# Patient Record
Sex: Male | Born: 1980 | Race: White | Hispanic: No | Marital: Single | Smoking: Former smoker
Health system: Southern US, Community
[De-identification: ages and names within clinical notes are randomized; demographics above are authoritative.]

## PROBLEM LIST (undated history)

## (undated) DIAGNOSIS — F209 Schizophrenia, unspecified: Secondary | ICD-10-CM

## (undated) DIAGNOSIS — F061 Catatonic disorder due to known physiological condition: Secondary | ICD-10-CM

---

## 2010-06-15 ENCOUNTER — Inpatient Hospital Stay (HOSPITAL_COMMUNITY)
Admission: RE | Admit: 2010-06-15 | Discharge: 2010-06-22 | DRG: 885 | Disposition: A | Discharge status: Home / Self Care | Payer: PRIVATE HEALTH INSURANCE | Source: Ambulatory Visit | Attending: Psychiatry | Admitting: Psychiatry

## 2010-06-15 ENCOUNTER — Emergency Department (HOSPITAL_BASED_OUTPATIENT_CLINIC_OR_DEPARTMENT_OTHER)
Admission: EM | Admit: 2010-06-15 | Discharge: 2010-06-15 | Disposition: A | Discharge status: Other Acute Inpatient Hospital | Payer: Self-pay | Attending: Emergency Medicine | Admitting: Emergency Medicine

## 2010-06-15 ENCOUNTER — Emergency Department (INDEPENDENT_AMBULATORY_CARE_PROVIDER_SITE_OTHER): Payer: PRIVATE HEALTH INSURANCE

## 2010-06-15 DIAGNOSIS — R45851 Suicidal ideations: Secondary | ICD-10-CM

## 2010-06-15 DIAGNOSIS — R443 Hallucinations, unspecified: Secondary | ICD-10-CM

## 2010-06-15 DIAGNOSIS — F29 Unspecified psychosis not due to a substance or known physiological condition: Secondary | ICD-10-CM | POA: Insufficient documentation

## 2010-06-15 DIAGNOSIS — H5316 Psychophysical visual disturbances: Secondary | ICD-10-CM | POA: Insufficient documentation

## 2010-06-15 DIAGNOSIS — Z0289 Encounter for other administrative examinations: Secondary | ICD-10-CM

## 2010-06-15 DIAGNOSIS — R51 Headache: Secondary | ICD-10-CM

## 2010-06-15 LAB — DIFFERENTIAL
Lymphs Abs: 1.7 10*3/uL (ref 0.7–4.0)
Monocytes Absolute: 0.4 10*3/uL (ref 0.1–1.0)
Monocytes Relative: 6 % (ref 3–12)
Neutro Abs: 4.6 10*3/uL (ref 1.7–7.7)
Neutrophils Relative %: 67 % (ref 43–77)

## 2010-06-15 LAB — ETHANOL: Alcohol, Ethyl (B): <10 mg/dL (ref 0–10)

## 2010-06-15 LAB — COMPREHENSIVE METABOLIC PANEL
AST: 18 U/L (ref 0–37)
BUN: 16 mg/dL (ref 6–23)
CO2: 27 mEq/L (ref 19–32)
Calcium: 9.5 mg/dL (ref 8.4–10.5)
Chloride: 104 mEq/L (ref 96–112)
Creatinine, Ser: 1.1 mg/dL (ref 0.4–1.5)
GFR calc non Af Amer: >60 mL/min (ref >60)
Glucose, Bld: 90 mg/dL (ref 70–99)
Total Bilirubin: 1.4 mg/dL — ABNORMAL HIGH (ref 0.3–1.2)

## 2010-06-15 LAB — URINALYSIS, ROUTINE W REFLEX MICROSCOPIC
Glucose, UA: NEGATIVE mg/dL
Protein, ur: NEGATIVE mg/dL
Specific Gravity, Urine: 1.009 (ref 1.005–1.030)

## 2010-06-15 LAB — POCT TOXICOLOGY PANEL

## 2010-06-15 LAB — CBC
HCT: 44.9 % (ref 39.0–52.0)
Hemoglobin: 15.5 g/dL (ref 13.0–17.0)
MCH: 29.5 pg (ref 26.0–34.0)
MCHC: 34.5 g/dL (ref 30.0–36.0)
MCV: 85.4 fL (ref 78.0–100.0)
RBC: 5.26 MIL/uL (ref 4.22–5.81)

## 2010-06-16 DIAGNOSIS — F29 Unspecified psychosis not due to a substance or known physiological condition: Secondary | ICD-10-CM

## 2010-06-16 NOTE — H&P (Signed)
NAME:  Tyrone Jenkins, Tyrone Jenkins             ACCOUNT NO.:  [id number]  MEDICAL RECORD NO.:  [id number]           PATIENT TYPE:  I  LOCATION:  0404                          FACILITY:  [facility]  PHYSICIAN:  Eulogio Ditch, MD DATE OF BIRTH:  1981-02-03  DATE OF ADMISSION:  06/15/2010 DATE OF DISCHARGE:                      PSYCHIATRIC ADMISSION ASSESSMENT   HISTORY OF PRESENT ILLNESS:  A 30 year old white male with no past psych hospitalization or history of suicide attempt, not on any psych medications before admission, was admitted because of disorganized behavior for the last 2 months.  The patient was also hearing voices telling him to kill self, telling him that there is no sense of living now.  The patient also reported having racing thoughts and was unable to sleep because of the racing thoughts and the voices.  The patient also reports anxiety and panic attacks sometimes.  The patient was also paranoid and fearful of tow trucks, police, and fire trucks.  The patient also reported visual hallucinations.  On asking about suicidal ideations, he told me that since 2 weeks the voices are telling him to kill self, so he is very fearful about that.  The patient told me that it was very stressful to move from [place] to [place].  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  The patient has also ongoing conflict with the wife.  They have 2 kids, 77 and 31 years old. 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  The patient denies abuse of any drugs.  A UDS was negative at the time of admission.  Denied any history of trauma to the head.  SUBSTANCE ABUSE HISTORY:  None reported.  PAST PSYCH HOSPITALIZATIONS:  None.  CURRENT MEDICAL PROBLEMS:  None.  ALLERGIES:  No known drug allergies.  PHYSICAL EXAMINATION:  Done in Emergency at [facility], within normal limits.  DIAGNOSTIC DATA:  Labs within normal limits.  CT scan was negative.  MENTAL STATUS  EXAM:  The patient is calm, cooperative during the interview.  Fair eye contact.  Was able to make logical conversation. Affect constricted.  Mood depressed.  Thought process:  Logical and goal- directed.  Thought content:  Paranoid delusions positive, on hallucinations telling him to kill self years, fearful of hurting himself.  Cognition:  Alert, awake, oriented x3.  Memory:  Immediate and recent remote fair.  Attention and concentration:  Fair.  Abstract ability:  Fair to poor.  Insight and judgment:  Fair to poor.  DIAGNOSES:  Axis I:  Psychosis not otherwise specified.  Rule out schizophreniform disorder.  Rule out bipolar disorder mixed type with psychotic symptoms. Axis II:  Deferred. Axis III:  No medical issue. Axis IV:  Conflict with the wife.  Stress at the job. Axis V:  GAF 30.  TREATMENT PLAN: 1. The patient was started on Risperdal 1 mg twice a day. 2. I will also start the patient on Klonopin 1 mg twice a day. 3. Estimated length of stay in the hospital will be 4-5 days. 4. We will get more collateral information on this patient.     Eulogio Ditch, MD  SA/MEDQ  D:  06/16/2010  T:  06/16/2010  Job:  [id number]  Electronically Signed by Eulogio Ditch  on 06/16/2010 02:47:37 PM

## 2010-06-18 LAB — LIPID PANEL
Cholesterol: 168 mg/dL (ref 0–200)
HDL: 32 mg/dL — ABNORMAL LOW (ref >39)
LDL Cholesterol: 91 mg/dL (ref 0–99)

## 2010-06-21 DIAGNOSIS — F29 Unspecified psychosis not due to a substance or known physiological condition: Secondary | ICD-10-CM
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
## 2014-01-07 ENCOUNTER — Encounter (HOSPITAL_COMMUNITY): Payer: Self-pay | Admitting: Radiology

## 2014-01-07 ENCOUNTER — Emergency Department (HOSPITAL_COMMUNITY)
Admission: EM | Admit: 2014-01-07 | Discharge: 2014-01-07 | Disposition: A | Discharge status: Home / Self Care | Payer: PRIVATE HEALTH INSURANCE | Attending: Emergency Medicine | Admitting: Emergency Medicine

## 2014-01-07 ENCOUNTER — Emergency Department (HOSPITAL_COMMUNITY): Payer: PRIVATE HEALTH INSURANCE

## 2014-01-07 DIAGNOSIS — R4182 Altered mental status, unspecified: Secondary | ICD-10-CM | POA: Insufficient documentation

## 2014-01-07 DIAGNOSIS — R4789 Other speech disturbances: Secondary | ICD-10-CM | POA: Insufficient documentation

## 2014-01-07 LAB — RAPID URINE DRUG SCREEN, HOSP PERFORMED
Amphetamines: NOT DETECTED
BARBITURATES: NOT DETECTED
Benzodiazepines: POSITIVE — AB
COCAINE: NOT DETECTED
Opiates: NOT DETECTED
TETRAHYDROCANNABINOL: NOT DETECTED

## 2014-01-07 LAB — COMPREHENSIVE METABOLIC PANEL
ALBUMIN: 4.7 g/dL (ref 3.5–5.2)
ALK PHOS: 71 U/L (ref 39–117)
ALT: 23 U/L (ref 0–53)
ANION GAP: 18 — AB (ref 5–15)
AST: 23 U/L (ref 0–37)
BILIRUBIN TOTAL: 1.4 mg/dL — AB (ref 0.3–1.2)
BUN: 21 mg/dL (ref 6–23)
CHLORIDE: 95 meq/L — AB (ref 96–112)
CO2: 24 meq/L (ref 19–32)
CREATININE: 1.06 mg/dL (ref 0.50–1.35)
Calcium: 9.8 mg/dL (ref 8.4–10.5)
GFR calc Af Amer: >90 mL/min (ref >90)
GLUCOSE: 87 mg/dL (ref 70–99)
POTASSIUM: 3.7 meq/L (ref 3.7–5.3)
Sodium: 137 mEq/L (ref 137–147)
Total Protein: 8 g/dL (ref 6.0–8.3)

## 2014-01-07 LAB — CBC WITH DIFFERENTIAL/PLATELET
BASOS PCT: 0 % (ref 0–1)
Basophils Absolute: 0 10*3/uL (ref 0.0–0.1)
Eosinophils Absolute: 0.1 10*3/uL (ref 0.0–0.7)
Eosinophils Relative: 1 % (ref 0–5)
HEMATOCRIT: 46 % (ref 39.0–52.0)
HEMOGLOBIN: 16 g/dL (ref 13.0–17.0)
LYMPHS ABS: 2.8 10*3/uL (ref 0.7–4.0)
LYMPHS PCT: 28 % (ref 12–46)
MCH: 30.7 pg (ref 26.0–34.0)
MCHC: 34.8 g/dL (ref 30.0–36.0)
MCV: 88.3 fL (ref 78.0–100.0)
MONO ABS: 0.9 10*3/uL (ref 0.1–1.0)
MONOS PCT: 9 % (ref 3–12)
NEUTROS ABS: 6.2 10*3/uL (ref 1.7–7.7)
NEUTROS PCT: 62 % (ref 43–77)
Platelets: 279 10*3/uL (ref 150–400)
RBC: 5.21 MIL/uL (ref 4.22–5.81)
RDW: 13.5 % (ref 11.5–15.5)
WBC: 10 10*3/uL (ref 4.0–10.5)

## 2014-01-07 LAB — URINALYSIS, ROUTINE W REFLEX MICROSCOPIC
Glucose, UA: NEGATIVE mg/dL
Leukocytes, UA: NEGATIVE
NITRITE: NEGATIVE
Protein, ur: 30 mg/dL — AB
SPECIFIC GRAVITY, URINE: 1.038 — AB (ref 1.005–1.030)
UROBILINOGEN UA: 1 mg/dL (ref 0.0–1.0)
pH: 5.5 (ref 5.0–8.0)

## 2014-01-07 LAB — ETHANOL: Alcohol, Ethyl (B): <11 mg/dL (ref 0–11)

## 2014-01-07 LAB — I-STAT CG4 LACTIC ACID, ED: Lactic Acid, Venous: 0.8 mmol/L (ref 0.5–2.2)

## 2014-01-07 LAB — AMMONIA: AMMONIA: 10 umol/L — AB (ref 11–60)

## 2014-01-07 LAB — URINE MICROSCOPIC-ADD ON

## 2014-01-07 MED ORDER — SODIUM CHLORIDE 0.9 % IV BOLUS (SEPSIS)
1000.0000 mL | Freq: Once | INTRAVENOUS | Status: AC
  Administered 2014-01-07: 1000 mL via INTRAVENOUS

## 2014-01-07 MED ORDER — SODIUM CHLORIDE 0.9 % IV SOLN
INTRAVENOUS | Status: DC
  Administered 2014-01-07: 19:00:00 via INTRAVENOUS

## 2014-01-07 MED ORDER — ACETAMINOPHEN 325 MG PO TABS
650.0000 mg | ORAL_TABLET | Freq: Once | ORAL | Status: AC
  Administered 2014-01-07: 650 mg via ORAL
  Filled 2014-01-07: qty 2

## 2014-01-07 NOTE — ED Notes (Signed)
Bed: [facility] Expected date:  Expected time:  Means of arrival:  Comments: [facility] 
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
## 2014-01-07 NOTE — ED Notes (Signed)
Upon going in room to discharge pt, pt requested to speak to Dr Freida BusmanAllen prior to discharge. Dr Freida BusmanAllen went in room, per Dr Freida BusmanAllen pt stated he was ready to be discharged. When nurse went in room, pt was very agitated, refused to sign for paperwork, and states he's upset because he doesn't know what's going on and he was told by his girlfriend that he was going to be admitted over night.

## 2014-01-07 NOTE — ED Notes (Signed)
Pt answering some questions at this time, when asked if he knows where he is he says "yes," but is unable to specify where he he. When asked about pain pt states "my whole body hurts." Pt not answering questions r/t situation/ what happened today. Pt's girlfriend at bedside, states that he went in to work this morning, boss sent him home because "he was acting out of it." When she went home to check on him, he was very tense, not answering any questions. Girlfriend states pt has "been acting out of it for the past few days."

## 2014-01-07 NOTE — Discharge Instructions (Signed)
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
## 2014-01-18 ENCOUNTER — Encounter (HOSPITAL_COMMUNITY): Payer: Self-pay | Admitting: Emergency Medicine

## 2014-01-18 ENCOUNTER — Emergency Department (HOSPITAL_COMMUNITY)
Admission: EM | Admit: 2014-01-18 | Discharge: 2014-01-19 | Disposition: A | Discharge status: Other Acute Inpatient Hospital | Payer: Self-pay | Attending: Emergency Medicine | Admitting: Emergency Medicine

## 2014-01-18 DIAGNOSIS — F209 Schizophrenia, unspecified: Secondary | ICD-10-CM

## 2014-01-18 DIAGNOSIS — F3130 Bipolar disorder, current episode depressed, mild or moderate severity, unspecified: Secondary | ICD-10-CM

## 2014-01-18 DIAGNOSIS — F315 Bipolar disorder, current episode depressed, severe, with psychotic features: Secondary | ICD-10-CM | POA: Diagnosis present

## 2014-01-18 DIAGNOSIS — Z79899 Other long term (current) drug therapy: Secondary | ICD-10-CM | POA: Insufficient documentation

## 2014-01-18 HISTORY — DX: Schizophrenia, unspecified: F20.9

## 2014-01-18 LAB — CBC WITH DIFFERENTIAL/PLATELET
BASOS ABS: 0 10*3/uL (ref 0.0–0.1)
Basophils Relative: 0 % (ref 0–1)
Eosinophils Absolute: 0.1 10*3/uL (ref 0.0–0.7)
Eosinophils Relative: 1 % (ref 0–5)
HEMATOCRIT: 40.9 % (ref 39.0–52.0)
Hemoglobin: 14.2 g/dL (ref 13.0–17.0)
LYMPHS ABS: 2.6 10*3/uL (ref 0.7–4.0)
LYMPHS PCT: 29 % (ref 12–46)
MCH: 30 pg (ref 26.0–34.0)
MCHC: 34.7 g/dL (ref 30.0–36.0)
MCV: 86.3 fL (ref 78.0–100.0)
Monocytes Absolute: 1 10*3/uL (ref 0.1–1.0)
Monocytes Relative: 11 % (ref 3–12)
NEUTROS ABS: 5.5 10*3/uL (ref 1.7–7.7)
Neutrophils Relative %: 59 % (ref 43–77)
PLATELETS: 289 10*3/uL (ref 150–400)
RBC: 4.74 MIL/uL (ref 4.22–5.81)
RDW: 13.6 % (ref 11.5–15.5)
WBC: 9.2 10*3/uL (ref 4.0–10.5)

## 2014-01-18 LAB — COMPREHENSIVE METABOLIC PANEL
ALBUMIN: 4.1 g/dL (ref 3.5–5.2)
ALT: 22 U/L (ref 0–53)
ANION GAP: 15 (ref 5–15)
AST: 22 U/L (ref 0–37)
Alkaline Phosphatase: 60 U/L (ref 39–117)
BILIRUBIN TOTAL: 1 mg/dL (ref 0.3–1.2)
BUN: 16 mg/dL (ref 6–23)
CALCIUM: 9.3 mg/dL (ref 8.4–10.5)
CHLORIDE: 99 meq/L (ref 96–112)
CO2: 26 mEq/L (ref 19–32)
Creatinine, Ser: 0.9 mg/dL (ref 0.50–1.35)
GFR calc Af Amer: >90 mL/min (ref >90)
Glucose, Bld: 103 mg/dL — ABNORMAL HIGH (ref 70–99)
Potassium: 3.3 mEq/L — ABNORMAL LOW (ref 3.7–5.3)
Sodium: 140 mEq/L (ref 137–147)
Total Protein: 6.9 g/dL (ref 6.0–8.3)

## 2014-01-18 LAB — RAPID URINE DRUG SCREEN, HOSP PERFORMED
Amphetamines: NOT DETECTED
Barbiturates: NOT DETECTED
Benzodiazepines: NOT DETECTED
Cocaine: NOT DETECTED
Opiates: NOT DETECTED
TETRAHYDROCANNABINOL: NOT DETECTED

## 2014-01-18 LAB — ETHANOL: Alcohol, Ethyl (B): <11 mg/dL (ref 0–11)

## 2014-01-18 MED ORDER — ONDANSETRON HCL 4 MG PO TABS: 4.0000 mg | ORAL_TABLET | Freq: Three times a day (TID) | ORAL | Status: DC | PRN

## 2014-01-18 MED ORDER — ALUM & MAG HYDROXIDE-SIMETH 200-200-20 MG/5ML PO SUSP: 30.0000 mL | ORAL | Status: DC | PRN

## 2014-01-18 MED ORDER — ACETAMINOPHEN 325 MG PO TABS
650.0000 mg | ORAL_TABLET | ORAL | Status: DC | PRN
Start: 2014-01-18 — End: 2014-01-19

## 2014-01-18 MED ORDER — LORAZEPAM 1 MG PO TABS
1.0000 mg | ORAL_TABLET | Freq: Three times a day (TID) | ORAL | Status: DC | PRN
  Administered 2014-01-18 – 2014-01-19 (×2): 1 mg via ORAL
  Filled 2014-01-18 (×2): qty 1

## 2014-01-18 MED ORDER — POTASSIUM CHLORIDE CRYS ER 20 MEQ PO TBCR
40.0000 meq | EXTENDED_RELEASE_TABLET | Freq: Once | ORAL | Status: AC
  Administered 2014-01-18: 40 meq via ORAL
  Filled 2014-01-18: qty 2

## 2014-01-18 MED ORDER — RISPERIDONE 1 MG PO TABS
1.0000 mg | ORAL_TABLET | Freq: Every day | ORAL | Status: DC
  Administered 2014-01-18: 1 mg via ORAL
  Filled 2014-01-18: qty 1

## 2014-01-18 MED ORDER — IBUPROFEN 200 MG PO TABS: 600.0000 mg | ORAL_TABLET | Freq: Three times a day (TID) | ORAL | Status: DC | PRN

## 2014-01-18 MED ORDER — NICOTINE 21 MG/24HR TD PT24: 21.0000 mg | MEDICATED_PATCH | Freq: Every day | TRANSDERMAL | Status: DC

## 2014-01-18 NOTE — ED Provider Notes (Signed)
CSN: [id number]     Arrival date & time 01/18/14  0403 History   First MD Initiated Contact with Patient 01/18/14 [phone]     Chief Complaint  Patient presents with  . Medical Clearance    patient is in a catatonic state     (Consider location/radiation/quality/duration/timing/severity/associated sxs/prior Treatment) HPI 33 year old male presents to the emergency department with his mother who reports change in behavior.  Patient has history of schizophrenia 3 years ago, no longer on medications.  Patient will nod yes or no to questions.  Is noted to be tensing his arms and legs and moving his mouth without sounds occasionally gritting his teeth.  Patient nods his head yes when asked if he is sad.  He denies pain.  He denies being on medication.  Patient's mother reports symptoms started about 2 weeks ago when he was unable to see his children over the weekend.  His children live with his wife who is in [place].  Family went to visit him, and noticed that he was extremely emotional and overwrought and could not speak.  Patient went to work the next day, was sent home as he was unable to speak.  He is currently been on medical leave for the last 2 weeks due to his emotional state.  Mother reports that he is not eating well.  He intermittently speaks to family but only in limited amounts.  They attempted taken to get psychiatric help twice this week, but patient left the facility both times.  They attempted to get patient IVC'd, but the magistrate refused their petition.  Patient is not suicidal or homicidal.  Patient has been picked up by the sheriff's department twice this week once while walking in the rain, another time when he went to a prior residence of his and was trying to get into the house.  Mother reports similar episodes 3 years ago when he was diagnosed with schizophrenia.  She feels that he needs to get back on his medication. Past Medical History  Diagnosis Date  . Schizophrenia     History reviewed. No pertinent past surgical history. No family history on file. History  Substance Use Topics  . Smoking status: Never Smoker   . Smokeless tobacco: Not on file  . Alcohol Use: No    Review of Systems  Unable to perform ROS: Psychiatric disorder      Allergies  Review of patient's allergies indicates no known allergies.  Home Medications   Prior to Admission medications   Medication Sig Start Date End Date Taking? Authorizing Provider  Multiple Vitamin (MULTIVITAMIN WITH MINERALS) TABS tablet Take 1 tablet by mouth daily.   Yes Historical Provider, MD   BP 127/80  Pulse 93  Temp(Src) 98.1 F (36.7 C) (Oral)  Resp 18  SpO2 100% Physical Exam  Nursing note and vitals reviewed. Constitutional: He appears well-developed and well-nourished. He appears distressed.  HENT:  Head: Normocephalic and atraumatic.  Nose: Nose normal.  Mouth/Throat: Oropharynx is clear and moist.  Eyes: Conjunctivae and EOM are normal. Pupils are equal, round, and reactive to light.  Neck: Normal range of motion. Neck supple. No JVD present. No tracheal deviation present. No thyromegaly present.  Cardiovascular: Normal rate, regular rhythm, normal heart sounds and intact distal pulses.  Exam reveals no gallop and no friction rub.   No murmur heard. Pulmonary/Chest: Effort normal and breath sounds normal. No stridor. No respiratory distress. He has no wheezes. He has no rales. He exhibits no tenderness.  Abdominal: Soft. Bowel sounds are normal. He exhibits no distension and no mass. There is no tenderness. There is no rebound and no guarding.  Musculoskeletal: Normal range of motion. He exhibits no edema and no tenderness.  Lymphadenopathy:    He has no cervical adenopathy.  Neurological: He is alert. He displays normal reflexes. No cranial nerve deficit. He exhibits normal muscle tone. Coordination (patient is holding arms and legs rigidly.  Foot arms removed, they stayed in  position but with notable rigidity) abnormal.  Skin: Skin is warm and dry. No rash noted. No erythema. No pallor.  Psychiatric:  Patient with grinding of teeth, refuses to answer questions.  Able to answer yes or no questions by shaking head.  Reports depression.    ED Course  Procedures (including critical care time) Labs Review Labs Reviewed  COMPREHENSIVE METABOLIC PANEL - Abnormal; Notable for the following:    Potassium 3.3 (*)    Glucose, Bld 103 (*)    All other components within normal limits  CBC WITH DIFFERENTIAL  ETHANOL  URINE RAPID DRUG SCREEN (HOSP PERFORMED)    Imaging Review No results found.   EKG Interpretation None      MDM   Final diagnoses:  Schizophrenia, unspecified type  Adult behavior problems  Adjustment disorder with other symptom    33 year old male with persistent bizarre behavior over the last 2 weeks history of schizophrenia.  Will have patient evaluated by TTS and psychiatry  Olivia Mackielga M Barb Shear, MD 01/18/14 0600

## 2014-01-18 NOTE — ED Notes (Signed)
Pt nodding head yes or no when answering questions, having trouble when trying to speak, pt denies shortness of breath, denies headache or abdominal pain, pt nods yes to being sad, pt nods yes to having this happen before.

## 2014-01-18 NOTE — ED Notes (Signed)
Patient is unable to answer screening questions at this time.

## 2014-01-18 NOTE — ED Notes (Signed)
Pt wanded by security, 1 bag of pt belongings. Attempted to call report to [facility], stated will take pt after 7 am.

## 2014-01-18 NOTE — Progress Notes (Signed)
Pt has been assessed and meets inpatient criteria for treatment. Pt clinicals faxed out to:   [facility]  [facility]  [facility]  [place]  [facility]  [facility]   Will continue to seek placement.  Derrell Lollingoris Ziair Penson, MSW  Social Worker  [phone]

## 2014-01-18 NOTE — ED Notes (Signed)
Pt stated he was not having thoughts to harm others so he could go home now, also voiced feeling better. Ate all of dinner, less irritable. Maintained on q 15 minute checks.

## 2014-01-18 NOTE — ED Notes (Signed)
Patient arrives accompanied by his mother. Patient is not answering questions, is rigid, and in a catatonic like state. Patient's mother states the patient has had some increased emotional stress over the past 2 weeks in relation to his children's mother prohibiting him from seeing his children. Patient was seen on 01/07/2014 for similar episode. Patient has a history of schizophrenia and to the mother's knowledge patient is not taking any medications. Patient mother states on 01/07/2014 she was told he was dehydrated.

## 2014-01-18 NOTE — ED Notes (Signed)
Transfer note: [facility]

## 2014-01-18 NOTE — ED Notes (Signed)
Transfer note : [facility], report called . Pt has been experiencing catonia like symptoms since mom has been caring for  his kids. Pt has been pacing in hallway, nonverbal to staff. Refused prn Ativan, food and beverage. Maintain on q 15 minute checks

## 2014-01-18 NOTE — ED Notes (Signed)
Pt. In scrubs. Pt. And belongings searched and wanded by security. Pt. Has 1 belongings bag. Pt. Has sweat pants, t-shirts, flip flops. Pt. Belongings locked up at the nurses station in the yellow zone.

## 2014-01-18 NOTE — ED Notes (Signed)
Pt instructed to give urine sample, given urinal, pt able to sit on side of bed w/o difficulty and by self, pt still did not speak.

## 2014-01-18 NOTE — Consult Note (Signed)
[facility] Face-to-Face Psychiatry Consult   Reason for Consult:  [place] Referring Physician:  EDP  Edmund Holcomb is an 33 y.o. male. Total Time spent with patient: 45 minutes  Assessment: AXIS I:  Bipolar, Depressed AXIS II:  Deferred AXIS III:   Past Medical History  Diagnosis Date  . Schizophrenia    AXIS IV:  other psychosocial or environmental problems, problems related to social environment and problems with primary support group AXIS V:  21-30 behavior considerably influenced by delusions or hallucinations OR serious impairment in judgment, communication OR inability to function in almost all areas  Plan:  Recommend psychiatric Inpatient admission when medically cleared.  Subjective:   Lamont Tant is a 33 y.o. male patient admitted with bipolar with psychosis.  HPI:  The patient was brought to the ED by his mother and was in a catatonic state.  When we assessed him today, he was pacing and agitated, clearly responding to internal stimuli but denies this.  He does say he is having trouble talking, thought blocking, and paranoia.  Hudsen reports headaches and on a LOA from work due to these headaches.  Denies suicidal/homicidal ideations and alcohol/drug use.  He refuses to talk about his exwife and children, "Thats private, I don't want to talk about it." HPI Elements:   Location:  generalized. Quality:  acute. Severity:  severe. Timing:  constant. Duration:  last two weeks. Context:  stress with exwife and her not allowing him to see their children.  Past Psychiatric History: Past Medical History  Diagnosis Date  . Schizophrenia     reports that he has never smoked. He does not have any smokeless tobacco history on file. He reports that he does not drink alcohol or use illicit drugs. History reviewed. No pertinent family history.         Allergies:  No Known Allergies  ACT Assessment Complete:  Yes:    Educational Status    Risk to Self: Risk to self with the  past 6 months Substance abuse history and/or treatment for substance abuse?: No  Risk to Others:    Abuse:    Prior Inpatient Therapy:    Prior Outpatient Therapy:    Additional Information:                    Objective: Blood pressure 132/80, pulse 105, temperature 98.3 F (36.8 C), temperature source Oral, resp. rate 18, SpO2 100.00%.There is no height or weight on file to calculate BMI. Results for orders placed during the hospital encounter of 01/18/14 (from the past 72 hour(s))  CBC WITH DIFFERENTIAL     Status: None   Collection Time    01/18/14  4:45 AM      Result Value Ref Range   WBC 9.2  4.0 - 10.5 K/uL   RBC 4.74  4.22 - 5.81 MIL/uL   Hemoglobin 14.2  13.0 - 17.0 g/dL   HCT 40.9  39.0 - 52.0 %   MCV 86.3  78.0 - 100.0 fL   MCH 30.0  26.0 - 34.0 pg   MCHC 34.7  30.0 - 36.0 g/dL   RDW 13.6  11.5 - 15.5 %   Platelets 289  150 - 400 K/uL   Neutrophils Relative % 59  43 - 77 %   Neutro Abs 5.5  1.7 - 7.7 K/uL   Lymphocytes Relative 29  12 - 46 %   Lymphs Abs 2.6  0.7 - 4.0 K/uL   Monocytes Relative 11  3 -  12 %   Monocytes Absolute 1.0  0.1 - 1.0 K/uL   Eosinophils Relative 1  0 - 5 %   Eosinophils Absolute 0.1  0.0 - 0.7 K/uL   Basophils Relative 0  0 - 1 %   Basophils Absolute 0.0  0.0 - 0.1 K/uL  COMPREHENSIVE METABOLIC PANEL     Status: Abnormal   Collection Time    01/18/14  4:45 AM      Result Value Ref Range   Sodium 140  137 - 147 mEq/L   Potassium 3.3 (*) 3.7 - 5.3 mEq/L   Chloride 99  96 - 112 mEq/L   CO2 26  19 - 32 mEq/L   Glucose, Bld 103 (*) 70 - 99 mg/dL   BUN 16  6 - 23 mg/dL   Creatinine, Ser 0.90  0.50 - 1.35 mg/dL   Calcium 9.3  8.4 - 10.5 mg/dL   Total Protein 6.9  6.0 - 8.3 g/dL   Albumin 4.1  3.5 - 5.2 g/dL   AST 22  0 - 37 U/L   ALT 22  0 - 53 U/L   Alkaline Phosphatase 60  39 - 117 U/L   Total Bilirubin 1.0  0.3 - 1.2 mg/dL   GFR calc non Af Amer >90  >90 mL/min   GFR calc Af Amer >90  >90 mL/min   Comment: (NOTE)      The eGFR has been calculated using the CKD EPI equation.     This calculation has not been validated in all clinical situations.     eGFR's persistently <90 mL/min signify possible Chronic Kidney     Disease.   Anion gap 15  5 - 15  ETHANOL     Status: None   Collection Time    01/18/14  4:45 AM      Result Value Ref Range   Alcohol, Ethyl (B) <11  0 - 11 mg/dL   Comment:            LOWEST DETECTABLE LIMIT FOR     SERUM ALCOHOL IS 11 mg/dL     FOR MEDICAL PURPOSES ONLY  URINE RAPID DRUG SCREEN (HOSP PERFORMED)     Status: None   Collection Time    01/18/14  5:40 AM      Result Value Ref Range   Opiates NONE DETECTED  NONE DETECTED   Cocaine NONE DETECTED  NONE DETECTED   Benzodiazepines NONE DETECTED  NONE DETECTED   Amphetamines NONE DETECTED  NONE DETECTED   Tetrahydrocannabinol NONE DETECTED  NONE DETECTED   Barbiturates NONE DETECTED  NONE DETECTED   Comment:            DRUG SCREEN FOR MEDICAL PURPOSES     ONLY.  IF CONFIRMATION IS NEEDED     FOR ANY PURPOSE, NOTIFY LAB     WITHIN 5 DAYS.                LOWEST DETECTABLE LIMITS     FOR URINE DRUG SCREEN     Drug Class       Cutoff (ng/mL)     Amphetamine      1000     Barbiturate      200     Benzodiazepine   401     Tricyclics       027     Opiates          300     Cocaine  300     THC              50   Labs are reviewed and are pertinent for no medical issues noted.  Current Facility-Administered Medications  Medication Dose Route Frequency Provider Last Rate Last Dose  . acetaminophen (TYLENOL) tablet 650 mg  650 mg Oral Q4H PRN Kalman Drape, MD      . alum & mag hydroxide-simeth (MAALOX/MYLANTA) 200-200-20 MG/5ML suspension 30 mL  30 mL Oral PRN Kalman Drape, MD      . ibuprofen (ADVIL,MOTRIN) tablet 600 mg  600 mg Oral Q8H PRN Kalman Drape, MD      . LORazepam (ATIVAN) tablet 1 mg  1 mg Oral Q8H PRN Kalman Drape, MD   1 mg at 01/18/14 8416  . nicotine (NICODERM CQ - dosed in mg/24 hours) patch 21 mg   21 mg Transdermal Daily Kalman Drape, MD      . ondansetron [facility]) tablet 4 mg  4 mg Oral Q8H PRN Kalman Drape, MD      . risperiDONE (RISPERDAL) tablet 1 mg  1 mg Oral QHS Waylan Boga, NP       Current Outpatient Prescriptions  Medication Sig Dispense Refill  . Multiple Vitamin (MULTIVITAMIN WITH MINERALS) TABS tablet Take 1 tablet by mouth daily.        Psychiatric Specialty Exam:     Blood pressure 132/80, pulse 105, temperature 98.3 F (36.8 C), temperature source Oral, resp. rate 18, SpO2 100.00%.There is no height or weight on file to calculate BMI.  General Appearance: Disheveled  Eye Sport and exercise psychologist::  Fair  Speech:  Normal Rate  Volume:  Normal  Mood:  Anxious and Irritable  Affect:  Blunt  Thought Process:  Coherent but thought blocking  Orientation:  Full (Time, Place, and Person)  Thought Content:  Hallucinations: Auditory Visual and Paranoid Ideation  Suicidal Thoughts:  No  Homicidal Thoughts:  No  Memory:  Immediate;   Fair Recent;   Poor Remote;   Poor  Judgement:  Impaired  Insight:  Lacking  Psychomotor Activity:  Increased  Concentration:  Fair  Recall:  Poor  Fund of Knowledge:Fair  Language: Fair  Akathisia:  No  Handed:  Right  AIMS (if indicated):     Assets:  Housing Leisure Time Physical Health Resilience Social Support Vocational/Educational  Sleep:      Musculoskeletal: Strength & Muscle Tone: within normal limits Gait & Station: normal Patient leans: N/A  Treatment Plan Summary: Daily contact with patient to assess and evaluate symptoms and progress in treatment Medication management; Risperdal 1 mg at bedtime and Ativan for anxiety PRN 1 mg, admit to inpatient psychiatry  Waylan Boga, [place] 01/18/2014 5:12 PM  Patient seen, evaluated and I agree with notes by Nurse Practitioner. Corena Pilgrim, MD

## 2014-01-18 NOTE — ED Notes (Signed)
At bedside w/ EDP, pt not speaking, pt looking around room, mother at bedside, pt tense while blood being drawn.

## 2014-01-18 NOTE — ED Notes (Signed)
Pt resting on stretcher at present, Awake, alert & responsive, will continue to monitor for safety.

## 2014-01-19 DIAGNOSIS — F319 Bipolar disorder, unspecified: Secondary | ICD-10-CM

## 2014-01-19 DIAGNOSIS — F29 Unspecified psychosis not due to a substance or known physiological condition: Secondary | ICD-10-CM

## 2014-01-19 NOTE — ED Notes (Signed)
Sheriffs dept called back and stated I needed to call [employer] for transportation. [employer] notified.

## 2014-01-19 NOTE — Consult Note (Signed)
Psychiatry Follow Up Note     Tyrone Jenkins is an 33 y.o. male. Total Time spent with patient: 20 minutes  Assessment: AXIS I:  Psychotic Disorder NOS, bipolar disorder with psychotic features.   AXIS II:  Deferred AXIS III:   Past Medical History  Diagnosis Date  . Schizophrenia    AXIS IV:  other psychosocial or environmental problems, problems related to social environment and problems with primary support group AXIS V:  21-30 behavior considerably influenced by delusions or hallucinations OR serious impairment in judgment, communication OR inability to function in almost all areas  Plan:  Recommend psychiatric Inpatient admission when medically cleared.  Subjective:   Tyrone Jenkins is a 33 y.o. male patient admitted with bipolar with psychosis.  HPI:  Patient seen chart reviewed.  He is 33 year old Caucasian man who was initially brought into the emergency room by his mother because he was in a catatonic state.  He refused to cooperate yesterday and appears responding to internal stimuli.  Today he remains very guarded, paranoid and hypervigilant.  He refused to provide any information however when I talk about recent stressors related to his children's mother prohibiting him for visitation , he get more upset.  Patient has history of psychosis and he was admitted to behavioral [facility] in 2012.  He was discharged on Risperdal.  Apparently patient is not taking Risperdal .  He was given 1 dose last night but he does not believe he has any psychiatric illness.  He refuses to give [place] a permission to contact his family members.  Patient has thought blocking, paranoia and he has difficulty organizing his thoughts.  Patient told he was dizzy but unable to provide more information.  Patient told everything his private and I don't want to talk about it.  In the room he is very isolated withdrawn and limited to himself.  Past Psychiatric History: Past Medical History  Diagnosis Date   . Schizophrenia     reports that he has never smoked. He does not have any smokeless tobacco history on file. He reports that he does not drink alcohol or use illicit drugs. History reviewed. No pertinent family history.         Allergies:  No Known Allergies  ACT Assessment Complete:  Yes:    Educational Status    Risk to Self: Risk to self with the past 6 months Substance abuse history and/or treatment for substance abuse?:  (UDS negative    BAL<11)  Risk to Others:    Abuse:    Prior Inpatient Therapy:    Prior Outpatient Therapy:    Additional Information:                    Objective: Blood pressure 117/77, pulse 114, temperature 98.2 F (36.8 C), temperature source Oral, resp. rate 18, SpO2 99.00%.There is no height or weight on file to calculate BMI. Results for orders placed during the hospital encounter of 01/18/14 (from the past 72 hour(s))  CBC WITH DIFFERENTIAL     Status: None   Collection Time    01/18/14  4:45 AM      Result Value Ref Range   WBC 9.2  4.0 - 10.5 K/uL   RBC 4.74  4.22 - 5.81 MIL/uL   Hemoglobin 14.2  13.0 - 17.0 g/dL   HCT 40.9  39.0 - 52.0 %   MCV 86.3  78.0 - 100.0 fL   MCH 30.0  26.0 - 34.0 pg  MCHC 34.7  30.0 - 36.0 g/dL   RDW 13.6  11.5 - 15.5 %   Platelets 289  150 - 400 K/uL   Neutrophils Relative % 59  43 - 77 %   Neutro Abs 5.5  1.7 - 7.7 K/uL   Lymphocytes Relative 29  12 - 46 %   Lymphs Abs 2.6  0.7 - 4.0 K/uL   Monocytes Relative 11  3 - 12 %   Monocytes Absolute 1.0  0.1 - 1.0 K/uL   Eosinophils Relative 1  0 - 5 %   Eosinophils Absolute 0.1  0.0 - 0.7 K/uL   Basophils Relative 0  0 - 1 %   Basophils Absolute 0.0  0.0 - 0.1 K/uL  COMPREHENSIVE METABOLIC PANEL     Status: Abnormal   Collection Time    01/18/14  4:45 AM      Result Value Ref Range   Sodium 140  137 - 147 mEq/L   Potassium 3.3 (*) 3.7 - 5.3 mEq/L   Chloride 99  96 - 112 mEq/L   CO2 26  19 - 32 mEq/L   Glucose, Bld 103 (*) 70 - 99 mg/dL    BUN 16  6 - 23 mg/dL   Creatinine, Ser 0.90  0.50 - 1.35 mg/dL   Calcium 9.3  8.4 - 10.5 mg/dL   Total Protein 6.9  6.0 - 8.3 g/dL   Albumin 4.1  3.5 - 5.2 g/dL   AST 22  0 - 37 U/L   ALT 22  0 - 53 U/L   Alkaline Phosphatase 60  39 - 117 U/L   Total Bilirubin 1.0  0.3 - 1.2 mg/dL   GFR calc non Af Amer >90  >90 mL/min   GFR calc Af Amer >90  >90 mL/min   Comment: (NOTE)     The eGFR has been calculated using the CKD EPI equation.     This calculation has not been validated in all clinical situations.     eGFR's persistently <90 mL/min signify possible Chronic Kidney     Disease.   Anion gap 15  5 - 15  ETHANOL     Status: None   Collection Time    01/18/14  4:45 AM      Result Value Ref Range   Alcohol, Ethyl (B) <11  0 - 11 mg/dL   Comment:            LOWEST DETECTABLE LIMIT FOR     SERUM ALCOHOL IS 11 mg/dL     FOR MEDICAL PURPOSES ONLY  URINE RAPID DRUG SCREEN (HOSP PERFORMED)     Status: None   Collection Time    01/18/14  5:40 AM      Result Value Ref Range   Opiates NONE DETECTED  NONE DETECTED   Cocaine NONE DETECTED  NONE DETECTED   Benzodiazepines NONE DETECTED  NONE DETECTED   Amphetamines NONE DETECTED  NONE DETECTED   Tetrahydrocannabinol NONE DETECTED  NONE DETECTED   Barbiturates NONE DETECTED  NONE DETECTED   Comment:            DRUG SCREEN FOR MEDICAL PURPOSES     ONLY.  IF CONFIRMATION IS NEEDED     FOR ANY PURPOSE, NOTIFY LAB     WITHIN 5 DAYS.                LOWEST DETECTABLE LIMITS     FOR URINE DRUG SCREEN     Drug Class  Cutoff (ng/mL)     Amphetamine      1000     Barbiturate      200     Benzodiazepine   937     Tricyclics       342     Opiates          300     Cocaine          300     THC              50   Labs are reviewed and are pertinent for no medical issues noted.  Current Facility-Administered Medications  Medication Dose Route Frequency Provider Last Rate Last Dose  . acetaminophen (TYLENOL) tablet 650 mg  650 mg Oral  Q4H PRN Kalman Drape, MD      . alum & mag hydroxide-simeth (MAALOX/MYLANTA) 200-200-20 MG/5ML suspension 30 mL  30 mL Oral PRN Kalman Drape, MD      . ibuprofen (ADVIL,MOTRIN) tablet 600 mg  600 mg Oral Q8H PRN Kalman Drape, MD      . LORazepam (ATIVAN) tablet 1 mg  1 mg Oral Q8H PRN Kalman Drape, MD   1 mg at 01/19/14 0054  . nicotine (NICODERM CQ - dosed in mg/24 hours) patch 21 mg  21 mg Transdermal Daily Kalman Drape, MD      . ondansetron [facility]) tablet 4 mg  4 mg Oral Q8H PRN Kalman Drape, MD      . risperiDONE (RISPERDAL) tablet 1 mg  1 mg Oral QHS Waylan Boga, NP   1 mg at 01/18/14 2203   Current Outpatient Prescriptions  Medication Sig Dispense Refill  . Multiple Vitamin (MULTIVITAMIN WITH MINERALS) TABS tablet Take 1 tablet by mouth daily.        Psychiatric Specialty Exam:     Blood pressure 117/77, pulse 114, temperature 98.2 F (36.8 C), temperature source Oral, resp. rate 18, SpO2 99.00%.There is no height or weight on file to calculate BMI.  General Appearance: Disheveled and Guarded  Engineer, water::  Fair  Speech:  Normal Rate and Slow  Volume:  Normal  Mood:  Anxious and Irritable  Affect:  Blunt  Thought Process:  Coherent but thought blocking  Orientation:  Full (Time, Place, and Person)  Thought Content:  Hallucinations: Auditory Denies but appeared to responding to endorse stimuli and Paranoid Ideation  Suicidal Thoughts:  No  Homicidal Thoughts:  No  Memory:  Immediate;   Fair Recent;   Poor Remote;   Poor  Judgement:  Impaired  Insight:  Lacking  Psychomotor Activity:  Increased  Concentration:  Fair  Recall:  Poor  Fund of Knowledge:Fair  Language: Fair  Akathisia:  No  Handed:  Right  AIMS (if indicated):     Assets:  Housing Leisure Time Physical Health Resilience Social Support Vocational/Educational  Sleep:      Musculoskeletal: Strength & Muscle Tone: within normal limits Gait & Station: normal Patient leans: N/A  Treatment Plan  Summary: Daily contact with patient to assess and evaluate symptoms and progress in treatment Medication management; Risperdal to help his psychosis and Ativan for anxiety PRN 1 mg, patient requires inpatient psychiatric treatment for stabilization.  Patient accepted at [facility]  01/19/2014 12:49 PM

## 2014-01-19 NOTE — ED Notes (Signed)
Supplies given to shower.

## 2014-01-19 NOTE — ED Notes (Signed)
Report called to [facility] transport managerCindy RN at [facility].

## 2014-01-19 NOTE — ED Notes (Addendum)
Called [facility] to update reason for delay. Explained he had been IVC'd and we were now waiting to hear from [place] [employer]. Will touch base when sheriff calls back.

## 2014-01-19 NOTE — ED Notes (Signed)
[employer] here to transport to [facility]. Patient remains nonverbal with me. Ambulatory without difficulty. Cooperative with officer. Belongings bag x1 given to officer.

## 2014-01-19 NOTE — Progress Notes (Signed)
1:32pm. CSW faxed IVC paperwork to Magistrate. Per Betsy CoderMagistrate Jacobs, paperwork looks good and sheriff to be dispatched.  Mariann LasterAlexandra Kyona Chauncey LCSWA,     ED CSW  phone: [phone]

## 2014-01-19 NOTE — ED Notes (Signed)
Not talking-paranoid. Pacing at times or sitting on bed with his eyes closed. No aggressive behavior observed.

## 2014-01-19 NOTE — BH Assessment (Signed)
[facility] Assessment Progress Note    Update:  [facility] Dr. Jeannine KittenFarah.  Number for nurse to nurse report is [phone].  Updated pt's nurse, Marcelino DusterMichelle as well as TTS staff.  Casimer LaniusKristen Deronda Christian, MS, [facility] Licensed Professional Counselor Therapeutic Triage Specialist [facility] Phone: [phone] Fax: [phone]

## 2019-01-21 DIAGNOSIS — L0390 Cellulitis, unspecified: Secondary | ICD-10-CM

## 2019-01-21 HISTORY — DX: Cellulitis, unspecified: L03.90

## 2019-01-27 ENCOUNTER — Emergency Department (HOSPITAL_COMMUNITY): Payer: 59

## 2019-01-27 ENCOUNTER — Other Ambulatory Visit: Payer: Self-pay

## 2019-01-27 ENCOUNTER — Emergency Department (HOSPITAL_COMMUNITY): Admission: EM | Admit: 2019-01-27 | Discharge: 2019-01-27 | Discharge status: Absent without leave | Payer: Self-pay

## 2019-01-27 ENCOUNTER — Inpatient Hospital Stay (HOSPITAL_COMMUNITY)
Admission: EM | Admit: 2019-01-27 | Discharge: 2019-02-14 | DRG: 264 | Disposition: A | Discharge status: Home / Self Care | Payer: 59 | Attending: Internal Medicine | Admitting: Internal Medicine

## 2019-01-27 DIAGNOSIS — Z20828 Contact with and (suspected) exposure to other viral communicable diseases: Secondary | ICD-10-CM | POA: Diagnosis present

## 2019-01-27 DIAGNOSIS — T502X5A Adverse effect of carbonic-anhydrase inhibitors, benzothiadiazides and other diuretics, initial encounter: Secondary | ICD-10-CM | POA: Diagnosis not present

## 2019-01-27 DIAGNOSIS — Z9289 Personal history of other medical treatment: Secondary | ICD-10-CM

## 2019-01-27 DIAGNOSIS — E872 Acidosis: Secondary | ICD-10-CM | POA: Diagnosis not present

## 2019-01-27 DIAGNOSIS — S0081XA Abrasion of other part of head, initial encounter: Secondary | ICD-10-CM | POA: Diagnosis present

## 2019-01-27 DIAGNOSIS — I96 Gangrene, not elsewhere classified: Secondary | ICD-10-CM | POA: Diagnosis not present

## 2019-01-27 DIAGNOSIS — E860 Dehydration: Secondary | ICD-10-CM | POA: Diagnosis present

## 2019-01-27 DIAGNOSIS — F419 Anxiety disorder, unspecified: Secondary | ICD-10-CM | POA: Diagnosis not present

## 2019-01-27 DIAGNOSIS — T368X5A Adverse effect of other systemic antibiotics, initial encounter: Secondary | ICD-10-CM | POA: Diagnosis not present

## 2019-01-27 DIAGNOSIS — Z23 Encounter for immunization: Secondary | ICD-10-CM

## 2019-01-27 DIAGNOSIS — R0602 Shortness of breath: Secondary | ICD-10-CM

## 2019-01-27 DIAGNOSIS — R4189 Other symptoms and signs involving cognitive functions and awareness: Secondary | ICD-10-CM | POA: Diagnosis present

## 2019-01-27 DIAGNOSIS — F209 Schizophrenia, unspecified: Secondary | ICD-10-CM | POA: Diagnosis present

## 2019-01-27 DIAGNOSIS — L03114 Cellulitis of left upper limb: Secondary | ICD-10-CM

## 2019-01-27 DIAGNOSIS — Z978 Presence of other specified devices: Secondary | ICD-10-CM

## 2019-01-27 DIAGNOSIS — L03119 Cellulitis of unspecified part of limb: Secondary | ICD-10-CM

## 2019-01-27 DIAGNOSIS — Z01818 Encounter for other preprocedural examination: Secondary | ICD-10-CM

## 2019-01-27 DIAGNOSIS — L03113 Cellulitis of right upper limb: Secondary | ICD-10-CM | POA: Diagnosis present

## 2019-01-27 DIAGNOSIS — Z781 Physical restraint status: Secondary | ICD-10-CM

## 2019-01-27 DIAGNOSIS — N170 Acute kidney failure with tubular necrosis: Secondary | ICD-10-CM | POA: Diagnosis not present

## 2019-01-27 DIAGNOSIS — R34 Anuria and oliguria: Secondary | ICD-10-CM | POA: Diagnosis not present

## 2019-01-27 DIAGNOSIS — I5021 Acute systolic (congestive) heart failure: Secondary | ICD-10-CM | POA: Diagnosis not present

## 2019-01-27 DIAGNOSIS — R030 Elevated blood-pressure reading, without diagnosis of hypertension: Secondary | ICD-10-CM | POA: Diagnosis not present

## 2019-01-27 DIAGNOSIS — E876 Hypokalemia: Secondary | ICD-10-CM | POA: Diagnosis not present

## 2019-01-27 DIAGNOSIS — Z0189 Encounter for other specified special examinations: Secondary | ICD-10-CM

## 2019-01-27 DIAGNOSIS — R7989 Other specified abnormal findings of blood chemistry: Secondary | ICD-10-CM

## 2019-01-27 DIAGNOSIS — I471 Supraventricular tachycardia: Secondary | ICD-10-CM | POA: Diagnosis not present

## 2019-01-27 DIAGNOSIS — J9601 Acute respiratory failure with hypoxia: Secondary | ICD-10-CM | POA: Diagnosis not present

## 2019-01-27 LAB — RAPID URINE DRUG SCREEN, HOSP PERFORMED
Amphetamines: NOT DETECTED
Barbiturates: NOT DETECTED
Benzodiazepines: NOT DETECTED
Cocaine: NOT DETECTED
Opiates: NOT DETECTED
Tetrahydrocannabinol: NOT DETECTED

## 2019-01-27 LAB — CBC WITH DIFFERENTIAL/PLATELET
Abs Immature Granulocytes: 0.05 10*3/uL (ref 0.00–0.07)
Basophils Absolute: 0.1 10*3/uL (ref 0.0–0.1)
Basophils Relative: 1 %
Eosinophils Absolute: 0 10*3/uL (ref 0.0–0.5)
Eosinophils Relative: 0 %
HCT: 45.3 % (ref 39.0–52.0)
Hemoglobin: 14.9 g/dL (ref 13.0–17.0)
Immature Granulocytes: 0 %
Lymphocytes Relative: 10 %
Lymphs Abs: 1.4 10*3/uL (ref 0.7–4.0)
MCH: 29.2 pg (ref 26.0–34.0)
MCHC: 32.9 g/dL (ref 30.0–36.0)
MCV: 88.8 fL (ref 80.0–100.0)
Monocytes Absolute: 1.6 10*3/uL — ABNORMAL HIGH (ref 0.1–1.0)
Monocytes Relative: 11 %
Neutro Abs: 11.4 10*3/uL — ABNORMAL HIGH (ref 1.7–7.7)
Neutrophils Relative %: 78 %
Platelets: 309 10*3/uL (ref 150–400)
RBC: 5.1 MIL/uL (ref 4.22–5.81)
RDW: 13.1 % (ref 11.5–15.5)
WBC: 14.5 10*3/uL — ABNORMAL HIGH (ref 4.0–10.5)
nRBC: 0 % (ref 0.0–0.2)

## 2019-01-27 LAB — ETHANOL: Alcohol, Ethyl (B): <10 mg/dL (ref <10)

## 2019-01-27 MED ORDER — ZIPRASIDONE MESYLATE 20 MG IM SOLR
10.0000 mg | Freq: Once | INTRAMUSCULAR | Status: AC
  Administered 2019-01-27: 10 mg via INTRAMUSCULAR
  Filled 2019-01-27: qty 20

## 2019-01-27 MED ORDER — TETANUS-DIPHTH-ACELL PERTUSSIS 5-2.5-18.5 LF-MCG/0.5 IM SUSP
0.5000 mL | Freq: Once | INTRAMUSCULAR | Status: AC
  Administered 2019-01-29: 0.5 mL via INTRAMUSCULAR
  Filled 2019-01-27: qty 0.5

## 2019-01-27 MED ORDER — STERILE WATER FOR INJECTION IJ SOLN
INTRAMUSCULAR | Status: AC
  Administered 2019-01-27: 10 mL
  Filled 2019-01-27: qty 10

## 2019-01-27 MED ORDER — SODIUM CHLORIDE 0.9 % IV BOLUS
1000.0000 mL | Freq: Once | INTRAVENOUS | Status: AC
  Administered 2019-01-28: 1000 mL via INTRAVENOUS
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
## 2019-01-27 NOTE — Progress Notes (Signed)
CSW attempted to assist with identifying patient since this morning. Patient is not responding verbally when asked what his name is. Patient does make a gurgling sound, shakes, and then goes back to rest. Patient has his eye closed the entire time. Per RN and nurse techs, patient did ask for orange juice and that the most communication that has taken place. RN also informed CSW that per [employer], patient was found at "[address]" in [place]. This a residential/shopping center area per the map. There are no MH facilities in that area or groups homes per CSW knowledge. CSW followed up with EDP about this. It is suspected that patient may some degree of MR. CSW will continue to follow up as needed.   Golden Circle, LCSW Transitions of Care Department [facility] ED [phone]

## 2019-01-27 NOTE — BH Assessment (Signed)
Received TTS consult request. RN states Pt is currently being given medication and is unable to participate in assessment at this time. She states Pt's mother's name is Myra [phone]. TTS will assess Pt when he is able to participate.   Evelena Peat, [facility], [facility] Triage Specialist [phone]
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
## 2019-01-27 NOTE — Progress Notes (Signed)
Received [name] asleep in his bed with the sitter at the bedside. He woke up and attempted to leave his room with a weak gait. He told this Probation officer his name is Tyrone Jenkins with a DOB October 03, 1980. He is listed as a missing person. [employer] notified his mother.  Mother stated a history of schizophrenia. He is not violent, but very restless. Mom # [phone]. Later he received Geodon 20 mg IM for agitation. He drifted off to sleep and woke up to urinate in the urinal. A saline lock was started and her received one liter of fluid per order.

## 2019-01-27 NOTE — ED Notes (Signed)
Patient has refused all shift to state his identity. Patient was offered several meals/hydration and declined each time. Patient will not respond when asked name or any questions. Patient begins to shake hands and try to get out of bed when addressed.

## 2019-01-28 LAB — COMPREHENSIVE METABOLIC PANEL
ALT: 25 U/L (ref 0–44)
AST: 34 U/L (ref 15–41)
Albumin: 5 g/dL (ref 3.5–5.0)
Alkaline Phosphatase: 57 U/L (ref 38–126)
Anion gap: 16 — ABNORMAL HIGH (ref 5–15)
BUN: 30 mg/dL — ABNORMAL HIGH (ref 6–20)
CO2: 25 mmol/L (ref 22–32)
Calcium: 9.6 mg/dL (ref 8.9–10.3)
Chloride: 100 mmol/L (ref 98–111)
Creatinine, Ser: 1.43 mg/dL — ABNORMAL HIGH (ref 0.61–1.24)
GFR calc Af Amer: 34 mL/min — ABNORMAL LOW (ref >60)
GFR calc non Af Amer: 30 mL/min — ABNORMAL LOW (ref >60)
Glucose, Bld: 111 mg/dL — ABNORMAL HIGH (ref 70–99)
Potassium: 3.3 mmol/L — ABNORMAL LOW (ref 3.5–5.1)
Sodium: 141 mmol/L (ref 135–145)
Total Bilirubin: 3.6 mg/dL — ABNORMAL HIGH (ref 0.3–1.2)
Total Protein: 7.7 g/dL (ref 6.5–8.1)

## 2019-01-28 LAB — URINALYSIS, ROUTINE W REFLEX MICROSCOPIC
Bacteria, UA: NONE SEEN
Bilirubin Urine: NEGATIVE
Glucose, UA: NEGATIVE mg/dL
Ketones, ur: 80 mg/dL — AB
Leukocytes,Ua: NEGATIVE
Nitrite: NEGATIVE
Protein, ur: 30 mg/dL — AB
Specific Gravity, Urine: 1.027 (ref 1.005–1.030)
pH: 6 (ref 5.0–8.0)

## 2019-01-28 LAB — SARS CORONAVIRUS 2 (TAT 6-24 HRS): SARS Coronavirus 2: NEGATIVE

## 2019-01-28 MED ORDER — CEPHALEXIN 500 MG PO CAPS
500.0000 mg | ORAL_CAPSULE | Freq: Three times a day (TID) | ORAL | Status: DC
  Administered 2019-01-28 – 2019-01-29 (×2): 500 mg via ORAL
  Filled 2019-01-28 (×3): qty 1

## 2019-01-28 MED ORDER — ZIPRASIDONE MESYLATE 20 MG IM SOLR
10.0000 mg | Freq: Once | INTRAMUSCULAR | Status: AC
  Administered 2019-01-28: 10 mg via INTRAMUSCULAR

## 2019-01-28 MED ORDER — LORAZEPAM 2 MG/ML IJ SOLN
1.0000 mg | Freq: Once | INTRAMUSCULAR | Status: AC
  Administered 2019-01-28: 1 mg via INTRAMUSCULAR
  Filled 2019-01-28: qty 1

## 2019-01-28 MED ORDER — ZIPRASIDONE MESYLATE 20 MG IM SOLR
20.0000 mg | Freq: Once | INTRAMUSCULAR | Status: AC
  Administered 2019-01-28: 20 mg via INTRAMUSCULAR
  Filled 2019-01-28: qty 20

## 2019-01-28 MED ORDER — STERILE WATER FOR INJECTION IJ SOLN
INTRAMUSCULAR | Status: AC
  Administered 2019-01-28: 1.2 mL
  Filled 2019-01-28: qty 10

## 2019-01-28 NOTE — ED Notes (Signed)
Pt pacing around room with steady gait. This Probation officer made multiple attempts to speak to pt and offer him things he might need like food or drink. Pt continues pacing around room and does not answer when addressed.

## 2019-01-28 NOTE — ED Notes (Signed)
Pt given urinal to provide urine specimen

## 2019-01-28 NOTE — ED Notes (Signed)
Pt asked for cup of water and orange juice. Both given to pt with pt sitting up in bed.

## 2019-01-28 NOTE — ED Notes (Signed)
Pt continued pacing around his room and then proceeded to attempt to find an exit. Security called for assistance as pt would not follow commands and still would not communicate with staff. Pt placed back in bed and posey belt applied. Pt continues to refuse communication with staff.

## 2019-01-28 NOTE — ED Notes (Signed)
Patient awake but refuses to eat or drink. Does not communicate.

## 2019-01-28 NOTE — ED Notes (Signed)
Pt awake, staring, not communicating, lying in bed.
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
## 2019-01-28 NOTE — BH Assessment (Signed)
Tele Assessment Note   Patient Name: Tyrone Jenkins MRN: [id number] Referring Physician: Alvira Monday, MD Location of Patient: [name] ED, [phone] Location of Provider: Behavioral Health TTS Department  Naseer Hearn is an 38 y.o. single male who presents unaccompanied to [name] ED via law enforcement after being found walking around [employer]. Pt has a large open wound to left lateral distal wrist.forearm. He has been non-communicative and initially Pt's identity was unknown. Pt was listed as a missing person and it was determined he is [name]. Law enforcement notified his mother, [name] [phone]. During assessment Pt was entirely non-communicative and had been pacing in the room. He looked briefly at the tele-cart but otherwise didn't interact.   Per medical record, Pt has a history of schizophrenia. TTS attempted to contact Pt's mother for collateral information but did not receive a response. Care Everywhere has no records. EDP spoke with Pt's mother earlier today and she reported that for the past 10-14 days Pt has not been sleeping and has been stressed at work. He developed trouble speaking, an indication he needs to see a physician. Tuesday he seemed like he couldn't speak, then thought maybe he needed to rest then Thursday thought need to take him, was looking into where to take him. Saw him Thursday, said he didn't need mom to stay. Has had episodes like this where he doesn't speak due to his schizophrenia.  First episode was 2012.  Has had several episodes, had to take him to [place], seemed improved after that, had been to [facility] in [place] and [facility].  Didn't state SI or HI, but reports he has episodes of stress, where he will decompensate, not sleep, will not talk.  Doesn't think he has tried to hurt himself. Was going to see him last night and couldn't find him. Lives independently in a condo. Pt's blood alcohol and  urine drug screen is negative.  Pt is dressed in hospital scrubs and alert. Pt will not communicate verbally or with gestures. Motor behavior appears restless with Pt pacing in his room. Eye contact is minimal. Pt's mood appears preoccupied and affect is flat.    Diagnosis: F20.9 Schizophrenia  Past Medical History: No past medical history on file.    Family History: No family history on file.  Social History:  has no history on file for tobacco, alcohol, and drug.  Additional Social History:  Alcohol / Drug Use Pain Medications: Unknown Prescriptions: Unknown Over the Counter: Unknown History of alcohol / drug use?: (Unable to assess) Longest period of sobriety (when/how long): NA  CIWA: CIWA-Ar BP: 137/77 Pulse Rate: 84 COWS:    Allergies: Not on File  Home Medications: (Not in a hospital admission)   OB/GYN Status:  No LMP for male patient.  General Assessment Data Assessment unable to be completed: Yes Reason for not completing assessment: Pt alseep Location of Assessment: [facility] TTS Assessment: In system Is this a Tele or Face-to-Face Assessment?: Tele Assessment Is this an Initial Assessment or a Re-assessment for this encounter?: Initial Assessment Patient Accompanied by:: N/A Language Other than English: No Living Arrangements: Other (Comment)(lives alone in condo) What gender do you identify as?: Male Marital status: Single Maiden name: NA Pregnancy Status: No Living Arrangements: Alone Can pt return to current living arrangement?: Yes Admission Status: Voluntary Is patient capable of signing voluntary admission?: No Referral Source: Other Insurance type: Unknown     Crisis Care Plan Living Arrangements: Alone Legal  Guardian: Other:(Self) Name of Psychiatrist: Unknown Name of Therapist: Unknown  Education Status Is patient currently in school?: No Is the patient employed, unemployed or receiving disability?: (Unknown)  Risk to self with the  past 6 months Suicidal Ideation: (Unable to assess due to Pt not communicating) Has patient been a risk to self within the past 6 months prior to admission? : (Unable to assess due to Pt not communicating) Suicidal Intent: (Unable to assess due to Pt not communicating) Has patient had any suicidal intent within the past 6 months prior to admission? : (Unable to assess due to Pt not communicating) Is patient at risk for suicide?: (Unable to assess due to Pt not communicating) Suicidal Plan?: (Unable to assess due to Pt not communicating) Has patient had any suicidal plan within the past 6 months prior to admission? : (Unable to assess due to Pt not communicating) Access to Means: (Unable to assess due to Pt not communicating) What has been your use of drugs/alcohol within the last 12 months?: Unable to assess due to Pt not communicating Previous Attempts/Gestures: (Unable to assess due to Pt not communicating) How many times?: (Unable to assess due to Pt not communicating) Other Self Harm Risks: Pt cut his left wrist Triggers for Past Attempts: Unknown Intentional Self Injurious Behavior: Cutting Comment - Self Injurious Behavior: Pt has cut left wrist Family Suicide History: Unknown Recent stressful life event(s): Other (Comment)(Unable to assess due to Pt not communicating) Persecutory voices/beliefs?: (Unable to assess due to Pt not communicating) Depression: (Unable to assess due to Pt not communicating) Depression Symptoms: Isolating Substance abuse history and/or treatment for substance abuse?: (Unable to assess due to Pt not communicating) Suicide prevention information given to non-admitted patients: Not applicable  Risk to Others within the past 6 months Homicidal Ideation: (Unable to assess due to Pt not communicating) Does patient have any lifetime risk of violence toward others beyond the six months prior to admission? : (Unable to assess due to Pt not communicating) Thoughts of  Harm to Others: (Unable to assess due to Pt not communicating) Current Homicidal Intent: (Unable to assess due to Pt not communicating) Current Homicidal Plan: (Unable to assess due to Pt not communicating) Access to Homicidal Means: (Unable to assess due to Pt not communicating) Identified Victim: Unable to assess due to Pt not communicating History of harm to others?: (Unable to assess due to Pt not communicating) Assessment of Violence: (Unable to assess due to Pt not communicating) Violent Behavior Description: Unable to assess due to Pt not communicating Does patient have access to weapons?: (Unable to assess due to Pt not communicating) Criminal Charges Pending?: No Does patient have a court date: No Is patient on probation?: No  Psychosis Hallucinations: (Unable to assess due to Pt not communicating) Delusions: Unspecified  Mental Status Report Appearance/Hygiene: In scrubs Eye Contact: Poor Motor Activity: Other (Comment)(Pt pacing in room) Speech: Other (Comment)(Pt isn't speaking) Level of Consciousness: Alert Mood: Preoccupied Affect: Flat Anxiety Level: (Unable to assess due to Pt not communicating) Thought Processes: Unable to Assess Judgement: Impaired Orientation: Unable to assess Obsessive Compulsive Thoughts/Behaviors: Unable to Assess  Cognitive Functioning Concentration: Unable to Assess Memory: Unable to Assess Is patient IDD: No Insight: Unable to Assess Impulse Control: Unable to Assess Appetite: Poor Have you had any weight changes? : (Unable to assess due to Pt not communicating) Sleep: Unable to Assess Total Hours of Sleep: (Unable to assess due to Pt not communicating) Vegetative Symptoms: Unable to Assess  ADLScreening [facility]  Assessment Services) Patient's cognitive ability adequate to safely complete daily activities?: No Patient able to express need for assistance with ADLs?: No Independently performs ADLs?: No     Prior Outpatient  Therapy Prior Outpatient Therapy: Yes Prior Therapy Dates: Unable to assess due to Pt not communicating Prior Therapy Facilty/Provider(s): Unable to assess due to Pt not communicating Reason for Treatment: Schizophrenia Does patient have an ACCT team?: No Does patient have Intensive In-House Services?  : No Does patient have [facility] services? : No Does patient have P4CC services?: No  ADL Screening (condition at time of admission) Patient's cognitive ability adequate to safely complete daily activities?: No Is the patient deaf or have difficulty hearing?: No Does the patient have difficulty seeing, even when wearing glasses/contacts?: No Does the patient have difficulty concentrating, remembering, or making decisions?: Yes Patient able to express need for assistance with ADLs?: No Does the patient have difficulty dressing or bathing?: Yes(Due to mental status) Independently performs ADLs?: No Communication: (Pt currently not communicating) Does the patient have difficulty walking or climbing stairs?: No Weakness of Legs: None Weakness of Arms/Hands: None       Abuse/Neglect Assessment (Assessment to be complete while patient is alone) Abuse/Neglect Assessment Can Be Completed: Unable to assess, patient is non-responsive or altered mental status     Advance Directives (For Healthcare) Does Patient Have a Medical Advance Directive?: Unable to assess, patient is non-responsive or altered mental status          Disposition: Binnie RailJoAnn Glover, [facility], confirmed adult unit is currently at capacity. Gave clinical report to Nira ConnJason Berry, NP who said Pt meets criteria for inpatient psychiatric treatment. TTS will contact other facilities for placement. Notified Dr. Jacalyn LefevreJulie Haviland and [facility] staff of recommendation.  Disposition Initial Assessment Completed for this Encounter: Yes  This service was provided via telemedicine using a 2-way, interactive audio and video  technology.  Names of all persons participating in this telemedicine service and their role in this encounter. Name: Murlean Callerimothy Mehlman Role: Patient  Name: Shela CommonsFord Donita Newland Jr, [facility] Role: TTS counselor         Harlin RainFord Ellis Patsy BaltimoreWarrick Jr, [facility], [facility] Triage Specialist [phone]  Pamalee LeydenWarrick Jr, Barry Culverhouse Ellis 01/28/2019 8:33 PM

## 2019-01-28 NOTE — ED Notes (Signed)
Patient had a sandwich and 600 ml.

## 2019-01-29 ENCOUNTER — Other Ambulatory Visit: Payer: Self-pay | Admitting: Psychiatry

## 2019-01-29 ENCOUNTER — Encounter (HOSPITAL_COMMUNITY): Payer: Self-pay | Admitting: Registered Nurse

## 2019-01-29 ENCOUNTER — Emergency Department (HOSPITAL_COMMUNITY): Payer: 59

## 2019-01-29 DIAGNOSIS — F201 Disorganized schizophrenia: Secondary | ICD-10-CM

## 2019-01-29 LAB — COMPREHENSIVE METABOLIC PANEL
ALT: 23 U/L (ref 0–44)
AST: 29 U/L (ref 15–41)
Albumin: 4.3 g/dL (ref 3.5–5.0)
Alkaline Phosphatase: 56 U/L (ref 38–126)
Anion gap: 9 (ref 5–15)
BUN: 23 mg/dL — ABNORMAL HIGH (ref 6–20)
CO2: 27 mmol/L (ref 22–32)
Calcium: 8.8 mg/dL — ABNORMAL LOW (ref 8.9–10.3)
Chloride: 105 mmol/L (ref 98–111)
Creatinine, Ser: 1.11 mg/dL (ref 0.61–1.24)
GFR calc Af Amer: >60 mL/min (ref >60)
GFR calc non Af Amer: >60 mL/min (ref >60)
Glucose, Bld: 102 mg/dL — ABNORMAL HIGH (ref 70–99)
Potassium: 3.1 mmol/L — ABNORMAL LOW (ref 3.5–5.1)
Sodium: 141 mmol/L (ref 135–145)
Total Bilirubin: 2.2 mg/dL — ABNORMAL HIGH (ref 0.3–1.2)
Total Protein: 7 g/dL (ref 6.5–8.1)

## 2019-01-29 LAB — CBC WITH DIFFERENTIAL/PLATELET
Abs Immature Granulocytes: 0.01 10*3/uL (ref 0.00–0.07)
Basophils Absolute: 0.1 10*3/uL (ref 0.0–0.1)
Basophils Relative: 1 %
Eosinophils Absolute: 0.1 10*3/uL (ref 0.0–0.5)
Eosinophils Relative: 1 %
HCT: 42.9 % (ref 39.0–52.0)
Hemoglobin: 14.3 g/dL (ref 13.0–17.0)
Immature Granulocytes: 0 %
Lymphocytes Relative: 25 %
Lymphs Abs: 2.6 10*3/uL (ref 0.7–4.0)
MCH: 30 pg (ref 26.0–34.0)
MCHC: 33.3 g/dL (ref 30.0–36.0)
MCV: 89.9 fL (ref 80.0–100.0)
Monocytes Absolute: 1.3 10*3/uL — ABNORMAL HIGH (ref 0.1–1.0)
Monocytes Relative: 12 %
Neutro Abs: 6.3 10*3/uL (ref 1.7–7.7)
Neutrophils Relative %: 61 %
Platelets: 273 10*3/uL (ref 150–400)
RBC: 4.77 MIL/uL (ref 4.22–5.81)
RDW: 13.3 % (ref 11.5–15.5)
WBC: 10.4 10*3/uL (ref 4.0–10.5)
nRBC: 0 % (ref 0.0–0.2)

## 2019-01-29 MED ORDER — VANCOMYCIN HCL 10 G IV SOLR
2000.0000 mg | Freq: Once | INTRAVENOUS | Status: DC
  Administered 2019-01-29: 2000 mg via INTRAVENOUS
  Filled 2019-01-29: qty 2000

## 2019-01-29 MED ORDER — PIPERACILLIN-TAZOBACTAM 3.375 G IVPB 30 MIN
3.3750 g | Freq: Once | INTRAVENOUS | Status: AC
  Administered 2019-01-29: 3.375 g via INTRAVENOUS
  Filled 2019-01-29 (×2): qty 50

## 2019-01-29 MED ORDER — LORAZEPAM 1 MG PO TABS
2.0000 mg | ORAL_TABLET | Freq: Every day | ORAL | Status: DC
  Administered 2019-01-29 – 2019-02-13 (×16): 2 mg via ORAL
  Filled 2019-01-29 (×16): qty 2

## 2019-01-29 MED ORDER — HALOPERIDOL LACTATE 5 MG/ML IJ SOLN
5.0000 mg | Freq: Every day | INTRAMUSCULAR | Status: DC
  Filled 2019-01-29 (×2): qty 1

## 2019-01-29 MED ORDER — BENZTROPINE MESYLATE 1 MG PO TABS
1.0000 mg | ORAL_TABLET | Freq: Every day | ORAL | Status: DC
  Administered 2019-01-29 – 2019-02-13 (×15): 1 mg via ORAL
  Filled 2019-01-29 (×17): qty 1

## 2019-01-29 MED ORDER — CEFTRIAXONE SODIUM 250 MG IJ SOLR
250.0000 mg | INTRAMUSCULAR | Status: DC
  Administered 2019-01-29: 250 mg via INTRAMUSCULAR
  Filled 2019-01-29: qty 250

## 2019-01-29 MED ORDER — BENZTROPINE MESYLATE 1 MG/ML IJ SOLN
1.0000 mg | Freq: Every day | INTRAMUSCULAR | Status: DC
  Filled 2019-01-29: qty 2
  Filled 2019-01-29 (×12): qty 1

## 2019-01-29 MED ORDER — CLINDAMYCIN PHOSPHATE 900 MG/50ML IV SOLN
900.0000 mg | Freq: Once | INTRAVENOUS | Status: DC
  Administered 2019-01-29: 900 mg via INTRAVENOUS
  Filled 2019-01-29 (×2): qty 50

## 2019-01-29 MED ORDER — HALOPERIDOL 5 MG PO TABS
5.0000 mg | ORAL_TABLET | Freq: Every day | ORAL | Status: DC
  Administered 2019-01-29 – 2019-02-13 (×15): 5 mg via ORAL
  Filled 2019-01-29 (×18): qty 1

## 2019-01-29 MED ORDER — STERILE WATER FOR INJECTION IJ SOLN
INTRAMUSCULAR | Status: AC
  Administered 2019-01-29: 0.9 mL
  Filled 2019-01-29: qty 10

## 2019-01-29 MED ORDER — LORAZEPAM 2 MG/ML IJ SOLN
2.0000 mg | Freq: Every day | INTRAMUSCULAR | Status: DC
  Filled 2019-01-29 (×2): qty 1

## 2019-01-29 NOTE — Progress Notes (Signed)
01/29/2019  1630  Ava [phone] at [facility] called stating patient has a room and he needs to transport after 8pm today. Dr Weber Cooks is accepting patient. Call report to [phone].

## 2019-01-29 NOTE — H&P (Signed)
History and Physical    Tyrone Jenkins ONG:[id number] DOB: [birth year] DOA: 01/27/2019  PCP: Patient, No Pcp Per Consultants:  None NOK:  Mother, Tyrone Jenkins, [phone]  Chief Complaint: Wrist wound   HPI: Tyrone Jenkins is a 38 y.o. male with medical history significant of schizophrenia presenting with acute on chronic wrist wounds.  Patient was found walking around on the road on 11/7 and was noted to have abrasions on his R wrist and under chin.  He was also found to have a large open wound to his left wrist with uneven edges, partial necrosis, and mild surrounding erythema.  Wet to dry dressings were applied.  With further evaluation, the patient was noted to have h/o schizophrenia and has spells where he doesn't talk.  Over the next few days, the thought was the surrounding erythema from his wounds may have been related to wrist restraints.  IVC paperwork was completed and [facility].  However, prior to the transfer this evening, the nurse was concerned about his wounds and CT appeared to confirm cellulitis.  Hand surgery was consulted and may need to be consulted tomorrow AM for further evaluation.    ED Course:  Schizophrenia.  Seen yesterday for a wound then but now markedly worse.  Necrotic, surrounding erythema, leukocytosis actually improved.  Likely needs at least obs.  Dr. Janee Mornhompson wasn't overly concerned.  Review of Systems: Unable to perform   Ambulatory Status:  Ambulates without assistance  Past Medical History:  Diagnosis Date  . Schizophrenia (HCC)     History reviewed. No pertinent surgical history.  Social History   Socioeconomic History  . Marital status: Single    Spouse name: Not on file  . Number of children: Not on file  . Years of education: Not on file  . Highest education level: Not on file  Occupational History  . Not on file  Social Needs  . Financial resource strain: Not on file  . Food insecurity    Worry: Not on  file    Inability: Not on file  . Transportation needs    Medical: Not on file    Non-medical: Not on file  Tobacco Use  . Smoking status: Not on file  Substance and Sexual Activity  . Alcohol use: Not on file  . Drug use: Not on file  . Sexual activity: Not on file  Lifestyle  . Physical activity    Days per week: Not on file    Minutes per session: Not on file  . Stress: Not on file  Relationships  . Social Musicianconnections    Talks on phone: Not on file    Gets together: Not on file    Attends religious service: Not on file    Active member of club or organization: Not on file    Attends meetings of clubs or organizations: Not on file    Relationship status: Not on file  . Intimate partner violence    Fear of current or ex partner: Not on file    Emotionally abused: Not on file    Physically abused: Not on file    Forced sexual activity: Not on file  Other Topics Concern  . Not on file  Social History Narrative  . Not on file    No Known Allergies  History reviewed. No pertinent family history.  Prior to Admission medications   Not on File    Physical Exam: Vitals:   01/29/19 0647 01/29/19  1200 01/29/19 1751 01/29/19 2044  BP: 127/90 105/74 122/80 114/81  Pulse: 92 72 73 74  Resp: 16 16 18 15   Temp: 98.6 F (37 C) (!) 97.3 F (36.3 C) 99.2 F (37.3 C) 99.9 F (37.7 C)  TempSrc: Oral Oral Axillary Oral  SpO2: 95% 92% 95% 93%  Weight:      Height:         . General:  Appears calm and comfortable, non-verbal . Eyes:  Closed, normal lids . ENT:  normal lips . Neck:  no LAD, masses or thyromegaly . Cardiovascular:  RRR, no m/r/g. No LE edema.  Marland Kitchen Respiratory:   CTA bilaterally with no wheezes/rales/rhonchi.  Normal respiratory effort. . Abdomen:  soft, NT, ND, NABS . Skin:  B wrists with purulent wounds, L > R with necrosis of the left wrist, streaking erythema up to the elbows bilaterally          . Musculoskeletal:  grossly normal tone BUE/BLE,  no bony abnormality . Psychiatric:  Clearly abnormal mood and affect, nonverbal, appears to willfullly not participate in care . Neurologic:  Unable to perform    Radiological Exams on Admission: Ct Forearm Left Wo Contrast  Result Date: 01/29/2019 CLINICAL DATA:  Gangrenous wound of the left forearm EXAM: CT OF THE LEFT FOREARM WITHOUT CONTRAST TECHNIQUE: Multidetector CT imaging was performed according to the standard protocol. Multiplanar CT image reconstructions were also generated. COMPARISON:  X-ray 01/27/2019 FINDINGS: Bones/Joint/Cartilage No acute fracture or dislocation. No cortical erosion or periostitis. No focal bone lesion. Joint spaces of the elbow and wrist are maintained. No elbow joint effusion. Ligaments Suboptimally assessed by CT. Muscles and Tendons Muscular bulk is maintained without atrophy or fatty infiltration. Tendinous structures intact within the limitations of this exam. No large tenosynovial fluid collection is evident. Soft tissues There is skin defect overlying the radial aspect of the distal forearm at the level of the distal radial metaphysis with underlying soft tissue induration (series 3, image 47). No underlying fluid collection or soft tissue gas. There is mild subcutaneous edema within the surrounding tissues. No deep fascial fluid is evident by CT. No radiopaque soft tissue foreign bodies. IMPRESSION: 1. Skin defect overlying the radial aspect of the distal forearm at the level of the distal radial metaphysis with underlying soft tissue induration compatible with known ulceration and associated cellulitis. No underlying fluid collection/abscess or soft tissue gas. 2. No acute osseous abnormality or CT evidence of osteomyelitis. Electronically Signed   By: Davina Poke M.D.   On: 01/29/2019 21:44    EKG: not done  Labs on Admission: I have personally reviewed the available labs and imaging studies at the time of the admission.  Pertinent labs:   K+ 3.1  BUN 23/Creatinine 1.11/GFR >60 Bili 2.2 Normal CBC UA: moderage Hgb, 80 ketones, 30 protein   Assessment/Plan Principal Problem:   Cellulitis of arm Active Problems:   Schizophrenia (HCC)   Cellulitis of B arms -Patient with possible self injury to B palmar-side wrists -Possibly exacerbated by wrist restraints in the ER -Currently with erythema that is streaking to the B elbows -There is necrosis of the edges of the left wrist wound and this appears to be in need of debridement; will defer to day team tomorrow, but would consider reconsultation of hand surgery -Normal WBC count -She was given Clinda and Zosyn in the ER -Will not give Clindamycin due to rapidly developing resistance for this antibiotic nationally -Will instead start IV Vancomycin -Will observe for  now, pending clinical response and possible need for hand surgery consult  Schizophrenia -Patient with apparent mental health crisis in need of inpatient BH treatment -IVC paperwork is completed -For transfer to [facility] once medically stable -For now, observe with 1:1 safety sitter -Does not appear to have been taking medications at home -Started on Haldol, Ativan, and Cogentin; will continue these medications for now    Note: This patient has been tested and is negative for the novel coronavirus COVID-19.  DVT prophylaxis:  Lovenox  Code Status:  Full  Family Communication: None present  Disposition Plan:  Transfer to [facility] once clinically improved Consults called: BH; hand surgery Admission status: It is my clinical opinion that referral for OBSERVATION is reasonable and necessary in this patient based on the above information provided. The aforementioned taken together are felt to place the patient at high risk for further clinical deterioration. However it is anticipated that the patient may be medically stable for discharge from the hospital within 24 to 48 hours.     Jonah Blue MD [facility]   How  to contact the [facility] Attending or Consulting provider 7A - 7P or covering provider during after hours 7P -7A, for this patient?  1. Check the care team in [facility] and look for a) attending/consulting TRH provider listed and b) the [facility] team listed 2. Log into [email or web address] and use [facility]'s universal password to access. If you do not have the password, please contact the hospital operator. 3. Locate the [facility] provider you are looking for under [facility] and page to a number that you can be directly reached. 4. If you still have difficulty reaching the provider, please page the [facility] (Director on Call) for the Hospitalists listed on amion for assistance.   01/30/2019, 12:44 AM

## 2019-01-29 NOTE — Consult Note (Signed)
   HPI:  Per TTS Assessment Note reviewed by this provider:  [name] is an 38 y.o. single male who presents unaccompanied to [facility] via Event organiser after being found walking around [employer]. Pt has a large open wound to left lateral distal wrist.forearm. He has been non-communicative and initially Pt's identity was unknown. Pt was listed as a missing person and it was determined he is Tyrone Jenkins. Law enforcement notified his mother, Exzavier Ruderman [phone]. During assessment Pt was entirely non-communicative and had been pacing in the room. He looked briefly at the tele-cart but otherwise didn't interact.  Per medical record, Pt has a history of schizophrenia. TTS attempted to contact Pt's mother for collateral information but did not receive a response. Care Everywhere has no records. EDP spoke with Pt's mother earlier today and she reported that for the past 10-14 days Pt has not been sleeping and has been stressed at work. He developed trouble speaking, an indication he needs to see a physician. Tuesday he seemed like he couldn't speak, then thought maybe he needed to rest then Thursday thought need to take him, was looking into where to take him. Saw him Thursday, said he didn't need mom to stay. Has had episodes like this where he doesn't speak due to his schizophrenia. First episode was 2012. Has had several episodes, had to take him to [place], seemed improved after that, had been to [facility] in [place] and [facility]. Didn't state SI or HI, but reports he has episodes of stress, where he will decompensate, not sleep, will not talk. Doesn't think he has tried to hurt himself. Was going to see him last night and couldn't find him. Lives independently in a condo. Pt's blood alcohol and urine drug screen is negative. Pt is dressed in hospital scrubs and alert. Pt will not communicate verbally or with gestures. Motor behavior appears restless  with Pt pacing in his room. Eye contact is minimal. Pt's mood appears preoccupied and affect is flat.    Tyrone Jenkins, 38 y.o., male patient seen via tele psych by this provider, Dr. Dwyane Dee; and chart reviewed on 01/29/19.  On evaluation Devario Bucklew still not speaking.  Patient was asked his name and current location but did not reply.  Patient was looking to work computer screen but did not answer.  Spoke with patient's nurse who reported that patient has not been talking at all and states a minimal breakfast. According to patient's record he has a history of schizophrenia and has had several similar episodes.    Medication management:  Ordered Haldol 5 mg/Ativan 2 mg/Cogentin 1 mg to be given IM or p.o. once daily  Disposition: Recommend psychiatric Inpatient admission when medically cleared.   Inpatient psychiatric treatment; will continue to see bed.     Shuvon B. Rankin, NP

## 2019-01-29 NOTE — Progress Notes (Signed)
A consult was received from an ED physician for vanc/clinda/zosyn per pharmacy dosing.  The patient's profile has been reviewed for ht/wt/allergies/indication/available labs.   A one time order has been placed for vanc 2g, clinda 900mg , and Zosyn 3.375g.  Further antibiotics/pharmacy consults should be ordered by admitting physician if indicated.                       Thank you, Kara Mead 01/29/2019  8:58 PM

## 2019-01-29 NOTE — ED Notes (Addendum)
Took dressing off left wrist of patient before being transported to [facility]. Wound looked terrible with large open area with 50% of wound being completely black. Asked Dr. Sedonia Small to come and see wound. He ordered a CT Left Forearm without contrast.  Awaiting CT and results. Dr. Sedonia Small also ordered hand surgical consult and 3 different IV antibiotics. He stated that the patient would not be transported to [place] at this time. Notified [facility] of this delay.

## 2019-01-29 NOTE — Progress Notes (Signed)
01/29/2019  1500 Labs drawn. Dark green, light green, gold, and purple tubes sent to main lab.

## 2019-01-29 NOTE — BH Assessment (Signed)
Pt accepted to [facility]  Call report to [phone].

## 2019-01-29 NOTE — Care Management (Signed)
Patient accepted to [facility], per [facility] Accepting Dr. Weber Cooks The number to give report is [phone]  The date and time that the patient can be transported on 01-29-2019 after 8pm   Writer informed the nurse working with the patient.

## 2019-01-29 NOTE — BH Assessment (Signed)
Patient has been accepted to [facility].  Accepting physician is Dr. Claris Gower.  Attending Physician will be Dr. Weber Cooks.  Patient has been assigned to room 316, by [place].  Call report to [phone].  Representative/Transfer Coordinator is Print production planner [facility] TTS) Patient pre-admitted by [facility] Patient Access Elberta Fortis)   [facility] Staff (Ava Johnnye Sima, TTS) made aware of acceptance.

## 2019-01-29 NOTE — ED Provider Notes (Signed)
  Provider Note MRN:  [id number]  Arrival date & time: 01/29/19    ED Course and Medical Decision Making  Assumed care from default provider at shift change.  Patient's wounds were rechecked prior to departure to psych facility.  I was called to bedside for concern for blackened discoloration of the left wrist wound.  See picture below.  Upon chart review, I suspect that this is a significant change to his initial exam.  Patient remains hemodynamically stable without fever.  Will consult hand surgery and obtain CT imaging to evaluate for necrotizing infection.  11 PM update: I consulted Dr. Grandville Silos hand surgery, who was able to review the pictures and agreed with CT imaging. CT is without evidence of deeper space infection, there is evidence of cellulitis.  Given patient's original leukocytosis, his current oral temperature of 99.9, there is concern for systemic features of the cellulitis, there is also concern for a need for source control given the necrotic appearance of this wound.  Accepted for admission to the hospitalist service for antibiotics and further wound management, would consider reconsulting hand surgery in the morning if there is continued concern for source control.   Media Information    Document Information  Photos    01/29/2019 20:32  Attached To:  Hospital Encounter on 01/27/19  Source Information  Marnee Sherrard, Barth Kirks, MD  [facility]     Procedures  Final Clinical Impressions(s) / ED Diagnoses     ICD-10-CM   1. Cellulitis of left upper extremity  L03.114   2. Necrosis [facility])  Marvin.Severe     ED Discharge Orders    None      Discharge Instructions   None     Barth Kirks. Sedonia Small, [place] [email or web address]    Maudie Flakes, MD 01/29/19 [phone]

## 2019-01-29 NOTE — ED Notes (Signed)
Pt ate 50% of his dinner

## 2019-01-30 ENCOUNTER — Encounter (HOSPITAL_COMMUNITY): Payer: Self-pay | Admitting: Internal Medicine

## 2019-01-30 DIAGNOSIS — L03113 Cellulitis of right upper limb: Secondary | ICD-10-CM | POA: Diagnosis present

## 2019-01-30 DIAGNOSIS — E872 Acidosis: Secondary | ICD-10-CM | POA: Diagnosis not present

## 2019-01-30 DIAGNOSIS — F209 Schizophrenia, unspecified: Secondary | ICD-10-CM | POA: Diagnosis present

## 2019-01-30 DIAGNOSIS — T368X5A Adverse effect of other systemic antibiotics, initial encounter: Secondary | ICD-10-CM | POA: Diagnosis not present

## 2019-01-30 DIAGNOSIS — N170 Acute kidney failure with tubular necrosis: Secondary | ICD-10-CM | POA: Diagnosis not present

## 2019-01-30 DIAGNOSIS — I96 Gangrene, not elsewhere classified: Secondary | ICD-10-CM | POA: Diagnosis present

## 2019-01-30 DIAGNOSIS — L03119 Cellulitis of unspecified part of limb: Secondary | ICD-10-CM | POA: Diagnosis not present

## 2019-01-30 DIAGNOSIS — R34 Anuria and oliguria: Secondary | ICD-10-CM | POA: Diagnosis not present

## 2019-01-30 DIAGNOSIS — R4189 Other symptoms and signs involving cognitive functions and awareness: Secondary | ICD-10-CM | POA: Diagnosis present

## 2019-01-30 DIAGNOSIS — E860 Dehydration: Secondary | ICD-10-CM | POA: Diagnosis present

## 2019-01-30 DIAGNOSIS — I5021 Acute systolic (congestive) heart failure: Secondary | ICD-10-CM | POA: Diagnosis not present

## 2019-01-30 DIAGNOSIS — I371 Nonrheumatic pulmonary valve insufficiency: Secondary | ICD-10-CM | POA: Diagnosis not present

## 2019-01-30 DIAGNOSIS — R7989 Other specified abnormal findings of blood chemistry: Secondary | ICD-10-CM | POA: Diagnosis not present

## 2019-01-30 DIAGNOSIS — Z781 Physical restraint status: Secondary | ICD-10-CM | POA: Diagnosis not present

## 2019-01-30 DIAGNOSIS — Z20828 Contact with and (suspected) exposure to other viral communicable diseases: Secondary | ICD-10-CM | POA: Diagnosis present

## 2019-01-30 DIAGNOSIS — R030 Elevated blood-pressure reading, without diagnosis of hypertension: Secondary | ICD-10-CM | POA: Diagnosis not present

## 2019-01-30 DIAGNOSIS — T502X5A Adverse effect of carbonic-anhydrase inhibitors, benzothiadiazides and other diuretics, initial encounter: Secondary | ICD-10-CM | POA: Diagnosis not present

## 2019-01-30 DIAGNOSIS — N179 Acute kidney failure, unspecified: Secondary | ICD-10-CM | POA: Diagnosis not present

## 2019-01-30 DIAGNOSIS — F419 Anxiety disorder, unspecified: Secondary | ICD-10-CM | POA: Diagnosis not present

## 2019-01-30 DIAGNOSIS — S0081XA Abrasion of other part of head, initial encounter: Secondary | ICD-10-CM | POA: Diagnosis present

## 2019-01-30 DIAGNOSIS — J9601 Acute respiratory failure with hypoxia: Secondary | ICD-10-CM | POA: Diagnosis not present

## 2019-01-30 DIAGNOSIS — F203 Undifferentiated schizophrenia: Secondary | ICD-10-CM | POA: Diagnosis not present

## 2019-01-30 DIAGNOSIS — L03114 Cellulitis of left upper limb: Secondary | ICD-10-CM | POA: Diagnosis present

## 2019-01-30 DIAGNOSIS — I471 Supraventricular tachycardia: Secondary | ICD-10-CM | POA: Diagnosis not present

## 2019-01-30 DIAGNOSIS — Z23 Encounter for immunization: Secondary | ICD-10-CM | POA: Diagnosis not present

## 2019-01-30 DIAGNOSIS — E876 Hypokalemia: Secondary | ICD-10-CM | POA: Diagnosis not present

## 2019-01-30 LAB — BASIC METABOLIC PANEL
Anion gap: 14 (ref 5–15)
BUN: 25 mg/dL — ABNORMAL HIGH (ref 6–20)
CO2: 23 mmol/L (ref 22–32)
Calcium: 8.6 mg/dL — ABNORMAL LOW (ref 8.9–10.3)
Chloride: 104 mmol/L (ref 98–111)
Creatinine, Ser: 1.53 mg/dL — ABNORMAL HIGH (ref 0.61–1.24)
GFR calc Af Amer: >60 mL/min (ref >60)
GFR calc non Af Amer: 56 mL/min — ABNORMAL LOW (ref >60)
Glucose, Bld: 97 mg/dL (ref 70–99)
Potassium: 3.2 mmol/L — ABNORMAL LOW (ref 3.5–5.1)
Sodium: 141 mmol/L (ref 135–145)

## 2019-01-30 LAB — CBC
HCT: 41.2 % (ref 39.0–52.0)
Hemoglobin: 13.6 g/dL (ref 13.0–17.0)
MCH: 30 pg (ref 26.0–34.0)
MCHC: 33 g/dL (ref 30.0–36.0)
MCV: 90.7 fL (ref 80.0–100.0)
Platelets: 296 10*3/uL (ref 150–400)
RBC: 4.54 MIL/uL (ref 4.22–5.81)
RDW: 13.2 % (ref 11.5–15.5)
WBC: 10 10*3/uL (ref 4.0–10.5)
nRBC: 0 % (ref 0.0–0.2)

## 2019-01-30 LAB — HIV ANTIBODY (ROUTINE TESTING W REFLEX): HIV Screen 4th Generation wRfx: NONREACTIVE

## 2019-01-30 MED ORDER — VANCOMYCIN HCL 10 G IV SOLR: 1250.0000 mg | Freq: Two times a day (BID) | INTRAVENOUS | Status: DC

## 2019-01-30 MED ORDER — DOCUSATE SODIUM 100 MG PO CAPS
100.0000 mg | ORAL_CAPSULE | Freq: Two times a day (BID) | ORAL | Status: DC
  Administered 2019-01-30 – 2019-02-09 (×17): 100 mg via ORAL
  Filled 2019-01-30 (×23): qty 1

## 2019-01-30 MED ORDER — SODIUM CHLORIDE 0.9 % IV SOLN
INTRAVENOUS | Status: DC
  Administered 2019-01-30 – 2019-02-04 (×5): via INTRAVENOUS

## 2019-01-30 MED ORDER — ONDANSETRON HCL 4 MG/2ML IJ SOLN
4.0000 mg | Freq: Four times a day (QID) | INTRAMUSCULAR | Status: DC | PRN
  Administered 2019-02-04 – 2019-02-08 (×4): 4 mg via INTRAVENOUS
  Filled 2019-01-30 (×5): qty 2

## 2019-01-30 MED ORDER — ENOXAPARIN SODIUM 40 MG/0.4ML ~~LOC~~ SOLN
40.0000 mg | SUBCUTANEOUS | Status: DC
  Administered 2019-01-31: 40 mg via SUBCUTANEOUS
  Filled 2019-01-30 (×2): qty 0.4

## 2019-01-30 MED ORDER — ONDANSETRON HCL 4 MG PO TABS
4.0000 mg | ORAL_TABLET | Freq: Four times a day (QID) | ORAL | Status: DC | PRN
  Administered 2019-02-02 – 2019-02-10 (×3): 4 mg via ORAL
  Filled 2019-01-30 (×3): qty 1

## 2019-01-30 MED ORDER — ACETAMINOPHEN 650 MG RE SUPP: 650.0000 mg | Freq: Four times a day (QID) | RECTAL | Status: DC | PRN

## 2019-01-30 MED ORDER — VANCOMYCIN HCL 10 G IV SOLR
2000.0000 mg | INTRAVENOUS | Status: DC
  Administered 2019-01-31: 2000 mg via INTRAVENOUS
  Filled 2019-01-30: qty 2000

## 2019-01-30 MED ORDER — ACETAMINOPHEN 325 MG PO TABS
650.0000 mg | ORAL_TABLET | Freq: Four times a day (QID) | ORAL | Status: DC | PRN
  Administered 2019-02-01 – 2019-02-14 (×6): 650 mg via ORAL
  Filled 2019-01-30 (×6): qty 2

## 2019-01-30 NOTE — Consult Note (Addendum)
WOC consult requested for arm wounds.[place] team is working remotely today; reviewed photos and progress notes in the EMR. CT scan indicates cellulitis to full thickness wounds.  Pt has bilat wounds with extensive areas of nonviable tissue.  One arm is 100% tightly adhered yellow slough.  Other arm is 50% eschar surrounding 50% red edematous wound bed.   Pt could benefit from surgical debridement of the nonviable tissue to promote healing.  This complex medical condition is beyond [facility] scope of practice.  Please consult hand surgery team for further recommendations and plan of care.  Secure chat message sent to primary team physician, Dr Zigmund Daniel.  Please re-consult if further assistance is needed.  Thank-you,  Julien Girt MSN, [place], [place], [place], [place]

## 2019-01-30 NOTE — Progress Notes (Signed)
Patient was transferred to [name] at [place]. Asleep. Sitter at bedside. R forearm cellulitis, dressing changed on 01/29/2019. IVC is completed in ED.

## 2019-01-30 NOTE — Progress Notes (Signed)
Pharmacy Antibiotic Note  Tyrone Jenkins is a 38 y.o. male admitted on 01/27/2019 with schizophrenia.  He was noted to have chronic wounds on left wrist upon admission.  Prior to transfer to psychiatric facility today, RN noticed cellulitis had worsened with necrotic appearance, cellulitis.  CT was negative for osteomyelitis.  Pharmacy has been consulted for Vancomycin dosing.  Afebrile, WBC wnl, SCr 1.11 on admit > increased to 1.53 (adj Vanc dose)  Plan: Vancomycin 2gm loading dose (charted 11/9 at 2345), then 2gm q24 (start 11/11 at 0600) AUC 482, SCr 1.53 Daily SCr Monitor renal function and cx data   Height: 6' 0.01" (182.9 cm) Weight: 200 lb (90.7 kg) IBW/kg (Calculated) : 77.62  Temp (24hrs), Avg:98.5 F (36.9 C), Min:97.3 F (36.3 C), Max:99.9 F (37.7 C)  Recent Labs  Lab 01/27/19 0919 01/29/19 1501 01/30/19 0515  WBC 14.5* 10.4 10.0  CREATININE 1.43* 1.11 1.53*    Estimated Creatinine Clearance: 71.1 mL/min (A) (by C-G formula based on SCr of 1.53 mg/dL (H)).    No Known Allergies  Antimicrobials this admission: 11/9 Vanc >>  11/9 Zosyn, Clinda, Rocephin IM 11/8>>cephalexin>>11/9  Dose adjustments this admission: 11/10 dose change from 1250mg  q12, Scr 1.11, AUC 446 to 2gm q24 hr  Microbiology results: 11/7 COVID: negative  Thank you for allowing pharmacy to be a part of this patient's care.  Minda Ditto, PharmD, 01/30/2019 11:00 AM

## 2019-01-30 NOTE — Progress Notes (Signed)
PROGRESS NOTE    Tyrone Jenkins  MBW:[id number] DOB: 1980/03/31 DOA: 01/27/2019 PCP: Patient, No Pcp Per   Brief Narrative: 38 y.o. male with medical history significant of schizophrenia presenting with acute on chronic wrist wounds.  Patient was found walking around on the road on 11/7 and was noted to have abrasions on his R wrist and under chin.  He was also found to have a large open wound to his left wrist with uneven edges, partial necrosis, and mild surrounding erythema.  Wet to dry dressings were applied.  With further evaluation, the patient was noted to have h/o schizophrenia and has spells where he doesn't talk.  Over the next few days, the thought was the surrounding erythema from his wounds may have been related to wrist restraints.  IVC paperwork was completed and he was planned to go to [facility].  However, prior to the transfer this evening, the nurse was concerned about his wounds and CT appeared to confirm cellulitis.  Hand surgery was consulted and may need to be consulted tomorrow AM for further evaluation.    ED Course:  Schizophrenia.  Seen yesterday for a wound then but now markedly worse.  Necrotic, surrounding erythema, leukocytosis actually improved.  Likely needs at least obs.  Dr. Janee Morn wasn't overly concerned.   Assessment & Plan:   Principal Problem:   Cellulitis of arm Active Problems:   Schizophrenia (HCC)  #1 bilateral arm cellulitis with bilateral wounds with necrotic areas.  Patient with possible self injury to wrist.  Discussed with Dr. Ulice Bold who is willing to see the patient.  Appreciate her input.  Continue vancomycin.  Discussed with patient's mother.  #2 history of schizophrenia patient needs to be discharged to behavioral health once medically stable.  Patient has been IVCD.  He needs one-to-one Recruitment consultant.  Per mom patient lives alone in a condo.  He had a job prior to admission to the hospital.  Continue Cogentin Ativan and Haldol.  #3  hypokalemia replete potassium 3.2  #4 AKI creatinine increased to 1.53 from 1.11.  Monitor closely on vancomycin continue IV fluids and recheck labs tomorrow.   Estimated body mass index is 27.12 kg/m as calculated from the following:   Height as of this encounter: 6' 0.01" (1.829 m).   Weight as of this encounter: 90.7 kg.  DVT prophylaxis:  Lovenox  Code Status:  Full  Family Communication: Discussed with mother Tyrone Jenkins Plan:  Transfer to [facility] once clinically improved Consults called: Plastic surgery Dr. Ulice Bold Subjective: Patient resting in bed with his eyes closed a sitter by the bedside does not open his eyes or speak to me  Objective: Vitals:   01/29/19 2044 01/30/19 0226 01/30/19 0242 01/30/19 0525  BP: 114/81 123/76 123/76 (!) 141/84  Pulse: 74 82 82 69  Resp: 15 16 16 18   Temp: 99.9 F (37.7 C) 97.9 F (36.6 C) 97.9 F (36.6 C) 98.8 F (37.1 C)  TempSrc: Oral  Oral Oral  SpO2: 93% 96%  96%  Weight:   90.7 kg   Height:   6' 0.01" (1.829 m)     Intake/Output Summary (Last 24 hours) at 01/30/2019 1248 Last data filed at 01/30/2019 0900 Gross per 24 hour  Intake 605.37 ml  Output 250 ml  Net 355.37 ml   Filed Weights   01/27/19 0846 01/30/19 0242  Weight: 90.7 kg 90.7 kg    Examination:  General exam: Appears calm and comfortable  Respiratory system: Clear to auscultation. Respiratory  effort normal. Cardiovascular system: S1 & S2 heard, RRR. No JVD, murmurs, rubs, gallops or clicks. No pedal edema. Gastrointestinal system: Abdomen is nondistended, soft and nontender. No organomegaly or masses felt. Normal bowel sounds heard. Central nervous system: Alert and oriented. No focal neurological deficits. Extremities the wrist are covered with dressings.  See epic picture.   Skin: Bilateral wrist wounds   Data Reviewed: I have personally reviewed following labs and imaging studies  CBC: Recent Labs  Lab 01/27/19 0919 01/29/19 1501  01/30/19 0515  WBC 14.5* 10.4 10.0  NEUTROABS 11.4* 6.3  --   HGB 14.9 14.3 13.6  HCT 45.3 42.9 41.2  MCV 88.8 89.9 90.7  PLT 309 273 341   Basic Metabolic Panel: Recent Labs  Lab 01/27/19 0919 01/29/19 1501 01/30/19 0515  NA 141 141 141  K 3.3* 3.1* 3.2*  CL 100 105 104  CO2 25 27 23   GLUCOSE 111* 102* 97  BUN 30* 23* 25*  CREATININE 1.43* 1.11 1.53*  CALCIUM 9.6 8.8* 8.6*   GFR: Estimated Creatinine Clearance: 71.1 mL/min (A) (by C-G formula based on SCr of 1.53 mg/dL (H)). Liver Function Tests: Recent Labs  Lab 01/27/19 0919 01/29/19 1501  AST 34 29  ALT 25 23  ALKPHOS 57 56  BILITOT 3.6* 2.2*  PROT 7.7 7.0  ALBUMIN 5.0 4.3   No results for input(s): LIPASE, AMYLASE in the last 168 hours. No results for input(s): AMMONIA in the last 168 hours. Coagulation Profile: No results for input(s): INR, PROTIME in the last 168 hours. Cardiac Enzymes: No results for input(s): CKTOTAL, CKMB, CKMBINDEX, TROPONINI in the last 168 hours. BNP (last 3 results) No results for input(s): PROBNP in the last 8760 hours. HbA1C: No results for input(s): HGBA1C in the last 72 hours. CBG: No results for input(s): GLUCAP in the last 168 hours. Lipid Profile: No results for input(s): CHOL, HDL, LDLCALC, TRIG, CHOLHDL, LDLDIRECT in the last 72 hours. Thyroid Function Tests: No results for input(s): TSH, T4TOTAL, FREET4, T3FREE, THYROIDAB in the last 72 hours. Anemia Panel: No results for input(s): VITAMINB12, FOLATE, FERRITIN, TIBC, IRON, RETICCTPCT in the last 72 hours. Sepsis Labs: No results for input(s): PROCALCITON, LATICACIDVEN in the last 168 hours.  Recent Results (from the past 240 hour(s))  SARS CORONAVIRUS 2 (TAT 6-24 HRS) Nasopharyngeal Nasopharyngeal Swab     Status: None   Collection Time: 01/27/19 12:19 PM   Specimen: Nasopharyngeal Swab  Result Value Ref Range Status   SARS Coronavirus 2 NEGATIVE NEGATIVE Final    Comment: (NOTE) SARS-CoV-2 target nucleic acids  are NOT DETECTED. The SARS-CoV-2 RNA is generally detectable in upper and lower respiratory specimens during the acute phase of infection. Negative results do not preclude SARS-CoV-2 infection, do not rule out co-infections with other pathogens, and should not be used as the sole basis for treatment or other patient management decisions. Negative results must be combined with clinical observations, patient history, and epidemiological information. The expected result is Negative. Fact Sheet for Patients: SugarRoll.be Fact Sheet for Healthcare Providers: https://www.woods-.com/ This test is not yet approved or cleared by the [place] FDA and  has been authorized for detection and/or diagnosis of SARS-CoV-2 by FDA under an Emergency Use Authorization (EUA). This EUA will remain  in effect (meaning this test can be used) for the duration of the COVID-19 declaration under Section 56 4(b)(1) of the Act, 21 U.S.C. section 360bbb-3(b)(1), unless the authorization is terminated or revoked sooner. Performed at [facility], [address] [place], [place] [address]          Radiology Studies: Ct Forearm Left Wo Contrast  Result Date: 01/29/2019 CLINICAL DATA:  Gangrenous wound of the left forearm EXAM: CT OF THE LEFT FOREARM WITHOUT CONTRAST TECHNIQUE: Multidetector CT imaging was performed according to the standard protocol. Multiplanar CT image reconstructions were also generated. COMPARISON:  X-ray 01/27/2019 FINDINGS: Bones/Joint/Cartilage No acute fracture or dislocation. No cortical erosion or periostitis. No focal bone lesion. Joint spaces of the elbow and wrist are maintained. No elbow joint effusion. Ligaments Suboptimally assessed by CT. Muscles and Tendons Muscular bulk is maintained without atrophy or fatty infiltration. Tendinous structures intact within the limitations of this exam. No large tenosynovial fluid collection  is evident. Soft tissues There is skin defect overlying the radial aspect of the distal forearm at the level of the distal radial metaphysis with underlying soft tissue induration (series 3, image 47). No underlying fluid collection or soft tissue gas. There is mild subcutaneous edema within the surrounding tissues. No deep fascial fluid is evident by CT. No radiopaque soft tissue foreign bodies. IMPRESSION: 1. Skin defect overlying the radial aspect of the distal forearm at the level of the distal radial metaphysis with underlying soft tissue induration compatible with known ulceration and associated cellulitis. No underlying fluid collection/abscess or soft tissue gas. 2. No acute osseous abnormality or CT evidence of osteomyelitis. Electronically Signed   By: Duanne GuessNicholas  Plundo M.D.   On: 01/29/2019 21:44        Scheduled Meds:  benztropine  1 mg Oral Daily   Or   benztropine mesylate  1 mg Intramuscular Daily   docusate sodium  100 mg Oral BID   enoxaparin (LOVENOX) injection  40 mg Subcutaneous Q24H   haloperidol  5 mg Oral Daily   Or   haloperidol lactate  5 mg Intramuscular Daily   LORazepam  2 mg Oral Daily   Or   LORazepam  2 mg Intramuscular Daily   Continuous Infusions:  sodium chloride 100 mL/hr at 01/30/19 0156   [START ON 01/31/2019] vancomycin       LOS: 0 days     Alwyn RenElizabeth G Timm Bonenberger, MD [facility]   If 7PM-7AM, please contact night-coverage [email or web address] Password [email or web address] 01/30/2019, 12:48 PM

## 2019-01-30 NOTE — Consult Note (Signed)
Reason for Consult/CC: Bilateral wrist wounds  Tyrone Jenkins is an 38 y.o. male.  HPI: Patient came into the hospital out of concern for wound necrosis and inflammation around bilateral wrist wounds.  Patient has a psychiatric history and the etiology and duration of these wounds is unclear to me.  He does not respond when spoken to.  Imaging has been performed suggesting cellulitis without a deeper space infection.  He has been afebrile and nontoxic.  There is some concerned that these wounds might be related to previously placed wrist restraints.  Allergies: No Known Allergies  Medications:  Current Facility-Administered Medications:  .  0.9 %  sodium chloride infusion, , Intravenous, Continuous, Jonah Blue, MD, Last Rate: 100 mL/hr at 01/30/19 0156 .  acetaminophen (TYLENOL) tablet 650 mg, 650 mg, Oral, Q6H PRN **OR** acetaminophen (TYLENOL) suppository 650 mg, 650 mg, Rectal, Q6H PRN, Jonah Blue, MD .  benztropine (COGENTIN) tablet 1 mg, 1 mg, Oral, Daily, 1 mg at 01/30/19 0954 **OR** benztropine mesylate (COGENTIN) injection 1 mg, 1 mg, Intramuscular, Daily, Jonah Blue, MD .  docusate sodium (COLACE) capsule 100 mg, 100 mg, Oral, BID, Jonah Blue, MD, 100 mg at 01/30/19 0954 .  enoxaparin (LOVENOX) injection 40 mg, 40 mg, Subcutaneous, Q24H, Jonah Blue, MD .  haloperidol (HALDOL) tablet 5 mg, 5 mg, Oral, Daily, 5 mg at 01/30/19 0955 **OR** haloperidol lactate (HALDOL) injection 5 mg, 5 mg, Intramuscular, Daily, Jonah Blue, MD .  LORazepam (ATIVAN) tablet 2 mg, 2 mg, Oral, Daily, 2 mg at 01/30/19 0954 **OR** LORazepam (ATIVAN) injection 2 mg, 2 mg, Intramuscular, Daily, Jonah Blue, MD .  ondansetron [facility]) tablet 4 mg, 4 mg, Oral, Q6H PRN **OR** ondansetron (ZOFRAN) injection 4 mg, 4 mg, Intravenous, Q6H PRN, Jonah Blue, MD .  Melene Muller ON 01/31/2019] vancomycin (VANCOCIN) 2,000 mg in sodium chloride 0.9 % 500 mL IVPB, 2,000 mg, Intravenous, Q24H, Green,  Brand Males, RPH  Past Medical History:  Diagnosis Date  . Schizophrenia (HCC)     History reviewed. No pertinent surgical history.  History reviewed. No pertinent family history.  Social History:  has no history on file for tobacco, alcohol, and drug.  Review of systems: Not obtainable  Blood pressure (!) 142/91, pulse (!) 51, temperature 99.7 F (37.6 C), temperature source Oral, resp. rate 18, height 6' 0.01" (1.829 m), weight 90.7 kg, SpO2 95 %. Physical Exam   General: Nontoxic, no acute distress Left hand: Fingers are well perfused with normal capillary refill and a palpable radial pulse.  He appears to have good active range of motion but sensation cannot be assessed as he does not communicate.  He has approximately 6 cm diameter wound over the radial aspect of the distal forearm.  There is some necrotic eschar circumferentially.  There is some granulation tissue centrally.  There does not appear to be any fluctuance. Right hand: Fingers are well perfused with normal capillary refill and a palpable radial pulse.  He also seems to have reasonable range of motion in the fingers on this side.  On the volar aspect of the distal forearm there is a what appears to be an abrasion.  Some small scattered areas of this appear to have a full-thickness skin injury.  There is no surrounding skin induration or subcutaneous fluctuance.  Results for orders placed or performed during the hospital encounter of 01/27/19 (from the past 48 hour(s))  CBC with Differential/Platelet     Status: Abnormal   Collection Time: 01/29/19  3:01 PM  Result  Value Ref Range   WBC 10.4 4.0 - 10.5 K/uL   RBC 4.77 4.22 - 5.81 MIL/uL   Hemoglobin 14.3 13.0 - 17.0 g/dL   HCT 42.9 39.0 - 52.0 %   MCV 89.9 80.0 - 100.0 fL   MCH 30.0 26.0 - 34.0 pg   MCHC 33.3 30.0 - 36.0 g/dL   RDW 13.3 11.5 - 15.5 %   Platelets 273 150 - 400 K/uL   nRBC 0.0 0.0 - 0.2 %   Neutrophils Relative % 61 %   Neutro Abs 6.3 1.7 - 7.7 K/uL    Lymphocytes Relative 25 %   Lymphs Abs 2.6 0.7 - 4.0 K/uL   Monocytes Relative 12 %   Monocytes Absolute 1.3 (H) 0.1 - 1.0 K/uL   Eosinophils Relative 1 %   Eosinophils Absolute 0.1 0.0 - 0.5 K/uL   Basophils Relative 1 %   Basophils Absolute 0.1 0.0 - 0.1 K/uL   Immature Granulocytes 0 %   Abs Immature Granulocytes 0.01 0.00 - 0.07 K/uL    Comment: Performed at [facility], [address] [place], [place] [address]  Comprehensive metabolic panel     Status: Abnormal   Collection Time: 01/29/19  3:01 PM  Result Value Ref Range   Sodium 141 135 - 145 mmol/L   Potassium 3.1 (L) 3.5 - 5.1 mmol/L   Chloride 105 98 - 111 mmol/L   CO2 27 22 - 32 mmol/L   Glucose, Bld 102 (H) 70 - 99 mg/dL   BUN 23 (H) 6 - 20 mg/dL   Creatinine, Ser 1.11 0.61 - 1.24 mg/dL   Calcium 8.8 (L) 8.9 - 10.3 mg/dL   Total Protein 7.0 6.5 - 8.1 g/dL   Albumin 4.3 3.5 - 5.0 g/dL   AST 29 15 - 41 U/L   ALT 23 0 - 44 U/L   Alkaline Phosphatase 56 38 - 126 U/L   Total Bilirubin 2.2 (H) 0.3 - 1.2 mg/dL   GFR calc non Af Amer >60 >60 mL/min   GFR calc Af Amer >60 >60 mL/min   Anion gap 9 5 - 15    Comment: Performed at [facility], [address] [place], [place] [address]  HIV Antibody (routine testing w rflx)     Status: None   Collection Time: 01/30/19  5:15 AM  Result Value Ref Range   HIV Screen 4th Generation wRfx NON REACTIVE NON REACTIVE    Comment: Performed at [facility], [address] [place], [place] [address]  Basic metabolic panel     Status: Abnormal   Collection Time: 01/30/19  5:15 AM  Result Value Ref Range   Sodium 141 135 - 145 mmol/L   Potassium 3.2 (L) 3.5 - 5.1 mmol/L   Chloride 104 98 - 111 mmol/L   CO2 23 22 - 32 mmol/L   Glucose, Bld 97 70 - 99 mg/dL   BUN 25 (H) 6 - 20 mg/dL   Creatinine, Ser 1.53 (H) 0.61 - 1.24 mg/dL   Calcium 8.6 (L) 8.9 - 10.3 mg/dL   GFR calc non Af Amer 56 (L) >60 mL/min   GFR calc Af Amer >60 >60 mL/min    Anion gap 14 5 - 15    Comment: Performed at [facility], [address] [place], [place] [address]  CBC     Status: None   Collection Time: 01/30/19  5:15 AM  Result Value Ref Range   WBC   10.0 4.0 - 10.5 K/uL   RBC 4.54 4.22 - 5.81 MIL/uL   Hemoglobin 13.6 13.0 - 17.0 g/dL   HCT 41.2 39.0 - 52.0 %   MCV 90.7 80.0 - 100.0 fL   MCH 30.0 26.0 - 34.0 pg   MCHC 33.0 30.0 - 36.0 g/dL   RDW 13.2 11.5 - 15.5 %   Platelets 296 150 - 400 K/uL   nRBC 0.0 0.0 - 0.2 %    Comment: Performed at [facility], [place] [address], [place], [place] [address]    Ct Forearm Left Wo Contrast  Result Date: 01/29/2019 CLINICAL DATA:  Gangrenous wound of the left forearm EXAM: CT OF THE LEFT FOREARM WITHOUT CONTRAST TECHNIQUE: Multidetector CT imaging was performed according to the standard protocol. Multiplanar CT image reconstructions were also generated. COMPARISON:  X-ray 01/27/2019 FINDINGS: Bones/Joint/Cartilage No acute fracture or dislocation. No cortical erosion or periostitis. No focal bone lesion. Joint spaces of the elbow and wrist are maintained. No elbow joint effusion. Ligaments Suboptimally assessed by CT. Muscles and Tendons Muscular bulk is maintained without atrophy or fatty infiltration. Tendinous structures intact within the limitations of this exam. No large tenosynovial fluid collection is evident. Soft tissues There is skin defect overlying the radial aspect of the distal forearm at the level of the distal radial metaphysis with underlying soft tissue induration (series 3, image 47). No underlying fluid collection or soft tissue gas. There is mild subcutaneous edema within the surrounding tissues. No deep fascial fluid is evident by CT. No radiopaque soft tissue foreign bodies. IMPRESSION: 1. Skin defect overlying the radial aspect of the distal forearm at the level of the distal radial metaphysis with underlying soft tissue induration compatible with known  ulceration and associated cellulitis. No underlying fluid collection/abscess or soft tissue gas. 2. No acute osseous abnormality or CT evidence of osteomyelitis. Electronically Signed   By: Davina Poke M.D.   On: 01/29/2019 21:44    Assessment/Plan: Patient presenting with bilateral wrist wounds.  The one on the left would benefit from a debridement to remove the necrotic tissue.  Depending on the appearance of the wound intraoperatively he might also benefit from ACell placement to stimulate healing.  I am going to look into the process for getting consent in this situation as he does not speak.  If possible I could do this on Friday.  Cindra Presume 01/30/2019, 5:12 PM

## 2019-01-30 NOTE — H&P (View-Only) (Signed)
Reason for Consult/CC: Bilateral wrist wounds  Tyrone Jenkins is an 38 y.o. male.  HPI: Patient came into the hospital out of concern for wound necrosis and inflammation around bilateral wrist wounds.  Patient has a psychiatric history and the etiology and duration of these wounds is unclear to me.  He does not respond when spoken to.  Imaging has been performed suggesting cellulitis without a deeper space infection.  He has been afebrile and nontoxic.  There is some concerned that these wounds might be related to previously placed wrist restraints.  Allergies: No Known Allergies  Medications:  Current Facility-Administered Medications:  .  0.9 %  sodium chloride infusion, , Intravenous, Continuous, Jonah Blue, MD, Last Rate: 100 mL/hr at 01/30/19 0156 .  acetaminophen (TYLENOL) tablet 650 mg, 650 mg, Oral, Q6H PRN **OR** acetaminophen (TYLENOL) suppository 650 mg, 650 mg, Rectal, Q6H PRN, Jonah Blue, MD .  benztropine (COGENTIN) tablet 1 mg, 1 mg, Oral, Daily, 1 mg at 01/30/19 0954 **OR** benztropine mesylate (COGENTIN) injection 1 mg, 1 mg, Intramuscular, Daily, Jonah Blue, MD .  docusate sodium (COLACE) capsule 100 mg, 100 mg, Oral, BID, Jonah Blue, MD, 100 mg at 01/30/19 0954 .  enoxaparin (LOVENOX) injection 40 mg, 40 mg, Subcutaneous, Q24H, Jonah Blue, MD .  haloperidol (HALDOL) tablet 5 mg, 5 mg, Oral, Daily, 5 mg at 01/30/19 0955 **OR** haloperidol lactate (HALDOL) injection 5 mg, 5 mg, Intramuscular, Daily, Jonah Blue, MD .  LORazepam (ATIVAN) tablet 2 mg, 2 mg, Oral, Daily, 2 mg at 01/30/19 0954 **OR** LORazepam (ATIVAN) injection 2 mg, 2 mg, Intramuscular, Daily, Jonah Blue, MD .  ondansetron [facility]) tablet 4 mg, 4 mg, Oral, Q6H PRN **OR** ondansetron (ZOFRAN) injection 4 mg, 4 mg, Intravenous, Q6H PRN, Jonah Blue, MD .  Melene Muller ON 01/31/2019] vancomycin (VANCOCIN) 2,000 mg in sodium chloride 0.9 % 500 mL IVPB, 2,000 mg, Intravenous, Q24H, Green,  Brand Males, RPH  Past Medical History:  Diagnosis Date  . Schizophrenia (HCC)     History reviewed. No pertinent surgical history.  History reviewed. No pertinent family history.  Social History:  has no history on file for tobacco, alcohol, and drug.  Review of systems: Not obtainable  Blood pressure (!) 142/91, pulse (!) 51, temperature 99.7 F (37.6 C), temperature source Oral, resp. rate 18, height 6' 0.01" (1.829 m), weight 90.7 kg, SpO2 95 %. Physical Exam   General: Nontoxic, no acute distress Left hand: Fingers are well perfused with normal capillary refill and a palpable radial pulse.  He appears to have good active range of motion but sensation cannot be assessed as he does not communicate.  He has approximately 6 cm diameter wound over the radial aspect of the distal forearm.  There is some necrotic eschar circumferentially.  There is some granulation tissue centrally.  There does not appear to be any fluctuance. Right hand: Fingers are well perfused with normal capillary refill and a palpable radial pulse.  He also seems to have reasonable range of motion in the fingers on this side.  On the volar aspect of the distal forearm there is a what appears to be an abrasion.  Some small scattered areas of this appear to have a full-thickness skin injury.  There is no surrounding skin induration or subcutaneous fluctuance.  Results for orders placed or performed during the hospital encounter of 01/27/19 (from the past 48 hour(s))  CBC with Differential/Platelet     Status: Abnormal   Collection Time: 01/29/19  3:01 PM  Result  Value Ref Range   WBC 10.4 4.0 - 10.5 K/uL   RBC 4.77 4.22 - 5.81 MIL/uL   Hemoglobin 14.3 13.0 - 17.0 g/dL   HCT 96.042.9 45.439.0 - 09.852.0 %   MCV 89.9 80.0 - 100.0 fL   MCH 30.0 26.0 - 34.0 pg   MCHC 33.3 30.0 - 36.0 g/dL   RDW 11.913.3 14.711.5 - 82.915.5 %   Platelets 273 150 - 400 K/uL   nRBC 0.0 0.0 - 0.2 %   Neutrophils Relative % 61 %   Neutro Abs 6.3 1.7 - 7.7 K/uL    Lymphocytes Relative 25 %   Lymphs Abs 2.6 0.7 - 4.0 K/uL   Monocytes Relative 12 %   Monocytes Absolute 1.3 (H) 0.1 - 1.0 K/uL   Eosinophils Relative 1 %   Eosinophils Absolute 0.1 0.0 - 0.5 K/uL   Basophils Relative 1 %   Basophils Absolute 0.1 0.0 - 0.1 K/uL   Immature Granulocytes 0 %   Abs Immature Granulocytes 0.01 0.00 - 0.07 K/uL    Comment: Performed at [facility], [address] [place], [place] [address]  Comprehensive metabolic panel     Status: Abnormal   Collection Time: 01/29/19  3:01 PM  Result Value Ref Range   Sodium 141 135 - 145 mmol/L   Potassium 3.1 (L) 3.5 - 5.1 mmol/L   Chloride 105 98 - 111 mmol/L   CO2 27 22 - 32 mmol/L   Glucose, Bld 102 (H) 70 - 99 mg/dL   BUN 23 (H) 6 - 20 mg/dL   Creatinine, Ser 0.861.11 0.61 - 1.24 mg/dL   Calcium 8.8 (L) 8.9 - 10.3 mg/dL   Total Protein 7.0 6.5 - 8.1 g/dL   Albumin 4.3 3.5 - 5.0 g/dL   AST 29 15 - 41 U/L   ALT 23 0 - 44 U/L   Alkaline Phosphatase 56 38 - 126 U/L   Total Bilirubin 2.2 (H) 0.3 - 1.2 mg/dL   GFR calc non Af Amer >60 >60 mL/min   GFR calc Af Amer >60 >60 mL/min   Anion gap 9 5 - 15    Comment: Performed at [facility], [address] [place], [place] [address]  HIV Antibody (routine testing w rflx)     Status: None   Collection Time: 01/30/19  5:15 AM  Result Value Ref Range   HIV Screen 4th Generation wRfx NON REACTIVE NON REACTIVE    Comment: Performed at [facility], [address] [place], [place] [address]  Basic metabolic panel     Status: Abnormal   Collection Time: 01/30/19  5:15 AM  Result Value Ref Range   Sodium 141 135 - 145 mmol/L   Potassium 3.2 (L) 3.5 - 5.1 mmol/L   Chloride 104 98 - 111 mmol/L   CO2 23 22 - 32 mmol/L   Glucose, Bld 97 70 - 99 mg/dL   BUN 25 (H) 6 - 20 mg/dL   Creatinine, Ser 2.841.53 (H) 0.61 - 1.24 mg/dL   Calcium 8.6 (L) 8.9 - 10.3 mg/dL   GFR calc non Af Amer 56 (L) >60 mL/min   GFR calc Af Amer >60 >60 mL/min    Anion gap 14 5 - 15    Comment: Performed at [facility], [address] [place], [place] [address]  CBC     Status: None   Collection Time: 01/30/19  5:15 AM  Result Value Ref Range   WBC  10.0 4.0 - 10.5 K/uL   RBC 4.54 4.22 - 5.81 MIL/uL   Hemoglobin 13.6 13.0 - 17.0 g/dL   HCT 41.2 39.0 - 52.0 %   MCV 90.7 80.0 - 100.0 fL   MCH 30.0 26.0 - 34.0 pg   MCHC 33.0 30.0 - 36.0 g/dL   RDW 13.2 11.5 - 15.5 %   Platelets 296 150 - 400 K/uL   nRBC 0.0 0.0 - 0.2 %    Comment: Performed at [facility], [place] [address], [place], [place] [address]    Ct Forearm Left Wo Contrast  Result Date: 01/29/2019 CLINICAL DATA:  Gangrenous wound of the left forearm EXAM: CT OF THE LEFT FOREARM WITHOUT CONTRAST TECHNIQUE: Multidetector CT imaging was performed according to the standard protocol. Multiplanar CT image reconstructions were also generated. COMPARISON:  X-ray 01/27/2019 FINDINGS: Bones/Joint/Cartilage No acute fracture or dislocation. No cortical erosion or periostitis. No focal bone lesion. Joint spaces of the elbow and wrist are maintained. No elbow joint effusion. Ligaments Suboptimally assessed by CT. Muscles and Tendons Muscular bulk is maintained without atrophy or fatty infiltration. Tendinous structures intact within the limitations of this exam. No large tenosynovial fluid collection is evident. Soft tissues There is skin defect overlying the radial aspect of the distal forearm at the level of the distal radial metaphysis with underlying soft tissue induration (series 3, image 47). No underlying fluid collection or soft tissue gas. There is mild subcutaneous edema within the surrounding tissues. No deep fascial fluid is evident by CT. No radiopaque soft tissue foreign bodies. IMPRESSION: 1. Skin defect overlying the radial aspect of the distal forearm at the level of the distal radial metaphysis with underlying soft tissue induration compatible with known  ulceration and associated cellulitis. No underlying fluid collection/abscess or soft tissue gas. 2. No acute osseous abnormality or CT evidence of osteomyelitis. Electronically Signed   By: Davina Poke M.D.   On: 01/29/2019 21:44    Assessment/Plan: Patient presenting with bilateral wrist wounds.  The one on the left would benefit from a debridement to remove the necrotic tissue.  Depending on the appearance of the wound intraoperatively he might also benefit from ACell placement to stimulate healing.  I am going to look into the process for getting consent in this situation as he does not speak.  If possible I could do this on Friday.  Cindra Presume 01/30/2019, 5:12 PM

## 2019-01-30 NOTE — Progress Notes (Signed)
Pharmacy Antibiotic Note  Tyrone Jenkins is a 38 y.o. male admitted on 01/27/2019 with schizophrenia.  He was noted to have chronic wounds on left wrist upon admission.  Prior to transfer to psychiatric facility today, RN noticed cellulitis had worsened with necrotic appearance, cellulitis.  CT was negative for osteomyelitis.  Pharmacy has been consulted for Vancomycin dosing.  Afebrile, WBC WNL, Normal renal function  Plan: Vancomycin 1500mg  IV x1 then 1250mg  IV q12h to target AUC 400-550 Monitor renal function and cx data   Height: 6' (182.9 cm) Weight: 200 lb (90.7 kg) IBW/kg (Calculated) : 77.6  Temp (24hrs), Avg:98.8 F (37.1 C), Min:97.3 F (36.3 C), Max:99.9 F (37.7 C)  Recent Labs  Lab 01/27/19 0919 01/29/19 1501  WBC 14.5* 10.4  CREATININE 1.43* 1.11    Estimated Creatinine Clearance: 98.1 mL/min (by C-G formula based on SCr of 1.11 mg/dL).    No Known Allergies  Antimicrobials this admission: 11/9 Vanc >>  11/9 Zosyn, Clinda, Rocephin IM 11/8>>cephalexin>>11/9  Dose adjustments this admission:  Microbiology results: 11/7 COVID: negative  Thank you for allowing pharmacy to be a part of this patient's care.  Netta Cedars, PharmD, BCPS 01/30/2019 12:42 AM

## 2019-01-31 ENCOUNTER — Inpatient Hospital Stay (HOSPITAL_COMMUNITY): Payer: 59

## 2019-01-31 DIAGNOSIS — R7989 Other specified abnormal findings of blood chemistry: Secondary | ICD-10-CM

## 2019-01-31 LAB — BASIC METABOLIC PANEL
Anion gap: 9 (ref 5–15)
Anion gap: 9 (ref 5–15)
Anion gap: 10 (ref 5–15)
BUN: 34 mg/dL — ABNORMAL HIGH (ref 6–20)
BUN: 33 mg/dL — ABNORMAL HIGH (ref 6–20)
BUN: 36 mg/dL — ABNORMAL HIGH (ref 6–20)
CO2: 22 mmol/L (ref 22–32)
CO2: 21 mmol/L — ABNORMAL LOW (ref 22–32)
CO2: 22 mmol/L (ref 22–32)
Calcium: 7.6 mg/dL — ABNORMAL LOW (ref 8.9–10.3)
Calcium: 7.5 mg/dL — ABNORMAL LOW (ref 8.9–10.3)
Calcium: 7.7 mg/dL — ABNORMAL LOW (ref 8.9–10.3)
Chloride: 107 mmol/L (ref 98–111)
Chloride: 108 mmol/L (ref 98–111)
Chloride: 108 mmol/L (ref 98–111)
Creatinine, Ser: 4.57 mg/dL — ABNORMAL HIGH (ref 0.61–1.24)
Creatinine, Ser: 4.98 mg/dL — ABNORMAL HIGH (ref 0.61–1.24)
Creatinine, Ser: 5.71 mg/dL — ABNORMAL HIGH (ref 0.61–1.24)
GFR calc Af Amer: 17 mL/min — ABNORMAL LOW (ref >60)
GFR calc Af Amer: 16 mL/min — ABNORMAL LOW (ref >60)
GFR calc Af Amer: 13 mL/min — ABNORMAL LOW (ref >60)
GFR calc non Af Amer: 15 mL/min — ABNORMAL LOW (ref >60)
GFR calc non Af Amer: 14 mL/min — ABNORMAL LOW (ref >60)
GFR calc non Af Amer: 11 mL/min — ABNORMAL LOW (ref >60)
Glucose, Bld: 125 mg/dL — ABNORMAL HIGH (ref 70–99)
Glucose, Bld: 118 mg/dL — ABNORMAL HIGH (ref 70–99)
Glucose, Bld: 128 mg/dL — ABNORMAL HIGH (ref 70–99)
Potassium: 3 mmol/L — ABNORMAL LOW (ref 3.5–5.1)
Potassium: 3.3 mmol/L — ABNORMAL LOW (ref 3.5–5.1)
Potassium: 3.2 mmol/L — ABNORMAL LOW (ref 3.5–5.1)
Sodium: 138 mmol/L (ref 135–145)
Sodium: 138 mmol/L (ref 135–145)
Sodium: 140 mmol/L (ref 135–145)

## 2019-01-31 LAB — CBC
HCT: 36.8 % — ABNORMAL LOW (ref 39.0–52.0)
Hemoglobin: 12.3 g/dL — ABNORMAL LOW (ref 13.0–17.0)
MCH: 30.3 pg (ref 26.0–34.0)
MCHC: 33.4 g/dL (ref 30.0–36.0)
MCV: 90.6 fL (ref 80.0–100.0)
Platelets: 218 10*3/uL (ref 150–400)
RBC: 4.06 MIL/uL — ABNORMAL LOW (ref 4.22–5.81)
RDW: 13 % (ref 11.5–15.5)
WBC: 9.6 10*3/uL (ref 4.0–10.5)
nRBC: 0 % (ref 0.0–0.2)

## 2019-01-31 LAB — VANCOMYCIN, RANDOM: Vancomycin Rm: 47

## 2019-01-31 MED ORDER — METRONIDAZOLE IN NACL 5-0.79 MG/ML-% IV SOLN
500.0000 mg | Freq: Three times a day (TID) | INTRAVENOUS | Status: DC
  Administered 2019-01-31 – 2019-02-01 (×3): 500 mg via INTRAVENOUS
  Filled 2019-01-31 (×3): qty 100

## 2019-01-31 MED ORDER — SODIUM CHLORIDE 0.9 % IV SOLN
1.0000 g | INTRAVENOUS | Status: DC
  Administered 2019-02-01 – 2019-02-08 (×8): 1 g via INTRAVENOUS
  Filled 2019-01-31 (×4): qty 1
  Filled 2019-01-31: qty 10
  Filled 2019-01-31 (×4): qty 1

## 2019-01-31 MED ORDER — ENOXAPARIN SODIUM 30 MG/0.3ML ~~LOC~~ SOLN
30.0000 mg | SUBCUTANEOUS | Status: DC
  Administered 2019-02-11 – 2019-02-13 (×3): 30 mg via SUBCUTANEOUS
  Filled 2019-01-31 (×8): qty 0.3

## 2019-01-31 MED ORDER — VANCOMYCIN VARIABLE DOSE PER UNSTABLE RENAL FUNCTION (PHARMACIST DOSING): Status: DC

## 2019-01-31 NOTE — Progress Notes (Signed)
PROGRESS NOTE    Tyrone Jenkins  ZOX:[id number] DOB: [birth year] DOA: 01/27/2019 PCP: Patient, No Pcp Per   Brief Narrative:37 y.o.malewith medical history significant ofschizophrenia presenting with acute on chronic wrist wounds.Patient was found walking around on the road on 11/7 and was noted to have abrasions on his R wrist and under chin. He was also found to have a large open wound to his left wrist with uneven edges, partial necrosis, and mild surrounding erythema. Wet to dry dressings were applied. With further evaluation, the patient was noted to have h/o schizophrenia and has spells where he doesn't talk. Over the next few days, the thought was the surrounding erythema from his wounds may have been related to wrist restraints. IVC paperwork was completed and he was planned to [facility]. However, prior to the transfer this evening, the nurse was concerned about his wounds and CT appeared to confirm cellulitis. Hand surgery was consulted and may need to be consulted tomorrow AM for further evaluation.    ED Course:Schizophrenia. Seen yesterday for a wound then but now markedly worse. Necrotic, surrounding erythema, leukocytosis actually improved. Likely needs at least obs. Dr. Janee Jenkins wasn't overly concerned.  Assessment & Plan:   Principal Problem:   Cellulitis of arm Active Problems:   Schizophrenia (HCC)   Necrosis (HCC)   #1 bilateral arm cellulitis with bilateral wounds with necrotic areas.  Patient with possible self injury to wrist.  Consulted plastic surgery voice.  Take him to the OR on Thursday for debridement.   Discussed with patient's mother. Antibiotics changed to Rocephin and Flagyl from vancomycin 01/31/2019.  #2 history of schizophrenia patient needs to be discharged to behavioral health once medically stable.  Patient has been IVCD.  He needs one-to-one Recruitment consultantsafety sitter.  Per mom patient lives alone in a condo.  He had a job prior to  admission to the hospital.  Continue Cogentin Ativan and Haldol.  #3 hypokalemia  potassium 3.3 will not replete it with a higher creatinine.  #4 AKI creatinine increased to 4.98 today from 1.43 on 01/27/2019.   Unclear why it jumped up so quickly.  He has not been significantly hypotensive or hypertensive.  He has not received any contrast.  He was only on vancomycin which is stopped today because of the elevated creatinine.  Renal ultrasound ordered. Renal consulted.  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  Patient lacks understanding of the nature of his disease.  His mother Tyrone Jenkins is the next of kin.  She wants what is best for her son.  She wants her son to be treated for the infection with antibiotics and debridement.  She is aware if you do not get treated he can go into sepsis or loose his upper extremities   Estimated body mass index is 27.12 kg/m as calculated from the following:   Height as of this encounter: 6' 0.01" (1.829 m).   Weight as of this encounter: 90.7 kg.   DVT prophylaxis:Lovenox dose decreased to 30 mg daily due to AKI Code Status:Full  Family Communication: Discussed with mother Tyrone Jenkins [facility] Consults called: Plastic surgery Dr. Ulice Jenkins Antibiotics Rocephin and Flagyl starting 01/31/2019.  Subjective: He is resting in bed does not open his eyes or talk to me   Objective: Vitals:   01/30/19 0525 01/30/19 1518 01/30/19 2241 01/31/19 0529  BP: Marland Kitchen(!)  141/84 (!) 142/91 124/84 121/90  Pulse: 69 (!) 51 63 75  Resp: 18 18 17 18   Temp: 98.8 F (37.1 C) 99.7 F (37.6 C) 98.9 F (37.2 C) 99.3 F (37.4 C)  TempSrc: Oral Oral Oral Oral  SpO2: 96% 95% 96% 98%  Weight:      Height:        Intake/Output Summary (Last 24 hours) at 01/31/2019 1203 Last data filed at  01/31/2019 0600 Gross per 24 hour  Intake 2836.79 ml  Output 200 ml  Net 2636.79 ml   Filed Weights   01/27/19 0846 01/30/19 0242  Weight: 90.7 kg 90.7 kg    Examination:  General exam: Appears calm and comfortable  Respiratory system: Clear to auscultation. Respiratory effort normal. Cardiovascular system: S1 & S2 heard, RRR. No JVD, murmurs, rubs, gallops or clicks. No pedal edema. Gastrointestinal system: Abdomen is nondistended, soft and nontender. No organomegaly or masses felt. Normal bowel sounds heard. Central nervous system: Alert and oriented. No focal neurological deficits. Extremities: Bilateral wrist covered with dressings Skin: No rashes, lesions or ulcers Psychiatry: Judgement and insight appear normal. Mood & affect appropriate.     Data Reviewed: I have personally reviewed following labs and imaging studies  CBC: Recent Labs  Lab 01/27/19 0919 01/29/19 1501 01/30/19 0515 01/31/19 0547  WBC 14.5* 10.4 10.0 9.6  NEUTROABS 11.4* 6.3  --   --   HGB 14.9 14.3 13.6 12.3*  HCT 45.3 42.9 41.2 36.8*  MCV 88.8 89.9 90.7 90.6  PLT 309 273 296 218   Basic Metabolic Panel: Recent Labs  Lab 01/27/19 0919 01/29/19 1501 01/30/19 0515 01/31/19 0547 01/31/19 0804  NA 141 141 141 138 138  K 3.3* 3.1* 3.2* 3.0* 3.3*  CL 100 105 104 107 108  CO2 25 27 23 22  21*  GLUCOSE 111* 102* 97 125* 118*  BUN 30* 23* 25* 34* 33*  CREATININE 1.43* 1.11 1.53* 4.57* 4.98*  CALCIUM 9.6 8.8* 8.6* 7.6* 7.5*   GFR: Estimated Creatinine Clearance: 21.9 mL/min (A) (by C-G formula based on SCr of 4.98 mg/dL (H)). Liver Function Tests: Recent Labs  Lab 01/27/19 0919 01/29/19 1501  AST 34 29  ALT 25 23  ALKPHOS 57 56  BILITOT 3.6* 2.2*  PROT 7.7 7.0  ALBUMIN 5.0 4.3   No results for input(s): LIPASE, AMYLASE in the last 168 hours. No results for input(s): AMMONIA in the last 168 hours. Coagulation Profile: No results for input(s): INR, PROTIME in the last 168  hours. Cardiac Enzymes: No results for input(s): CKTOTAL, CKMB, CKMBINDEX, TROPONINI in the last 168 hours. BNP (last 3 results) No results for input(s): PROBNP in the last 8760 hours. HbA1C: No results for input(s): HGBA1C in the last 72 hours. CBG: No results for input(s): GLUCAP in the last 168 hours. Lipid Profile: No results for input(s): CHOL, HDL, LDLCALC, TRIG, CHOLHDL, LDLDIRECT in the last 72 hours. Thyroid Function Tests: No results for input(s): TSH, T4TOTAL, FREET4, T3FREE, THYROIDAB in the last 72 hours. Anemia Panel: No results for input(s): VITAMINB12, FOLATE, FERRITIN, TIBC, IRON, RETICCTPCT in the last 72 hours. Sepsis Labs: No results for input(s): PROCALCITON, LATICACIDVEN in the last 168 hours.  Recent Results (from the past 240 hour(s))  SARS CORONAVIRUS 2 (TAT 6-24 HRS) Nasopharyngeal Nasopharyngeal Swab     Status: None   Collection Time: 01/27/19 12:19 PM   Specimen: Nasopharyngeal Swab  Result Value Ref Range Status   SARS Coronavirus 2 NEGATIVE NEGATIVE Final    Comment: (  NOTE) SARS-CoV-2 target nucleic acids are NOT DETECTED. The SARS-CoV-2 RNA is generally detectable in upper and lower respiratory specimens during the acute phase of infection. Negative results do not preclude SARS-CoV-2 infection, do not rule out co-infections with other pathogens, and should not be used as the sole basis for treatment or other patient management decisions. Negative results must be combined with clinical observations, patient history, and epidemiological information. The expected result is Negative. Fact Sheet for Patients: SugarRoll.be Fact Sheet for Healthcare Providers: https://www.woods[employer].com/ This test is not yet approved or cleared by the [place] FDA and  has been authorized for detection and/or diagnosis of SARS-CoV-2 by FDA under an Emergency Use Authorization (EUA). This EUA will remain  in effect  (meaning this test can be used) for the duration of the COVID-19 declaration under Section 56 4(b)(1) of the Act, 21 U.S.C. section 360bbb-3(b)(1), unless the authorization is terminated or revoked sooner. Performed at [facility], [place] [address] [place], [place] [address]          Radiology Studies: Ct Forearm Left Wo Contrast  Result Date: 01/29/2019 CLINICAL DATA:  Gangrenous wound of the left forearm EXAM: CT OF THE LEFT FOREARM WITHOUT CONTRAST TECHNIQUE: Multidetector CT imaging was performed according to the standard protocol. Multiplanar CT image reconstructions were also generated. COMPARISON:  X-ray 01/27/2019 FINDINGS: Bones/Joint/Cartilage No acute fracture or dislocation. No cortical erosion or periostitis. No focal bone lesion. Joint spaces of the elbow and wrist are maintained. No elbow joint effusion. Ligaments Suboptimally assessed by CT. Muscles and Tendons Muscular bulk is maintained without atrophy or fatty infiltration. Tendinous structures intact within the limitations of this exam. No large tenosynovial fluid collection is evident. Soft tissues There is skin defect overlying the radial aspect of the distal forearm at the level of the distal radial metaphysis with underlying soft tissue induration (series 3, image 47). No underlying fluid collection or soft tissue gas. There is mild subcutaneous edema within the surrounding tissues. No deep fascial fluid is evident by CT. No radiopaque soft tissue foreign bodies. IMPRESSION: 1. Skin defect overlying the radial aspect of the distal forearm at the level of the distal radial metaphysis with underlying soft tissue induration compatible with known ulceration and associated cellulitis. No underlying fluid collection/abscess or soft tissue gas. 2. No acute osseous abnormality or CT evidence of osteomyelitis. Electronically Signed   By: Davina Poke M.D.   On: 01/29/2019 21:44        Scheduled Meds:  benztropine  1  mg Oral Daily   Or   benztropine mesylate  1 mg Intramuscular Daily   docusate sodium  100 mg Oral BID   enoxaparin (LOVENOX) injection  40 mg Subcutaneous Q24H   haloperidol  5 mg Oral Daily   Or   haloperidol lactate  5 mg Intramuscular Daily   LORazepam  2 mg Oral Daily   Or   LORazepam  2 mg Intramuscular Daily   vancomycin variable dose per unstable renal function (pharmacist dosing)   Does not apply See admin instructions   Continuous Infusions:  sodium chloride 100 mL/hr at 01/30/19 2249     LOS: 1 day     Georgette Shell, MD [facility] If 7PM-7AM, please contact night-coverage [email or web address] Password [email or web address] 01/31/2019, 12:03 PM

## 2019-01-31 NOTE — Clinical Social Work Note (Signed)
Confirmed with Daymon Larsen at [facility] that they plan on admitting patient when medically cleared.

## 2019-02-01 LAB — URINALYSIS, ROUTINE W REFLEX MICROSCOPIC
Bilirubin Urine: NEGATIVE
Glucose, UA: NEGATIVE mg/dL
Hgb urine dipstick: NEGATIVE
Ketones, ur: NEGATIVE mg/dL
Leukocytes,Ua: NEGATIVE
Nitrite: NEGATIVE
Protein, ur: 100 mg/dL — AB
Specific Gravity, Urine: 1.008 (ref 1.005–1.030)
pH: 6 (ref 5.0–8.0)

## 2019-02-01 LAB — SODIUM, URINE, RANDOM: Sodium, Ur: 23 mmol/L

## 2019-02-01 LAB — CREATININE, URINE, RANDOM: Creatinine, Urine: 153.04 mg/dL

## 2019-02-01 LAB — BASIC METABOLIC PANEL
Anion gap: 11 (ref 5–15)
BUN: 36 mg/dL — ABNORMAL HIGH (ref 6–20)
CO2: 19 mmol/L — ABNORMAL LOW (ref 22–32)
Calcium: 7.6 mg/dL — ABNORMAL LOW (ref 8.9–10.3)
Chloride: 107 mmol/L (ref 98–111)
Creatinine, Ser: 6.8 mg/dL — ABNORMAL HIGH (ref 0.61–1.24)
GFR calc Af Amer: 11 mL/min — ABNORMAL LOW (ref >60)
GFR calc non Af Amer: 9 mL/min — ABNORMAL LOW (ref >60)
Glucose, Bld: 105 mg/dL — ABNORMAL HIGH (ref 70–99)
Potassium: 3.5 mmol/L (ref 3.5–5.1)
Sodium: 137 mmol/L (ref 135–145)

## 2019-02-01 MED ORDER — LORAZEPAM 2 MG/ML IJ SOLN
2.0000 mg | Freq: Once | INTRAMUSCULAR | Status: AC
  Administered 2019-02-01: 2 mg via INTRAVENOUS
  Filled 2019-02-01: qty 1

## 2019-02-01 NOTE — Consult Note (Signed)
Renal Service Consult Note [facility]  Tyrone Jenkins 02/01/2019 Tyrone Jenkins Requesting Physician:  Dr Loleta Books  Reason for Consult:  AKI  HPI: The patient is a 38 y.o. year-old w/ hx of schizophrenia found out on a highway w/ wounds on his bilat wrists, was admitted for soft tissue infection and was started on IV abx w/ vancomycin on 11/09.  Creat was 1.11 on admission then increased to 4.57 yest and 6.80 today. Renal US was negative for any hydronephrosis. UA on 11/12 showed 100 prot and no cells. Asked to see for renal failure.   Pt denies any prior hx of renal failure. Is making about 500 cc / day of urine here, no n/v/d, no abd pain , no voiding issues. Renal US normal done yesterday. .   ROS  denies CP  no joint pain   no HA  no blurry vision  no rash  no diarrhea  no nausea/ vomiting  no dysuria  no difficulty voiding  no change in urine color    Past Medical History  Past Medical History:  Diagnosis Date  . Schizophrenia [facility])    Past Surgical History History reviewed. No pertinent surgical history. Family History History reviewed. No pertinent family history. Social History  has no history on file for tobacco, alcohol, and drug. Allergies No Known Allergies Home medications Prior to Admission medications   Not on File   Liver Function Tests Recent Labs  Lab 01/27/19 0919 01/29/19 1501  AST 34 29  ALT 25 23  ALKPHOS 57 56  BILITOT 3.6* 2.2*  PROT 7.7 7.0  ALBUMIN 5.0 4.3   No results for input(s): LIPASE, AMYLASE in the last 168 hours. CBC Recent Labs  Lab 01/27/19 0919 01/29/19 1501 01/30/19 0515 01/31/19 0547  WBC 14.5* 10.4 10.0 9.6  NEUTROABS 11.4* 6.3  --   --   HGB 14.9 14.3 13.6 12.3*  HCT 45.3 42.9 41.2 36.8*  MCV 88.8 89.9 90.7 90.6  PLT 309 273 296 093   Basic Metabolic Panel Recent Labs  Lab 01/27/19 0919 01/29/19 1501 01/30/19 0515 01/31/19 0547 01/31/19 0804 01/31/19 1827 02/01/19 0533  NA 141 141  141 138 138 140 137  K 3.3* 3.1* 3.2* 3.0* 3.3* 3.2* 3.5  CL 100 105 104 107 108 108 107  CO2 25 27 23 22  21* 22 19*  GLUCOSE 111* 102* 97 125* 118* 128* 105*  BUN 30* 23* 25* 34* 33* 36* 36*  CREATININE 1.43* 1.11 1.53* 4.57* 4.98* 5.71* 6.80*  CALCIUM 9.6 8.8* 8.6* 7.6* 7.5* 7.7* 7.6*   Iron/TIBC/Ferritin/ %Sat No results found for: IRON, TIBC, FERRITIN, IRONPCTSAT  Vitals:   01/31/19 0529 01/31/19 1334 02/01/19 0529 02/01/19 1502  BP: 121/90 132/72 121/89 (!) 143/98  Pulse: 75 60 (!) 59 (!) 43  Resp: 18 16 16 18   Temp: 99.3 F (37.4 C) 99.1 F (37.3 C) (!) 97.2 F (36.2 C) 99.2 F (37.3 C)  TempSrc: Oral Oral Oral Oral  SpO2: 98% 97% 96% 96%  Weight:      Height:        Exam Gen pleasant, no distress No rash, cyanosis or gangrene Sclera anicteric, throat clear  No jvd or bruits Chest clear bilat to bases  RRR no MRG  Abd soft ntnd no mass or ascites +bs GU normal male MS no joint effusions or deformity Ext no LE or UE edema, no wounds or ulcers Neuro is alert, Ox 3 , nf    Home meds:  -  none   Renal US - negative UA 100 prot, 0-5 wbc/ rbc  Assessment/ Plan: 1. AKI - severe, rapid rise creat. Check CPK but lft's aren't up so rhabdo less likely .  Default is vanc toxicity, level 47 yesterday.  Vanc IV well known renal nephrotoxin.  No uremic symptoms. If creat up again today would transfer to [facility] to get ready for dialysis if needed.  Agree w/ IVF"s no vol overload on exam and we want him to be a little vol expanded to facilitate recovery.  Will follow. Have explained to pt and answered questions.  2. Schizophrenia 3. Bilat wrist wounds/ cellulitis - on flagyl and rocephin now      Vinson Moselle  MD 02/01/2019, 4:40 PM

## 2019-02-01 NOTE — Progress Notes (Signed)
PROGRESS NOTE    Tyrone Jenkins  WUJ:[id number] DOB: 12-Dec-1980 DOA: 01/27/2019 PCP: Patient, No Pcp Per      Brief Narrative:  [name] is a 38 y.o. M with schizophrenia who presented with being found wandering.  From H&P: "Patient was found walking around on the road on 11/7 and was noted to have abrasions on his R wrist and under chin.  He was also found to have a large open wound to his left wrist with uneven edges, partial necrosis, and mild surrounding erythema.  Wet to dry dressings were applied.  With further evaluation, the patient was noted to have h/o schizophrenia and has spells where he doesn't talk.  Over the next few days, the thought was the surrounding erythema from his wounds may have been related to wrist restraints.  IVC paperwork was completed and he was planned to [facility] to [facility].  However, prior to the transfer this evening, the nurse was concerned about his wounds and CT appeared to confirm cellulitis.  Hand surgery was consulted."    Assessment & Plan:  Wrist cellulitis Was given Keflex in the ER, then started on Vanco, Zosyn, and clindamycin at the time of admission.  11/11 his creatinine was checked and he had renal failure and so antibiotics were switched to Flagyl it appears. -Start ceftriaxone -Stop Flagyl -Consult plastic surgery, appreciate expert attentions, their note suggests they anticipate surgery   Acute renal failure Creatinine baseline appears to be around 1.1-1.4.   From 11/9 to 11/10 it jumped from 1.5 to 4.5 and has been climbing. Today up to 6.8 mg/dL, no improvement with fluids UOP 500 last 24 hours. UA shows bland cases, no cells. Renal US showed no hydronephrosis. -Check urine electrolytes -Strict I/Os -Continue IV fluids for now -Consult Nephrology, appreciate cares  Schizophrenia Under IVC -Continue Ativan, Cogentin, Haldol -[facility]            MDM and disposition: The below labs and imaging reports  were reviewed and summarized above.  Medication management as above.  The patient was admitted with wrist cellulitis.  He subsequently developed renal failure.    We will consult nephrology as his creatinine is worsening, and expand work-up of his renal failure.  He will need continued ongoing medical management and work-up of his renal failure prior to discharge to behavioral health.      DVT prophylaxis: Lovenox Code Status: Full code Family Communication: Mother by phone    Consultants:   Hand surgery  Nephrology  TTS  Procedures:   11/9 CT forearm - cellultiis of wrist  11/11 US renal - unremarkable  Antimicrobials:   Vancomycin 11/9-11/10  Flagyl x1 11/11  Clindamycin x1 11/9  Zosyn x111/9  Ceftriaxone IM x1 11/9, restarted 11/12 >>   Subjective: No swelling, dyspnea, orthopnea, chest pain.  His redness on his wrist is improving.  He has no fever.  Objective: Vitals:   01/30/19 2241 01/31/19 0529 01/31/19 1334 02/01/19 0529  BP: 124/84 121/90 132/72 121/89  Pulse: 63 75 60 (!) 59  Resp: 17 18 16 16   Temp: 98.9 F (37.2 C) 99.3 F (37.4 C) 99.1 F (37.3 C) (!) 97.2 F (36.2 C)  TempSrc: Oral Oral Oral Oral  SpO2: 96% 98% 97% 96%  Weight:      Height:        Intake/Output Summary (Last 24 hours) at 02/01/2019 1013 Last data filed at 02/01/2019 0600 Gross per 24 hour  Intake 2230.03 ml  Output  500 ml  Net 1730.03 ml   Filed Weights   01/27/19 0846 01/30/19 0242  Weight: 90.7 kg 90.7 kg    Examination: General appearance: Well-nourished adult male, alert and in no acute distress.  Lying in bed. HEENT: Anicteric, conjunctiva pink, lids and lashes normal. No nasal deformity, discharge, epistaxis.  Lips moist, dentition normal, oropharynx moist, no oral lesions, hearing normal.   Skin: Warm and dry.  No jaundice.  Redness has receded around the bilateral arm wounds which are pictured below     Cardiac: RRR, nl S1-S2, no murmurs  appreciated.  Capillary refill is brisk.  JVP normal.  No LE edema.  Radial pulses 2+ and symmetric. Respiratory: Normal respiratory rate and rhythm.  CTAB without rales or wheezes. Abdomen: Abdomen soft.  No TTP or guarding. No ascites, distension, hepatosplenomegaly.   MSK: No deformities or effusions. Neuro: Awake and alert.  EOMI, moves all extremities. Speech fluent.    Psych: Sensorium intact and responding to questions, attention normal. Affect blunted.  Judgment and insight appear normal.  Does not appear to be responding to internal stimuli.    Data Reviewed: I have personally reviewed following labs and imaging studies:  CBC: Recent Labs  Lab 01/27/19 0919 01/29/19 1501 01/30/19 0515 01/31/19 0547  WBC 14.5* 10.4 10.0 9.6  NEUTROABS 11.4* 6.3  --   --   HGB 14.9 14.3 13.6 12.3*  HCT 45.3 42.9 41.2 36.8*  MCV 88.8 89.9 90.7 90.6  PLT 309 273 296 664   Basic Metabolic Panel: Recent Labs  Lab 01/30/19 0515 01/31/19 0547 01/31/19 0804 01/31/19 1827 02/01/19 0533  NA 141 138 138 140 137  K 3.2* 3.0* 3.3* 3.2* 3.5  CL 104 107 108 108 107  CO2 23 22 21* 22 19*  GLUCOSE 97 125* 118* 128* 105*  BUN 25* 34* 33* 36* 36*  CREATININE 1.53* 4.57* 4.98* 5.71* 6.80*  CALCIUM 8.6* 7.6* 7.5* 7.7* 7.6*   GFR: Estimated Creatinine Clearance: 16 mL/min (A) (by C-G formula based on SCr of 6.8 mg/dL (H)). Liver Function Tests: Recent Labs  Lab 01/27/19 0919 01/29/19 1501  AST 34 29  ALT 25 23  ALKPHOS 57 56  BILITOT 3.6* 2.2*  PROT 7.7 7.0  ALBUMIN 5.0 4.3   No results for input(s): LIPASE, AMYLASE in the last 168 hours. No results for input(s): AMMONIA in the last 168 hours. Coagulation Profile: No results for input(s): INR, PROTIME in the last 168 hours. Cardiac Enzymes: No results for input(s): CKTOTAL, CKMB, CKMBINDEX, TROPONINI in the last 168 hours. BNP (last 3 results) No results for input(s): PROBNP in the last 8760 hours. HbA1C: No results for input(s):  HGBA1C in the last 72 hours. CBG: No results for input(s): GLUCAP in the last 168 hours. Lipid Profile: No results for input(s): CHOL, HDL, LDLCALC, TRIG, CHOLHDL, LDLDIRECT in the last 72 hours. Thyroid Function Tests: No results for input(s): TSH, T4TOTAL, FREET4, T3FREE, THYROIDAB in the last 72 hours. Anemia Panel: No results for input(s): VITAMINB12, FOLATE, FERRITIN, TIBC, IRON, RETICCTPCT in the last 72 hours. Urine analysis:    Component Value Date/Time   COLORURINE YELLOW 02/01/2019 0618   APPEARANCEUR CLEAR 02/01/2019 0618   LABSPEC 1.008 02/01/2019 0618   PHURINE 6.0 02/01/2019 0618   GLUCOSEU NEGATIVE 02/01/2019 0618   HGBUR NEGATIVE 02/01/2019 0618   BILIRUBINUR NEGATIVE 02/01/2019 0618   KETONESUR NEGATIVE 02/01/2019 0618   PROTEINUR 100 (A) 02/01/2019 0618   NITRITE NEGATIVE 02/01/2019 0618   LEUKOCYTESUR NEGATIVE 02/01/2019  4818   Sepsis Labs: @LABRCNTIP (procalcitonin:4,lacticacidven:4)  ) Recent Results (from the past 240 hour(s))  SARS CORONAVIRUS 2 (TAT 6-24 HRS) Nasopharyngeal Nasopharyngeal Swab     Status: None   Collection Time: 01/27/19 12:19 PM   Specimen: Nasopharyngeal Swab  Result Value Ref Range Status   SARS Coronavirus 2 NEGATIVE NEGATIVE Final    Comment: (NOTE) SARS-CoV-2 target nucleic acids are NOT DETECTED. The SARS-CoV-2 RNA is generally detectable in upper and lower respiratory specimens during the acute phase of infection. Negative results do not preclude SARS-CoV-2 infection, do not rule out co-infections with other pathogens, and should not be used as the sole basis for treatment or other patient management decisions. Negative results must be combined with clinical observations, patient history, and epidemiological information. The expected result is Negative. Fact Sheet for Patients: 13/07/20 Fact Sheet for Healthcare Providers: HairSlick.no This test is not yet  approved or cleared by the [place].com FDA and  has been authorized for detection and/or diagnosis of SARS-CoV-2 by FDA under an Emergency Use Authorization (EUA). This EUA will remain  in effect (meaning this test can be used) for the duration of the COVID-19 declaration under Section 56 4(b)(1) of the Act, 21 U.S.C. section 360bbb-3(b)(1), unless the authorization is terminated or revoked sooner. Performed at [facility], [address] [place], [place]          Radiology Studies: [id number] Renal  Result Date: 01/31/2019 CLINICAL DATA:  Elevated creatinine. EXAM: RENAL / URINARY TRACT ULTRASOUND COMPLETE COMPARISON:  None. FINDINGS: Right Kidney: Renal measurements: 10.6 x 7.0 x 6.9 cm = volume: 264 mL . Echogenicity within normal limits. No mass or hydronephrosis visualized. Left Kidney: Renal measurements: 12.0 x 6.95.8 cm = volume: 251 mL. Echogenicity within normal limits. No mass or hydronephrosis visualized. Bladder: Appears normal for degree of bladder distention. Other: None. IMPRESSION: No acute process or explanation for elevated creatinine. Electronically Signed   By: 13/01/2019 M.D.   On: 01/31/2019 19:28        Scheduled Meds:  benztropine  1 mg Oral Daily   Or   benztropine mesylate  1 mg Intramuscular Daily   docusate sodium  100 mg Oral BID   enoxaparin (LOVENOX) injection  30 mg Subcutaneous Q24H   haloperidol  5 mg Oral Daily   Or   haloperidol lactate  5 mg Intramuscular Daily   LORazepam  2 mg Oral Daily   Or   LORazepam  2 mg Intramuscular Daily   Continuous Infusions:  sodium chloride 100 mL/hr at 01/31/19 2246   cefTRIAXone (ROCEPHIN)  IV 1 g (02/01/19 0911)     LOS: 2 days    Time spent: 25 minutes    13/12/20, MD [facility] 02/01/2019, 10:13 AM     Please page through AMION:  [email or web address] Password [email or web address] If 7PM-7AM, please contact night-coverage

## 2019-02-02 ENCOUNTER — Encounter (HOSPITAL_COMMUNITY): Payer: Self-pay

## 2019-02-02 ENCOUNTER — Encounter (HOSPITAL_COMMUNITY)
Admission: EM | Disposition: A | Discharge status: Home / Self Care | Payer: Self-pay | Source: Home / Self Care | Attending: Internal Medicine

## 2019-02-02 LAB — CBC
HCT: 34 % — ABNORMAL LOW (ref 39.0–52.0)
Hemoglobin: 11.1 g/dL — ABNORMAL LOW (ref 13.0–17.0)
MCH: 29.6 pg (ref 26.0–34.0)
MCHC: 32.6 g/dL (ref 30.0–36.0)
MCV: 90.7 fL (ref 80.0–100.0)
Platelets: 184 10*3/uL (ref 150–400)
RBC: 3.75 MIL/uL — ABNORMAL LOW (ref 4.22–5.81)
RDW: 12.8 % (ref 11.5–15.5)
WBC: 7.4 10*3/uL (ref 4.0–10.5)
nRBC: 0 % (ref 0.0–0.2)

## 2019-02-02 LAB — BASIC METABOLIC PANEL
Anion gap: 12 (ref 5–15)
BUN: 40 mg/dL — ABNORMAL HIGH (ref 6–20)
CO2: 17 mmol/L — ABNORMAL LOW (ref 22–32)
Calcium: 7.6 mg/dL — ABNORMAL LOW (ref 8.9–10.3)
Chloride: 110 mmol/L (ref 98–111)
Creatinine, Ser: 8.05 mg/dL — ABNORMAL HIGH (ref 0.61–1.24)
GFR calc Af Amer: 9 mL/min — ABNORMAL LOW (ref >60)
GFR calc non Af Amer: 8 mL/min — ABNORMAL LOW (ref >60)
Glucose, Bld: 90 mg/dL (ref 70–99)
Potassium: 3.5 mmol/L (ref 3.5–5.1)
Sodium: 139 mmol/L (ref 135–145)

## 2019-02-02 SURGERY — IRRIGATION AND DEBRIDEMENT WOUND
Anesthesia: General | Site: Wrist | Laterality: Left

## 2019-02-02 MED ORDER — MUPIROCIN 2 % EX OINT
1.0000 "application " | TOPICAL_OINTMENT | Freq: Two times a day (BID) | CUTANEOUS | Status: AC
  Administered 2019-02-02 – 2019-02-06 (×7): 1 via NASAL
  Filled 2019-02-02 (×3): qty 22

## 2019-02-02 NOTE — Progress Notes (Signed)
PROGRESS NOTE    Tyrone Jenkins  ZOX:[id number] DOB: 1981/01/22 DOA: 01/27/2019 PCP: Patient, No Pcp Per      Brief Narrative:  Tyrone Jenkins is a 38 y.o. M with schizophrenia who presented with being found wandering.  From H&P: "Patient was found walking around on the road on 11/7 and was noted to have abrasions on his R wrist and under chin.  He was also found to have a large open wound to his left wrist with uneven edges, partial necrosis, and mild surrounding erythema.  Wet to dry dressings were applied.  With further evaluation, the patient was noted to have h/o schizophrenia and has spells where he doesn't talk.  Over the next few days, the thought was the surrounding erythema from his wounds may have been related to wrist restraints.  IVC paperwork was completed and he was planned to go to [facility].  However, prior to the transfer this evening, the nurse was concerned about his wounds and CT appeared to confirm cellulitis.  Hand surgery was consulted."  Patient subsequently developed severe renal failure.        Assessment & Plan:    Acute renal failure Creatinine baseline appears to be around 1.1-1.4.   Between 11/9 and 11/10, it jumped from 1.5 to 4.5 and has been climbing.   Today, worse to >8 mg/dL.  UOP still only 500 per day.  Bicarb slightly down.  No hypoxia, dyspnea. No confusion. UA shows bland cases, no cells.  Renal US showed no hydronephrosis. FeNa <1% -Continue IV fluids -Strict I/Os -Consult Nephrology, appreciate cares -Transfer to [facility] for [facility] HD    Wrist cellulitis Was given Keflex in the ER, then started on Vanco, Zosyn, and clindamycin at the time of admission.  11/11 his creatinine was checked and he had renal failure and so antibiotics were narrowed.    -Continue ceftriaxone -Consult plastic surgery, appreciate expert attentions -Discussed with Dr. Arita Miss; from surgical standpoint, debridement is less urgent than addressing renal failure, and  could be completed at a later day   Schizophrenia Under IVC No delusions, psychosis apparent to me today He has had intermittent mutism per nursing -Continue Ativan, Cogentin, Haldol -To [facility] after discharge            MDM and disposition: The below labs and imaging reports reviewed and summarized above.  Medication management as above. He has worsening renal failure.  The patient was admitted with wrist cellulitis.  He subsequently developed renal failure.     He will need continued ongoing medical management and work-up of his renal failure and posssible initiating dialysis.  For now, this will postpone considerations of surgical debridmeent of his wrists and behavioral health.      DVT prophylaxis: Lovenox Code Status: Full code Family Communication: Mother by phone, could not leave VM, mailbox full    Consultants:   Hand surgery  Nephrology  TTS  Procedures:   11/9 CT forearm - cellultiis of wrist  11/11 US renal - unremarkable  Antimicrobials:   Vancomycin 11/9-11/10  Flagyl x1 11/11  Clindamycin x1 11/9  Zosyn x111/9  Ceftriaxone IM x1 11/9, restarted 11/12 >>   Subjective: No swelling, dyspnea, orthopnea, chest pain, confusion, itching.  Redness on his face is better.  No fever.        Objective: Vitals:   02/01/19 0529 02/01/19 1502 02/01/19 2022 02/02/19 0618  BP: 121/89 (!) 143/98 (!) 143/93 (!) 161/91  Pulse: (!) 59 (!) 43 (!) 49 (!) 54  Resp: 16 18 16 16   Temp: (!) 97.2 F (36.2 C) 99.2 F (37.3 C) 98.9 F (37.2 C) 99.1 F (37.3 C)  TempSrc: Oral Oral Oral Oral  SpO2: 96% 96% 93% 94%  Weight:      Height:        Intake/Output Summary (Last 24 hours) at 02/02/2019 0814 Last data filed at 02/02/2019 0710 Gross per 24 hour  Intake 460 ml  Output 525 ml  Net -65 ml   Filed Weights   01/27/19 0846 01/30/19 0242  Weight: 90.7 kg 90.7 kg    Examination: General appearance: Well-nourished adult male, alert and in no  acute distress.   HEENT: Anicteric, no subconjunctival hemorrhage on the right, lids and lashes normal. No nasal deformity, discharge, epistaxis.  Lips moist, teeth normal. OP moist, no oral lesions.  Normal. Skin: Warm and dry.  Wounds wrapped, not inspected today, no redness spreading from the wrist Cardiac: RRR, no murmurs appreciated.  JVP not visible no LE edema.    Respiratory: Normal respiratory rate and rhythm.  CTAB without rales or wheezes. Abdomen: Abdomen soft.  No ascites, distension, hepatosplenomegaly.   MSK: No deformities or effusions of the large joints of the upper or lower extremities bilaterally. Neuro: Awake and alert. Naming is grossly intact, and the patient's recall, recent and remote, as well as general fund of knowledge seem within normal limits.  Muscle tone normal, without fasciculations.  Moves all extremities equally and with normal coordination.  13/10/20 Speech fluent.    Psych: Sensorium intact and responding to questions, attention normal. Affect blunted.  Judgment and insight appear normal.  Not responding to internal stimuli.  Speech normal rate and rhythm, no obvious paranoia or hallucinations in his thought content.    Data Reviewed: I have personally reviewed following labs and imaging studies:  CBC: Recent Labs  Lab 01/27/19 0919 01/29/19 1501 01/30/19 0515 01/31/19 0547 02/02/19 0635  WBC 14.5* 10.4 10.0 9.6 7.4  NEUTROABS 11.4* 6.3  --   --   --   HGB 14.9 14.3 13.6 12.3* 11.1*  HCT 45.3 42.9 41.2 36.8* 34.0*  MCV 88.8 89.9 90.7 90.6 90.7  PLT 309 273 296 218 184   Basic Metabolic Panel: Recent Labs  Lab 01/31/19 0547 01/31/19 0804 01/31/19 1827 02/01/19 0533 02/02/19 0635  NA 138 138 140 137 139  K 3.0* 3.3* 3.2* 3.5 3.5  CL 107 108 108 107 110  CO2 22 21* 22 19* 17*  GLUCOSE 125* 118* 128* 105* 90  BUN 34* 33* 36* 36* 40*  CREATININE 4.57* 4.98* 5.71* 6.80* 8.05*  CALCIUM 7.6* 7.5* 7.7* 7.6* 7.6*   GFR: Estimated Creatinine  Clearance: 13.5 mL/min (A) (by C-G formula based on SCr of 8.05 mg/dL (H)). Liver Function Tests: Recent Labs  Lab 01/27/19 0919 01/29/19 1501  AST 34 29  ALT 25 23  ALKPHOS 57 56  BILITOT 3.6* 2.2*  PROT 7.7 7.0  ALBUMIN 5.0 4.3   No results for input(s): LIPASE, AMYLASE in the last 168 hours. No results for input(s): AMMONIA in the last 168 hours. Coagulation Profile: No results for input(s): INR, PROTIME in the last 168 hours. Cardiac Enzymes: No results for input(s): CKTOTAL, CKMB, CKMBINDEX, TROPONINI in the last 168 hours. BNP (last 3 results) No results for input(s): PROBNP in the last 8760 hours. HbA1C: No results for input(s): HGBA1C in the last 72 hours. CBG: No results for input(s): GLUCAP in the last 168 hours. Lipid Profile: No results for input(s):  CHOL, HDL, LDLCALC, TRIG, CHOLHDL, LDLDIRECT in the last 72 hours. Thyroid Function Tests: No results for input(s): TSH, T4TOTAL, FREET4, T3FREE, THYROIDAB in the last 72 hours. Anemia Panel: No results for input(s): VITAMINB12, FOLATE, FERRITIN, TIBC, IRON, RETICCTPCT in the last 72 hours. Urine analysis:    Component Value Date/Time   COLORURINE YELLOW 02/01/2019 0618   APPEARANCEUR CLEAR 02/01/2019 0618   LABSPEC 1.008 02/01/2019 0618   PHURINE 6.0 02/01/2019 0618   GLUCOSEU NEGATIVE 02/01/2019 0618   HGBUR NEGATIVE 02/01/2019 0618   BILIRUBINUR NEGATIVE 02/01/2019 0618   KETONESUR NEGATIVE 02/01/2019 0618   PROTEINUR 100 (A) 02/01/2019 0618   NITRITE NEGATIVE 02/01/2019 0618   LEUKOCYTESUR NEGATIVE 02/01/2019 0618   Sepsis Labs: @LABRCNTIP (procalcitonin:4,lacticacidven:4)  ) Recent Results (from the past 240 hour(s))  SARS CORONAVIRUS 2 (TAT 6-24 HRS) Nasopharyngeal Nasopharyngeal Swab     Status: None   Collection Time: 01/27/19 12:19 PM   Specimen: Nasopharyngeal Swab  Result Value Ref Range Status   SARS Coronavirus 2 NEGATIVE NEGATIVE Final    Comment: (NOTE) SARS-CoV-2 target nucleic acids are  NOT DETECTED. The SARS-CoV-2 RNA is generally detectable in upper and lower respiratory specimens during the acute phase of infection. Negative results do not preclude SARS-CoV-2 infection, do not rule out co-infections with other pathogens, and should not be used as the sole basis for treatment or other patient management decisions. Negative results must be combined with clinical observations, patient history, and epidemiological information. The expected result is Negative. Fact Sheet for Patients: HairSlick.nohttps://www.fda.gov/media/138098/download Fact Sheet for Healthcare Providers: quierodirigir.comhttps://www.fda.gov/media/138095/download This test is not yet approved or cleared by the [place] FDA and  has been authorized for detection and/or diagnosis of SARS-CoV-2 by FDA under an Emergency Use Authorization (EUA). This EUA will remain  in effect (meaning this test can be used) for the duration of the COVID-19 declaration under Section 56 4(b)(1) of the Act, 21 U.S.C. section 360bbb-3(b)(1), unless the authorization is terminated or revoked sooner. Performed at [facility], [address] [place], [place] [address]          Radiology Studies: [place] Renal  Result Date: 01/31/2019 CLINICAL DATA:  Elevated creatinine. EXAM: RENAL / URINARY TRACT ULTRASOUND COMPLETE COMPARISON:  None. FINDINGS: Right Kidney: Renal measurements: 10.6 x 7.0 x 6.9 cm = volume: 264 mL . Echogenicity within normal limits. No mass or hydronephrosis visualized. Left Kidney: Renal measurements: 12.0 x 6.95.8 cm = volume: 251 mL. Echogenicity within normal limits. No mass or hydronephrosis visualized. Bladder: Appears normal for degree of bladder distention. Other: None. IMPRESSION: No acute process or explanation for elevated creatinine. Electronically Signed   By: Jeronimo GreavesKyle  Talbot M.D.   On: 01/31/2019 19:28        Scheduled Meds: . benztropine  1 mg Oral Daily   Or  . benztropine mesylate  1 mg Intramuscular  Daily  . docusate sodium  100 mg Oral BID  . enoxaparin (LOVENOX) injection  30 mg Subcutaneous Q24H  . haloperidol  5 mg Oral Daily   Or  . haloperidol lactate  5 mg Intramuscular Daily  . LORazepam  2 mg Oral Daily   Or  . LORazepam  2 mg Intramuscular Daily  . mupirocin ointment  1 application Nasal BID   Continuous Infusions: . sodium chloride 75 mL/hr at 02/01/19 1722  . cefTRIAXone (ROCEPHIN)  IV 1 g (02/01/19 0911)     LOS: 3 days    Time spent: 35 minutes    [employer] CorporationChristopher P Danford,  MD [facility] 02/02/2019, 8:14 AM     Please page through AMION:  [email or web address] Password [email or web address] If 7PM-7AM, please contact night-coverage

## 2019-02-02 NOTE — Progress Notes (Signed)
[facility] here for transport, mother at bedside

## 2019-02-02 NOTE — Progress Notes (Signed)
Called [place] [facility] and gave report to [facility].

## 2019-02-02 NOTE — Progress Notes (Signed)
[facility] Progress Note  Subjective: doing well, no appetite issues or n/v, creat up to 8  Vitals:   02/02/19 0618 02/02/19 1325 02/02/19 1600 02/02/19 1615  BP: (!) 161/91   139/85  Pulse: (!) 54 60  (!) 57  Resp: 16 (!) 26 20 18   Temp: 99.1 F (37.3 C)   98.7 F (37.1 C)  TempSrc: Oral   Oral  SpO2: 94%   96%  Weight:      Height:        Inpatient medications: . benztropine  1 mg Oral Daily   Or  . benztropine mesylate  1 mg Intramuscular Daily  . docusate sodium  100 mg Oral BID  . enoxaparin (LOVENOX) injection  30 mg Subcutaneous Q24H  . haloperidol  5 mg Oral Daily   Or  . haloperidol lactate  5 mg Intramuscular Daily  . LORazepam  2 mg Oral Daily   Or  . LORazepam  2 mg Intramuscular Daily  . mupirocin ointment  1 application Nasal BID   . sodium chloride 125 mL/hr at 02/02/19 0905  . cefTRIAXone (ROCEPHIN)  IV 1 g (02/02/19 0939)   acetaminophen **OR** acetaminophen, ondansetron **OR** ondansetron (ZOFRAN) IV    Exam: Gen pleasant, no distress No rash, cyanosis or gangrene Sclera anicteric, throat clear  No jvd or bruits Chest clear bilat to bases  RRR no MRG  Abd soft ntnd no mass or ascites +bs GU normal male MS no joint effusions or deformity Ext no LE or UE edema, no wounds or ulcers Neuro is alert, Ox 3 , nf    Home meds:  - none   Renal US - negative UA 100 prot, 0-5 wbc/ rbc  Assessment/ Plan: 1. AKI - severe, rapid rise creat. AKI likely vanc nephrotoxicity. Levels were high. Creat rising to 8 today, looks good , no uremic symptoms. UOP 500 cc yest. Will cont IVF"s, tx to [facility] in case HD needed soon.  2. Schizophrenia 3. Bilat wrist wounds/ cellulitis - on flagyl and rocephin now     [employer] 02/02/2019, 4:51 PM  Iron/TIBC/Ferritin/ %Sat No results found for: IRON, TIBC, FERRITIN, IRONPCTSAT Recent Labs  Lab 01/29/19 1501  02/02/19 0635  NA 141   < > 139  K 3.1*   < > 3.5  CL 105   < > 110  CO2  27   < > 17*  GLUCOSE 102*   < > 90  BUN 23*   < > 40*  CREATININE 1.11   < > 8.05*  CALCIUM 8.8*   < > 7.6*  ALBUMIN 4.3  --   --    < > = values in this interval not displayed.   Recent Labs  Lab 01/29/19 1501  AST 29  ALT 23  ALKPHOS 56  BILITOT 2.2*  PROT 7.0   Recent Labs  Lab 02/02/19 0635  WBC 7.4  HGB 11.1*  HCT 34.0*  PLT 184

## 2019-02-03 DIAGNOSIS — F209 Schizophrenia, unspecified: Secondary | ICD-10-CM

## 2019-02-03 LAB — BASIC METABOLIC PANEL
Anion gap: 10 (ref 5–15)
BUN: 38 mg/dL — ABNORMAL HIGH (ref 6–20)
CO2: 17 mmol/L — ABNORMAL LOW (ref 22–32)
Calcium: 7.5 mg/dL — ABNORMAL LOW (ref 8.9–10.3)
Chloride: 111 mmol/L (ref 98–111)
Creatinine, Ser: 8.88 mg/dL — ABNORMAL HIGH (ref 0.61–1.24)
GFR calc Af Amer: 8 mL/min — ABNORMAL LOW (ref >60)
GFR calc non Af Amer: 7 mL/min — ABNORMAL LOW (ref >60)
Glucose, Bld: 98 mg/dL (ref 70–99)
Potassium: 3.6 mmol/L (ref 3.5–5.1)
Sodium: 138 mmol/L (ref 135–145)

## 2019-02-03 MED ORDER — LORAZEPAM 2 MG/ML IJ SOLN
1.0000 mg | Freq: Once | INTRAMUSCULAR | Status: AC
  Administered 2019-02-03: 1 mg via INTRAVENOUS
  Filled 2019-02-03: qty 1

## 2019-02-03 NOTE — Plan of Care (Signed)
?  Problem: Clinical Measurements: ?Goal: Ability to maintain clinical measurements within normal limits will improve ?Outcome: Not Progressing ?  ?

## 2019-02-03 NOTE — Progress Notes (Signed)
[facility] Progress Note  Subjective: BP's good, pt w/o any nausea or vomiting, no confusion, UOP 325 cc recorded.  Creat 8.0 yest > 8.88 today, BUN 38.    Vitals:   02/02/19 1615 02/02/19 2111 02/03/19 0338 02/03/19 0807  BP: 139/85 (!) 154/87 (!) 144/91 (!) 141/74  Pulse: (!) 57 (!) 52 64 63  Resp: 18 18 20 18   Temp: 98.7 F (37.1 C) 98.7 F (37.1 C) 99.3 F (37.4 C) 98.9 F (37.2 C)  TempSrc: Oral Oral Oral Oral  SpO2: 96% 98% 94% 90%  Weight:      Height:        Inpatient medications: . benztropine  1 mg Oral Daily   Or  . benztropine mesylate  1 mg Intramuscular Daily  . docusate sodium  100 mg Oral BID  . enoxaparin (LOVENOX) injection  30 mg Subcutaneous Q24H  . haloperidol  5 mg Oral Daily   Or  . haloperidol lactate  5 mg Intramuscular Daily  . LORazepam  2 mg Oral Daily   Or  . LORazepam  2 mg Intramuscular Daily  . mupirocin ointment  1 application Nasal BID   . sodium chloride 100 mL/hr at 02/03/19 0230  . cefTRIAXone (ROCEPHIN)  IV 1 g (02/03/19 0911)   acetaminophen **OR** acetaminophen, ondansetron **OR** ondansetron (ZOFRAN) IV    Exam: Gen pleasant, no distress No rash, cyanosis or gangrene Sclera anicteric, throat clear  No jvd or bruits Chest clear bilat to bases  RRR no MRG  Abd soft ntnd no mass or ascites +bs GU normal male MS no joint effusions or deformity Ext no LE or UE edema, no wounds or ulcers Neuro is alert, Ox 3 , nf    Home meds:  - none   Renal US - negative UA 100 prot, 0-5 wbc/ rbc  Assessment/ Plan: 1. AKI - severe, rapid rise creat. 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 Levels were high. Creat rose slightly today from 8.0 > 8.8, BUN low, no uremic symptoms, no indication for RRT yet. Making urine but not sure recording all. Will cont IVF"s. Hopefully will recover soon. Will follow.  2. Schizophrenia 3. Bilat wrist wounds/ cellulitis - on flagyl and rocephin     Rob Demya Scruggs 02/03/2019, 1:08  PM  Iron/TIBC/Ferritin/ %Sat No results found for: IRON, TIBC, FERRITIN, IRONPCTSAT Recent Labs  Lab 01/29/19 1501  02/03/19 1031  NA 141   < > 138  K 3.1*   < > 3.6  CL 105   < > 111  CO2 27   < > 17*  GLUCOSE 102*   < > 98  BUN 23*   < > 38*  CREATININE 1.11   < > 8.88*  CALCIUM 8.8*   < > 7.5*  ALBUMIN 4.3  --   --    < > = values in this interval not displayed.   Recent Labs  Lab 01/29/19 1501  AST 29  ALT 23  ALKPHOS 56  BILITOT 2.2*  PROT 7.0   Recent Labs  Lab 02/02/19 0635  WBC 7.4  HGB 11.1*  HCT 34.0*  PLT 184

## 2019-02-03 NOTE — Progress Notes (Signed)
Both wrist dressings have been changed. Patient tolerated well.   Farley Ly RN

## 2019-02-03 NOTE — Progress Notes (Signed)
PROGRESS NOTE    Tyrone Jenkins  XLK:[id number] DOB: 1980/10/12 DOA: 01/27/2019 PCP: Patient, No Pcp Per      Brief Narrative:  [name] is a 38 y.o. M with schizophrenia who presented with being found wandering. From H&P: "Patient was found walking around on the road on 11/7 and was noted to have abrasions on his R wrist and under chin.  He was also found to have a large open wound to his left wrist with uneven edges, partial necrosis, and mild surrounding erythema.  Wet to dry dressings were applied.  With further evaluation, the patient was noted to have h/o schizophrenia and has spells where he doesn't talk. Over the next few days, the thought was the surrounding erythema from his wounds may have been related to wrist restraints.  IVC paperwork was completed and he was planned to go [facility]. However, prior to the transfer, CT appeared to confirm cellulitis.  Hand surgery was consulted. Patient subsequently developed severe renal failure.   Assessment & Plan:  Acute renal failure, with oliguria Creatinine baseline appears to be around 1.1-1.4 Between 11/9 and 11/10, it jumped from 1.5 to 4.5 and has been rising, now 8.8 UA shows bland cases, no cells Renal US showed no hydronephrosis. FeNa <1% Nephrology on board, appreciate recs Continue IV fluids Strict I/Os, daily weights  Bilateral wrist cellulitis Currently afebrile, with no leukocytosis S/P Vanco, Zosyn, and clindamycin at the time of admission   Continue ceftriaxone Dr Arita MissPace from plastic surgery was consulted, recommend debridement is less urgent than addressing renal failure, and could be completed at a later day  Schizophrenia Under IVC Had a conversation today with him, knows why he is in the hospital and what is going on. No delusions, psychosis noted today Pt has intermittent mutism per nursing Continue Ativan, Cogentin, Haldol BHH once medically stable       DVT prophylaxis: Lovenox, renally dosed Code  Status: Full code Family Communication: None at bedside    Consultants:   Hand surgery  Nephrology  TTS  Procedures:   11/9 CT forearm - cellultiis of wrist  11/11 US renal - unremarkable  Antimicrobials:   Vancomycin 11/9-11/10  Flagyl x1 11/11  Clindamycin x1 11/9  Zosyn x111/9  Ceftriaxone IM x1 11/9, restarted 11/12 >>   Subjective: Patient seen and examined at bedside.  Oriented, alert, pleasant.  Was conversant.  Reported an episode of nausea and vomiting yesterday.  Denies any further nausea, vomiting, abdominal pain, chest pain, shortness of breath, fever/chills.      Objective: Vitals:   02/02/19 1615 02/02/19 2111 02/03/19 0338 02/03/19 0807  BP: 139/85 (!) 154/87 (!) 144/91 (!) 141/74  Pulse: (!) 57 (!) 52 64 63  Resp: 18 18 20 18   Temp: 98.7 F (37.1 C) 98.7 F (37.1 C) 99.3 F (37.4 C) 98.9 F (37.2 C)  TempSrc: Oral Oral Oral Oral  SpO2: 96% 98% 94% 90%  Weight:      Height:        Intake/Output Summary (Last 24 hours) at 02/03/2019 1257 Last data filed at 02/03/2019 0200 Gross per 24 hour  Intake 1320 ml  Output 150 ml  Net 1170 ml   Filed Weights   01/27/19 0846 01/30/19 0242  Weight: 90.7 kg 90.7 kg    Examination:  General: NAD, pleasant  Cardiovascular: S1, S2 present  Respiratory: CTAB  Abdomen: Soft, nontender, nondistended, bowel sounds present  Musculoskeletal: No bilateral pedal edema noted  Skin:  Wounds around  bilateral wrist wrapped, dressing C/D/I  Psychiatry: Normal mood    Data Reviewed: I have personally reviewed following labs and imaging studies:  CBC: Recent Labs  Lab 01/29/19 1501 01/30/19 0515 01/31/19 0547 02/02/19 0635  WBC 10.4 10.0 9.6 7.4  NEUTROABS 6.3  --   --   --   HGB 14.3 13.6 12.3* 11.1*  HCT 42.9 41.2 36.8* 34.0*  MCV 89.9 90.7 90.6 90.7  PLT 273 296 218 673   Basic Metabolic Panel: Recent Labs  Lab 01/31/19 0804 01/31/19 1827 02/01/19 0533 02/02/19 0635  02/03/19 1031  NA 138 140 137 139 138  K 3.3* 3.2* 3.5 3.5 3.6  CL 108 108 107 110 111  CO2 21* 22 19* 17* 17*  GLUCOSE 118* 128* 105* 90 98  BUN 33* 36* 36* 40* 38*  CREATININE 4.98* 5.71* 6.80* 8.05* 8.88*  CALCIUM 7.5* 7.7* 7.6* 7.6* 7.5*   GFR: Estimated Creatinine Clearance: 12.3 mL/min (A) (by C-G formula based on SCr of 8.88 mg/dL (H)). Liver Function Tests: Recent Labs  Lab 01/29/19 1501  AST 29  ALT 23  ALKPHOS 56  BILITOT 2.2*  PROT 7.0  ALBUMIN 4.3   No results for input(s): LIPASE, AMYLASE in the last 168 hours. No results for input(s): AMMONIA in the last 168 hours. Coagulation Profile: No results for input(s): INR, PROTIME in the last 168 hours. Cardiac Enzymes: No results for input(s): CKTOTAL, CKMB, CKMBINDEX, TROPONINI in the last 168 hours. BNP (last 3 results) No results for input(s): PROBNP in the last 8760 hours. HbA1C: No results for input(s): HGBA1C in the last 72 hours. CBG: No results for input(s): GLUCAP in the last 168 hours. Lipid Profile: No results for input(s): CHOL, HDL, LDLCALC, TRIG, CHOLHDL, LDLDIRECT in the last 72 hours. Thyroid Function Tests: No results for input(s): TSH, T4TOTAL, FREET4, T3FREE, THYROIDAB in the last 72 hours. Anemia Panel: No results for input(s): VITAMINB12, FOLATE, FERRITIN, TIBC, IRON, RETICCTPCT in the last 72 hours. Urine analysis:    Component Value Date/Time   COLORURINE YELLOW 02/01/2019 0618   APPEARANCEUR CLEAR 02/01/2019 0618   LABSPEC 1.008 02/01/2019 0618   PHURINE 6.0 02/01/2019 0618   GLUCOSEU NEGATIVE 02/01/2019 0618   HGBUR NEGATIVE 02/01/2019 0618   BILIRUBINUR NEGATIVE 02/01/2019 0618   KETONESUR NEGATIVE 02/01/2019 0618   PROTEINUR 100 (A) 02/01/2019 0618   NITRITE NEGATIVE 02/01/2019 0618   LEUKOCYTESUR NEGATIVE 02/01/2019 0618   Sepsis Labs: @LABRCNTIP (procalcitonin:4,lacticacidven:4)  ) Recent Results (from the past 240 hour(s))  SARS CORONAVIRUS 2 (TAT 6-24 HRS)  Nasopharyngeal Nasopharyngeal Swab     Status: None   Collection Time: 01/27/19 12:19 PM   Specimen: Nasopharyngeal Swab  Result Value Ref Range Status   SARS Coronavirus 2 NEGATIVE NEGATIVE Final    Comment: (NOTE) SARS-CoV-2 target nucleic acids are NOT DETECTED. The SARS-CoV-2 RNA is generally detectable in upper and lower respiratory specimens during the acute phase of infection. Negative results do not preclude SARS-CoV-2 infection, do not rule out co-infections with other pathogens, and should not be used as the sole basis for treatment or other patient management decisions. Negative results must be combined with clinical observations, patient history, and epidemiological information. The expected result is Negative. Fact Sheet for Patients: SugarRoll.be Fact Sheet for Healthcare Providers: https://www.woods-mathews.com/ This test is not yet approved or cleared by the [place] FDA and  has been authorized for detection and/or diagnosis of SARS-CoV-2 by FDA under an Emergency Use Authorization (EUA). This EUA will remain  in effect (  meaning this test can be used) for the duration of the COVID-19 declaration under Section 56 4(b)(1) of the Act, 21 U.S.C. section 360bbb-3(b)(1), unless the authorization is terminated or revoked sooner. Performed at [facility], [address] [place], [place] [address]          Radiology Studies: No results found.      Scheduled Meds: . benztropine  1 mg Oral Daily   Or  . benztropine mesylate  1 mg Intramuscular Daily  . docusate sodium  100 mg Oral BID  . enoxaparin (LOVENOX) injection  30 mg Subcutaneous Q24H  . haloperidol  5 mg Oral Daily   Or  . haloperidol lactate  5 mg Intramuscular Daily  . LORazepam  2 mg Oral Daily   Or  . LORazepam  2 mg Intramuscular Daily  . mupirocin ointment  1 application Nasal BID   Continuous Infusions: . sodium chloride 100 mL/hr at  02/03/19 0230  . cefTRIAXone (ROCEPHIN)  IV 1 g (02/03/19 0911)     LOS: 4 days        Briant Cedar, MD [facility] 02/03/2019, 12:57 PM

## 2019-02-04 LAB — CBC WITH DIFFERENTIAL/PLATELET
Abs Immature Granulocytes: 0.04 10*3/uL (ref 0.00–0.07)
Basophils Absolute: 0.1 10*3/uL (ref 0.0–0.1)
Basophils Relative: 1 %
Eosinophils Absolute: 0.2 10*3/uL (ref 0.0–0.5)
Eosinophils Relative: 2 %
HCT: 33.7 % — ABNORMAL LOW (ref 39.0–52.0)
Hemoglobin: 11.2 g/dL — ABNORMAL LOW (ref 13.0–17.0)
Immature Granulocytes: 1 %
Lymphocytes Relative: 18 %
Lymphs Abs: 1.4 10*3/uL (ref 0.7–4.0)
MCH: 29.4 pg (ref 26.0–34.0)
MCHC: 33.2 g/dL (ref 30.0–36.0)
MCV: 88.5 fL (ref 80.0–100.0)
Monocytes Absolute: 1 10*3/uL (ref 0.1–1.0)
Monocytes Relative: 14 %
Neutro Abs: 5 10*3/uL (ref 1.7–7.7)
Neutrophils Relative %: 64 %
Platelets: 259 10*3/uL (ref 150–400)
RBC: 3.81 MIL/uL — ABNORMAL LOW (ref 4.22–5.81)
RDW: 12.6 % (ref 11.5–15.5)
WBC: 7.6 10*3/uL (ref 4.0–10.5)
nRBC: 0 % (ref 0.0–0.2)

## 2019-02-04 LAB — BASIC METABOLIC PANEL
Anion gap: 13 (ref 5–15)
BUN: 36 mg/dL — ABNORMAL HIGH (ref 6–20)
CO2: 18 mmol/L — ABNORMAL LOW (ref 22–32)
Calcium: 7.9 mg/dL — ABNORMAL LOW (ref 8.9–10.3)
Chloride: 108 mmol/L (ref 98–111)
Creatinine, Ser: 9.14 mg/dL — ABNORMAL HIGH (ref 0.61–1.24)
GFR calc Af Amer: 8 mL/min — ABNORMAL LOW (ref >60)
GFR calc non Af Amer: 7 mL/min — ABNORMAL LOW (ref >60)
Glucose, Bld: 86 mg/dL (ref 70–99)
Potassium: 3.4 mmol/L — ABNORMAL LOW (ref 3.5–5.1)
Sodium: 139 mmol/L (ref 135–145)

## 2019-02-04 MED ORDER — SODIUM BICARBONATE-DEXTROSE 150-5 MEQ/L-% IV SOLN
150.0000 meq | INTRAVENOUS | Status: DC
  Administered 2019-02-04 – 2019-02-06 (×3): 150 meq via INTRAVENOUS
  Filled 2019-02-04 (×3): qty 1000

## 2019-02-04 NOTE — Progress Notes (Signed)
[facility] Progress Note  Subjective: creat continues to rise, 9.1 today. Pt feels restless, nauseous , not eating, no confusion. Not collecting all his urine. Weighed standing at 109kg.   Vitals:   02/03/19 2110 02/04/19 0437 02/04/19 0447 02/04/19 1000  BP: (!) 142/85 129/72    Pulse: (!) 50 (!) 54    Resp: 20 20    Temp: 98.7 F (37.1 C) 98.8 F (37.1 C)    TempSrc: Oral Oral    SpO2: 93% 91%    Weight:  109.3 kg 108.4 kg 109 kg  Height:        Inpatient medications: . benztropine  1 mg Oral Daily   Or  . benztropine mesylate  1 mg Intramuscular Daily  . docusate sodium  100 mg Oral BID  . enoxaparin (LOVENOX) injection  30 mg Subcutaneous Q24H  . haloperidol  5 mg Oral Daily   Or  . haloperidol lactate  5 mg Intramuscular Daily  . LORazepam  2 mg Oral Daily   Or  . LORazepam  2 mg Intramuscular Daily  . mupirocin ointment  1 application Nasal BID   . cefTRIAXone (ROCEPHIN)  IV 1 g (02/04/19 5400)   acetaminophen **OR** acetaminophen, ondansetron **OR** ondansetron (ZOFRAN) IV    Exam: Gen pleasant, looks tired, no distress, calm and Ox3 No jvd or bruits Chest clear bilat to bases  RRR no MRG  Abd soft ntnd no mass or ascites +bs Ext no LE edema Neuro is alert, Ox 3 , nf    Home meds:  - none   Renal US - negative UA 100 prot, 0-5 wbc/ rbc  Assessment/ Plan: 1. AKI - severe, rapid rise creat. 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 Creat rising, pt has some uremic symptoms but not severe. Hopefully will recover soon, may need HD tomorrow though if numbers are worse again. Will lower IVF's, wt's stable. Vol ok on exam. Will follow.  2. Schizophrenia 3. Bilat wrist wounds/ cellulitis - on flagyl and rocephin     Rob Thao Bauza 02/04/2019, 11:40 AM  Iron/TIBC/Ferritin/ %Sat No results found for: IRON, TIBC, FERRITIN, IRONPCTSAT Recent Labs  Lab 01/29/19 1501  02/04/19 0456  NA 141   < > 139  K 3.1*   < > 3.4*  CL 105   < > 108  CO2  27   < > 18*  GLUCOSE 102*   < > 86  BUN 23*   < > 36*  CREATININE 1.11   < > 9.14*  CALCIUM 8.8*   < > 7.9*  ALBUMIN 4.3  --   --    < > = values in this interval not displayed.   Recent Labs  Lab 01/29/19 1501  AST 29  ALT 23  ALKPHOS 56  BILITOT 2.2*  PROT 7.0   Recent Labs  Lab 02/04/19 0456  WBC 7.6  HGB 11.2*  HCT 33.7*  PLT 259

## 2019-02-04 NOTE — Plan of Care (Signed)
  Problem: Education: Goal: Knowledge of General Education information will improve Description Including pain rating scale, medication(s)/side effects and non-pharmacologic comfort measures Outcome: Progressing   

## 2019-02-04 NOTE — Progress Notes (Signed)
PROGRESS NOTE    Tyrone Jenkins  [id number]:[id number] DOB: [birth year] DOA: 01/27/2019 PCP: Patient, No Pcp Per      Brief Narrative:  Tyrone Jenkins is a 38 y.o. M with schizophrenia who presented with being found wandering. From H&P: "Patient was found walking around on the road on 11/7 and was noted to have abrasions on his R wrist and under chin.  He was also found to have a large open wound to his left wrist with uneven edges, partial necrosis, and mild surrounding erythema.  Wet to dry dressings were applied.  With further evaluation, the patient was noted to have h/o schizophrenia and has spells where he doesn't talk. Over the next few days, the thought was the surrounding erythema from his wounds may have been related to wrist restraints.  IVC paperwork was completed and he was planned to go to [facility] However, prior to the transfer, CT appeared to confirm cellulitis.  Hand surgery was consulted. Patient subsequently developed severe renal failure.   Assessment & Plan:  Acute renal failure, with oliguria Worsening Creatinine baseline appears to be around 1.1-1.4 Between 11/9 and 11/10, it jumped from 1.5 to 4.5 and has been rising, now 9.14 UA shows bland cases, no cells Renal US showed no hydronephrosis. FeNa <1% Nephrology on board, may need dialysis if creatinine continues to worsen in addition to worsening uremic symptoms Nephrology switch to bicarb drip at 65 cc/h Strict I/Os, daily weights  Bilateral wrist cellulitis Currently afebrile, with no leukocytosis S/P Vanco, Zosyn, and clindamycin at the time of admission   Continue ceftriaxone Dr Arita Miss from plastic surgery was consulted, recommend debridement is less urgent than addressing renal failure, and could be completed at a later day  Schizophrenia Under IVC Had a conversation today with him, knows why he is in the hospital and what is going on. No delusions, psychosis noted today Pt has intermittent mutism per nursing  Continue Ativan, Cogentin, Haldol BHH once medically stable       DVT prophylaxis: Lovenox, renally dosed Code Status: Full code Family Communication: Mother at bedside    Consultants:   Hand surgery  Nephrology  TTS  Procedures:   11/9 CT forearm - cellultiis of wrist  11/11 US renal - unremarkable  Antimicrobials:   Vancomycin 11/9-11/10  Flagyl x1 11/11  Clindamycin x1 11/9  Zosyn x111/9  Ceftriaxone IM x1 11/9, restarted 11/12 >>   Subjective: Patient seen and examined at bedside.  Mother present at bedside.  Reports nausea, vomiting, poor oral intake.  Denies any chest pain, shortness of breath.  Oriented, cooperative.      Objective: Vitals:   02/04/19 0437 02/04/19 0447 02/04/19 1000 02/04/19 1453  BP: 129/72   (!) 148/92  Pulse: (!) 54   (!) 57  Resp: 20   18  Temp: 98.8 F (37.1 C)   98.9 F (37.2 C)  TempSrc: Oral   Oral  SpO2: 91%   94%  Weight: 109.3 kg 108.4 kg 109 kg   Height:        Intake/Output Summary (Last 24 hours) at 02/04/2019 1559 Last data filed at 02/04/2019 1500 Gross per 24 hour  Intake 2369.62 ml  Output 850 ml  Net 1519.62 ml   Filed Weights   02/04/19 0437 02/04/19 0447 02/04/19 1000  Weight: 109.3 kg 108.4 kg 109 kg    Examination:  General: NAD, pleasant  Cardiovascular: S1, S2 present  Respiratory: CTAB  Abdomen: Soft, nontender, nondistended, bowel sounds present  Musculoskeletal:  No bilateral pedal edema noted  Skin:  Wounds around bilateral wrist wrapped, dressing C/D/I  Psychiatry: Normal mood    Data Reviewed: I have personally reviewed following labs and imaging studies:  CBC: Recent Labs  Lab 01/29/19 1501 01/30/19 0515 01/31/19 0547 02/02/19 0635 02/04/19 0456  WBC 10.4 10.0 9.6 7.4 7.6  NEUTROABS 6.3  --   --   --  5.0  HGB 14.3 13.6 12.3* 11.1* 11.2*  HCT 42.9 41.2 36.8* 34.0* 33.7*  MCV 89.9 90.7 90.6 90.7 88.5  PLT 273 296 218 184 983   Basic Metabolic Panel:  Recent Labs  Lab 01/31/19 1827 02/01/19 0533 02/02/19 0635 02/03/19 1031 02/04/19 0456  NA 140 137 139 138 139  K 3.2* 3.5 3.5 3.6 3.4*  CL 108 107 110 111 108  CO2 22 19* 17* 17* 18*  GLUCOSE 128* 105* 90 98 86  BUN 36* 36* 40* 38* 36*  CREATININE 5.71* 6.80* 8.05* 8.88* 9.14*  CALCIUM 7.7* 7.6* 7.6* 7.5* 7.9*   GFR: Estimated Creatinine Clearance: 13.8 mL/min (A) (by C-G formula based on SCr of 9.14 mg/dL (H)). Liver Function Tests: Recent Labs  Lab 01/29/19 1501  AST 29  ALT 23  ALKPHOS 56  BILITOT 2.2*  PROT 7.0  ALBUMIN 4.3   No results for input(s): LIPASE, AMYLASE in the last 168 hours. No results for input(s): AMMONIA in the last 168 hours. Coagulation Profile: No results for input(s): INR, PROTIME in the last 168 hours. Cardiac Enzymes: No results for input(s): CKTOTAL, CKMB, CKMBINDEX, TROPONINI in the last 168 hours. BNP (last 3 results) No results for input(s): PROBNP in the last 8760 hours. HbA1C: No results for input(s): HGBA1C in the last 72 hours. CBG: No results for input(s): GLUCAP in the last 168 hours. Lipid Profile: No results for input(s): CHOL, HDL, LDLCALC, TRIG, CHOLHDL, LDLDIRECT in the last 72 hours. Thyroid Function Tests: No results for input(s): TSH, T4TOTAL, FREET4, T3FREE, THYROIDAB in the last 72 hours. Anemia Panel: No results for input(s): VITAMINB12, FOLATE, FERRITIN, TIBC, IRON, RETICCTPCT in the last 72 hours. Urine analysis:    Component Value Date/Time   COLORURINE YELLOW 02/01/2019 0618   APPEARANCEUR CLEAR 02/01/2019 0618   LABSPEC 1.008 02/01/2019 0618   PHURINE 6.0 02/01/2019 0618   GLUCOSEU NEGATIVE 02/01/2019 0618   HGBUR NEGATIVE 02/01/2019 0618   BILIRUBINUR NEGATIVE 02/01/2019 0618   KETONESUR NEGATIVE 02/01/2019 0618   PROTEINUR 100 (A) 02/01/2019 0618   NITRITE NEGATIVE 02/01/2019 0618   LEUKOCYTESUR NEGATIVE 02/01/2019 0618   Sepsis Labs: @LABRCNTIP (procalcitonin:4,lacticacidven:4)  ) Recent Results  (from the past 240 hour(s))  SARS CORONAVIRUS 2 (TAT 6-24 HRS) Nasopharyngeal Nasopharyngeal Swab     Status: None   Collection Time: 01/27/19 12:19 PM   Specimen: Nasopharyngeal Swab  Result Value Ref Range Status   SARS Coronavirus 2 NEGATIVE NEGATIVE Final    Comment: (NOTE) SARS-CoV-2 target nucleic acids are NOT DETECTED. The SARS-CoV-2 RNA is generally detectable in upper and lower respiratory specimens during the acute phase of infection. Negative results do not preclude SARS-CoV-2 infection, do not rule out co-infections with other pathogens, and should not be used as the sole basis for treatment or other patient management decisions. Negative results must be combined with clinical observations, patient history, and epidemiological information. The expected result is Negative. Fact Sheet for Patients: SugarRoll.be Fact Sheet for Healthcare Providers: https://www.woods-mathews.com/ This test is not yet approved or cleared by the [place] FDA and  has been authorized for detection and/or  diagnosis of SARS-CoV-2 by FDA under an Emergency Use Authorization (EUA). This EUA will remain  in effect (meaning this test can be used) for the duration of the COVID-19 declaration under Section 56 4(b)(1) of the Act, 21 U.S.C. section 360bbb-3(b)(1), unless the authorization is terminated or revoked sooner. Performed at [facility], [address] [place], [place] [address]          Radiology Studies: No results found.      Scheduled Meds: . benztropine  1 mg Oral Daily   Or  . benztropine mesylate  1 mg Intramuscular Daily  . docusate sodium  100 mg Oral BID  . enoxaparin (LOVENOX) injection  30 mg Subcutaneous Q24H  . haloperidol  5 mg Oral Daily   Or  . haloperidol lactate  5 mg Intramuscular Daily  . LORazepam  2 mg Oral Daily   Or  . LORazepam  2 mg Intramuscular Daily  . mupirocin ointment  1 application Nasal  BID   Continuous Infusions: . cefTRIAXone (ROCEPHIN)  IV 1 g (02/04/19 0922)  . sodium bicarbonate 150 mEq in dextrose 5% 1000 mL 150 mEq (02/04/19 1232)     LOS: 5 days        Briant CedarNkeiruka J Jatara Huettner, MD [facility] 02/04/2019, 3:59 PM

## 2019-02-05 LAB — BASIC METABOLIC PANEL
Anion gap: 10 (ref 5–15)
BUN: 36 mg/dL — ABNORMAL HIGH (ref 6–20)
CO2: 19 mmol/L — ABNORMAL LOW (ref 22–32)
Calcium: 7.6 mg/dL — ABNORMAL LOW (ref 8.9–10.3)
Chloride: 109 mmol/L (ref 98–111)
Creatinine, Ser: 9.2 mg/dL — ABNORMAL HIGH (ref 0.61–1.24)
GFR calc Af Amer: 7 mL/min — ABNORMAL LOW (ref >60)
GFR calc non Af Amer: 6 mL/min — ABNORMAL LOW (ref >60)
Glucose, Bld: 100 mg/dL — ABNORMAL HIGH (ref 70–99)
Potassium: 3.3 mmol/L — ABNORMAL LOW (ref 3.5–5.1)
Sodium: 138 mmol/L (ref 135–145)

## 2019-02-05 LAB — CBC WITH DIFFERENTIAL/PLATELET
Abs Immature Granulocytes: 0.03 10*3/uL (ref 0.00–0.07)
Basophils Absolute: 0.1 10*3/uL (ref 0.0–0.1)
Basophils Relative: 1 %
Eosinophils Absolute: 0.2 10*3/uL (ref 0.0–0.5)
Eosinophils Relative: 2 %
HCT: 32.5 % — ABNORMAL LOW (ref 39.0–52.0)
Hemoglobin: 11.2 g/dL — ABNORMAL LOW (ref 13.0–17.0)
Immature Granulocytes: 0 %
Lymphocytes Relative: 19 %
Lymphs Abs: 1.5 10*3/uL (ref 0.7–4.0)
MCH: 29.3 pg (ref 26.0–34.0)
MCHC: 34.5 g/dL (ref 30.0–36.0)
MCV: 85.1 fL (ref 80.0–100.0)
Monocytes Absolute: 1.1 10*3/uL — ABNORMAL HIGH (ref 0.1–1.0)
Monocytes Relative: 15 %
Neutro Abs: 4.9 10*3/uL (ref 1.7–7.7)
Neutrophils Relative %: 63 %
Platelets: 265 10*3/uL (ref 150–400)
RBC: 3.82 MIL/uL — ABNORMAL LOW (ref 4.22–5.81)
RDW: 12.5 % (ref 11.5–15.5)
WBC: 7.7 10*3/uL (ref 4.0–10.5)
nRBC: 0 % (ref 0.0–0.2)

## 2019-02-05 MED ORDER — LORAZEPAM 1 MG PO TABS
1.0000 mg | ORAL_TABLET | Freq: Once | ORAL | Status: AC
  Administered 2019-02-05: 1 mg via ORAL
  Filled 2019-02-05: qty 1

## 2019-02-05 NOTE — Progress Notes (Signed)
PROGRESS NOTE    Tyrone Jenkins  WUJ:[id number]:[id number] DOB: 07/03/1980 DOA: 01/27/2019 PCP: Patient, No Pcp Per      Brief Narrative:  [name] is a 38 y.o. M with schizophrenia who presented with being found wandering. From H&P: "Patient was found walking around on the road on 11/7 and was noted to have abrasions on his R wrist and under chin.  He was also found to have a large open wound to his left wrist with uneven edges, partial necrosis, and mild surrounding erythema.  Wet to dry dressings were applied.  With further evaluation, the patient was noted to have h/o schizophrenia and has spells where he doesn't talk. Over the next few days, the thought was the surrounding erythema from his wounds may have been related to wrist restraints.  IVC paperwork was completed and he was planned to go to [facility]. However, prior to the transfer, CT appeared to confirm cellulitis.  Hand surgery was consulted. Patient subsequently developed severe renal failure.   Assessment & Plan:  Acute renal failure, with oliguria Ongoing Creatinine baseline appears to be around 1.1-1.4 Between 11/9 and 11/10, it jumped from 1.5 to 4.5 and has been rising, now 9.2 UA shows bland cases, no cells Renal US showed no hydronephrosis. FeNa <1% Nephrology on board, may need dialysis if creatinine continues to worsen in addition to worsening uremic symptoms Nephrology switch to bicarb drip at 65 cc/h Strict I/Os, daily weights  Bilateral wrist cellulitis Currently afebrile, with no leukocytosis S/P Vanco, Zosyn, and clindamycin at the time of admission   Continue ceftriaxone Dr Arita MissPace from plastic surgery was consulted, recommend debridement is less urgent than addressing renal failure, and could be completed at a later day  Schizophrenia Under IVC, currently expired. Psychiatry to re-evaluate pt to decide if he still needs IVC (personally dont think so as pt is currently compliant with medical management) Also to  reevaluate if patient still needs to [facility] Pt has intermittent mutism per nursing Continue Ativan, Cogentin, Haldol       DVT prophylaxis: Lovenox, renally dosed Code Status: Full code Family Communication: None at bedside    Consultants:   Hand surgery  Nephrology  TTS  Procedures:   11/9 CT forearm - cellultiis of wrist  11/11 US renal - unremarkable  Antimicrobials:   Vancomycin 11/9-11/10  Flagyl x1 11/11  Clindamycin x1 11/9  Zosyn x111/9  Ceftriaxone IM x1 11/9, restarted 11/12 >>   Subjective: Patient seen and examined at bedside.  Reports improved nausea today, denies any vomiting, chest pain, shortness of breath, abdominal pain, diarrhea, fever/chills.  Patient is oriented x3, follows commands, have appropriate conversations, compliance with medical recommendations.      Objective: Vitals:   02/04/19 1728 02/04/19 2211 02/05/19 0700 02/05/19 1005  BP: (!) 149/88 (!) 166/91 (!) 148/96 (!) 145/91  Pulse: (!) 55 (!) 50 61 (!) 53  Resp: 18 18 19 20   Temp: 98.9 F (37.2 C) 98.7 F (37.1 C) 98.4 F (36.9 C) 98.7 F (37.1 C)  TempSrc: Oral Oral Oral Oral  SpO2: 94% 97% 98% 94%  Weight:      Height:        Intake/Output Summary (Last 24 hours) at 02/05/2019 1542 Last data filed at 02/05/2019 1405 Gross per 24 hour  Intake 1876.56 ml  Output 1350 ml  Net 526.56 ml   Filed Weights   02/04/19 0437 02/04/19 0447 02/04/19 1000  Weight: 109.3 kg 108.4 kg 109 kg  Examination:  General: NAD, pleasant  Cardiovascular: S1, S2 present  Respiratory: CTAB  Abdomen: Soft, nontender, nondistended, bowel sounds present  Musculoskeletal: No bilateral pedal edema noted  Skin:  Dressing noted around bilateral wrists, C/D/  Psychiatry: Normal mood    Data Reviewed: I have personally reviewed following labs and imaging studies:  CBC: Recent Labs  Lab 01/30/19 0515 01/31/19 0547 02/02/19 0635 02/04/19 0456 02/05/19 0512  WBC  10.0 9.6 7.4 7.6 7.7  NEUTROABS  --   --   --  5.0 4.9  HGB 13.6 12.3* 11.1* 11.2* 11.2*  HCT 41.2 36.8* 34.0* 33.7* 32.5*  MCV 90.7 90.6 90.7 88.5 85.1  PLT 296 218 184 259 244   Basic Metabolic Panel: Recent Labs  Lab 02/01/19 0533 02/02/19 0635 02/03/19 1031 02/04/19 0456 02/05/19 0512  NA 137 139 138 139 138  K 3.5 3.5 3.6 3.4* 3.3*  CL 107 110 111 108 109  CO2 19* 17* 17* 18* 19*  GLUCOSE 105* 90 98 86 100*  BUN 36* 40* 38* 36* 36*  CREATININE 6.80* 8.05* 8.88* 9.14* 9.20*  CALCIUM 7.6* 7.6* 7.5* 7.9* 7.6*   GFR: Estimated Creatinine Clearance: 13.8 mL/min (A) (by C-G formula based on SCr of 9.2 mg/dL (H)). Liver Function Tests: No results for input(s): AST, ALT, ALKPHOS, BILITOT, PROT, ALBUMIN in the last 168 hours. No results for input(s): LIPASE, AMYLASE in the last 168 hours. No results for input(s): AMMONIA in the last 168 hours. Coagulation Profile: No results for input(s): INR, PROTIME in the last 168 hours. Cardiac Enzymes: No results for input(s): CKTOTAL, CKMB, CKMBINDEX, TROPONINI in the last 168 hours. BNP (last 3 results) No results for input(s): PROBNP in the last 8760 hours. HbA1C: No results for input(s): HGBA1C in the last 72 hours. CBG: No results for input(s): GLUCAP in the last 168 hours. Lipid Profile: No results for input(s): CHOL, HDL, LDLCALC, TRIG, CHOLHDL, LDLDIRECT in the last 72 hours. Thyroid Function Tests: No results for input(s): TSH, T4TOTAL, FREET4, T3FREE, THYROIDAB in the last 72 hours. Anemia Panel: No results for input(s): VITAMINB12, FOLATE, FERRITIN, TIBC, IRON, RETICCTPCT in the last 72 hours. Urine analysis:    Component Value Date/Time   COLORURINE YELLOW 02/01/2019 0618   APPEARANCEUR CLEAR 02/01/2019 0618   LABSPEC 1.008 02/01/2019 0618   PHURINE 6.0 02/01/2019 0618   GLUCOSEU NEGATIVE 02/01/2019 0618   HGBUR NEGATIVE 02/01/2019 0618   BILIRUBINUR NEGATIVE 02/01/2019 0618   KETONESUR NEGATIVE 02/01/2019 0618    PROTEINUR 100 (A) 02/01/2019 0618   NITRITE NEGATIVE 02/01/2019 0618   LEUKOCYTESUR NEGATIVE 02/01/2019 0618   Sepsis Labs: @LABRCNTIP (procalcitonin:4,lacticacidven:4)  ) Recent Results (from the past 240 hour(s))  SARS CORONAVIRUS 2 (TAT 6-24 HRS) Nasopharyngeal Nasopharyngeal Swab     Status: None   Collection Time: 01/27/19 12:19 PM   Specimen: Nasopharyngeal Swab  Result Value Ref Range Status   SARS Coronavirus 2 NEGATIVE NEGATIVE Final    Comment: (NOTE) SARS-CoV-2 target nucleic acids are NOT DETECTED. The SARS-CoV-2 RNA is generally detectable in upper and lower respiratory specimens during the acute phase of infection. Negative results do not preclude SARS-CoV-2 infection, do not rule out co-infections with other pathogens, and should not be used as the sole basis for treatment or other patient management decisions. Negative results must be combined with clinical observations, patient history, and epidemiological information. The expected result is Negative. Fact Sheet for Patients: SugarRoll.be Fact Sheet for Healthcare Providers: https://www.woods-mathews.com/ This test is not yet approved or cleared by the  [place] [employer] and  has been authorized for detection and/or diagnosis of SARS-CoV-2 by FDA under an TEFL teacher (EUA). This EUA will remain  in effect (meaning this test can be used) for the duration of the COVID-19 declaration under Section 56 4(b)(1) of the Act, 21 U.S.C. section 360bbb-3(b)(1), unless the authorization is terminated or revoked sooner. Performed at [facility], [address], [place], [place] [address]          Radiology Studies: No results found.      Scheduled Meds: . benztropine  1 mg Oral Daily   Or  . benztropine mesylate  1 mg Intramuscular Daily  . docusate sodium  100 mg Oral BID  . enoxaparin (LOVENOX) injection  30 mg Subcutaneous Q24H  . haloperidol   5 mg Oral Daily   Or  . haloperidol lactate  5 mg Intramuscular Daily  . LORazepam  2 mg Oral Daily   Or  . LORazepam  2 mg Intramuscular Daily  . mupirocin ointment  1 application Nasal BID   Continuous Infusions: . cefTRIAXone (ROCEPHIN)  IV 1 g (02/05/19 0909)  . sodium bicarbonate 150 mEq in dextrose 5% 1000 mL Stopped (02/05/19 0909)     LOS: 6 days        Briant Cedar, MD [facility] 02/05/2019, 3:42 PM

## 2019-02-05 NOTE — Progress Notes (Addendum)
[facility] Progress Note  Subjective: Looks like Cr is plateauing- 9.14--> 9.20 this AM.  A little nauseated last night but ate breakfast.  No other complaints.  Vitals:   02/04/19 1453 02/04/19 1728 02/04/19 2211 02/05/19 0700  BP: (!) 148/92 (!) 149/88 (!) 166/91 (!) 148/96  Pulse: (!) 57 (!) 55 (!) 50 61  Resp: 18 18 18 19   Temp: 98.9 F (37.2 C) 98.9 F (37.2 C) 98.7 F (37.1 C) 98.4 F (36.9 C)  TempSrc: Oral Oral Oral Oral  SpO2: 94% 94% 97% 98%  Weight:      Height:        Inpatient medications: . benztropine  1 mg Oral Daily   Or  . benztropine mesylate  1 mg Intramuscular Daily  . docusate sodium  100 mg Oral BID  . enoxaparin (LOVENOX) injection  30 mg Subcutaneous Q24H  . haloperidol  5 mg Oral Daily   Or  . haloperidol lactate  5 mg Intramuscular Daily  . LORazepam  2 mg Oral Daily   Or  . LORazepam  2 mg Intramuscular Daily  . mupirocin ointment  1 application Nasal BID   . cefTRIAXone (ROCEPHIN)  IV 1 g (02/04/19 0922)  . sodium bicarbonate 150 mEq in dextrose 5% 1000 mL 150 mEq (02/05/19 0258)   acetaminophen **OR** acetaminophen, ondansetron **OR** ondansetron (ZOFRAN) IV    Exam: Gen pleasant, looks tired, no distress, calm and Ox3 No jvd or bruits Chest fain expiratory coarse breath sounds RRR no MRG  Abd soft ntnd no mass or ascites +bs Ext 1+ LE edema Neuro is alert, Ox 3 , nf MSK: bilateral wrists bandaged    Home meds:  - none   Renal US - negative UA 100 prot, 0-5 wbc/ rbc  Assessment/ Plan: 1. AKI - severe, rapid rise creat. 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 Looks like Cr is plateauing- uremic symptoms are mild and stable.  NO indication for dialysis today.  Continue to follow.   2. Schizophrenia  3. Bilat wrist wounds/ cellulitis - on flagyl and rocephin  4. Dispo: has a sitter     Madelon Lips 02/05/2019, 8:08 AM  Iron/TIBC/Ferritin/ %Sat No results found for: IRON, TIBC, FERRITIN,  IRONPCTSAT Recent Labs  Lab 01/29/19 1501  02/05/19 0512  NA 141   < > 138  K 3.1*   < > 3.3*  CL 105   < > 109  CO2 27   < > 19*  GLUCOSE 102*   < > 100*  BUN 23*   < > 36*  CREATININE 1.11   < > 9.20*  CALCIUM 8.8*   < > 7.6*  ALBUMIN 4.3  --   --    < > = values in this interval not displayed.   Recent Labs  Lab 01/29/19 1501  AST 29  ALT 23  ALKPHOS 56  BILITOT 2.2*  PROT 7.0   Recent Labs  Lab 02/05/19 0512  WBC 7.7  HGB 11.2*  HCT 32.5*  PLT 265

## 2019-02-05 NOTE — Clinical Social Work Note (Signed)
Contact made with attending MD regarding patient's IVC status - current IVC paperwork completed on 11/8 and has expired. Per MD, the patient has been calm and compliant during her interactions with him. She has put in a consult for psychiatry to see patient again and IVC paperwork can be deferred until patient is re-evaluated. CSW will continue to follow and provide the appropriate SW interventions based on psychiatry recommendations.  Stephonie Wilcoxen Givens, MSW, LCSW Licensed Clinical Social Worker [place] [phone]

## 2019-02-06 DIAGNOSIS — F203 Undifferentiated schizophrenia: Secondary | ICD-10-CM

## 2019-02-06 LAB — CBC WITH DIFFERENTIAL/PLATELET
Abs Immature Granulocytes: 0.03 10*3/uL (ref 0.00–0.07)
Basophils Absolute: 0.1 10*3/uL (ref 0.0–0.1)
Basophils Relative: 1 %
Eosinophils Absolute: 0.2 10*3/uL (ref 0.0–0.5)
Eosinophils Relative: 2 %
HCT: 33.8 % — ABNORMAL LOW (ref 39.0–52.0)
Hemoglobin: 11.7 g/dL — ABNORMAL LOW (ref 13.0–17.0)
Immature Granulocytes: 0 %
Lymphocytes Relative: 20 %
Lymphs Abs: 1.5 10*3/uL (ref 0.7–4.0)
MCH: 29.4 pg (ref 26.0–34.0)
MCHC: 34.6 g/dL (ref 30.0–36.0)
MCV: 84.9 fL (ref 80.0–100.0)
Monocytes Absolute: 1 10*3/uL (ref 0.1–1.0)
Monocytes Relative: 13 %
Neutro Abs: 4.7 10*3/uL (ref 1.7–7.7)
Neutrophils Relative %: 64 %
Platelets: 288 10*3/uL (ref 150–400)
RBC: 3.98 MIL/uL — ABNORMAL LOW (ref 4.22–5.81)
RDW: 12.4 % (ref 11.5–15.5)
WBC: 7.4 10*3/uL (ref 4.0–10.5)
nRBC: 0 % (ref 0.0–0.2)

## 2019-02-06 LAB — BASIC METABOLIC PANEL
Anion gap: 14 (ref 5–15)
BUN: 33 mg/dL — ABNORMAL HIGH (ref 6–20)
CO2: 21 mmol/L — ABNORMAL LOW (ref 22–32)
Calcium: 8 mg/dL — ABNORMAL LOW (ref 8.9–10.3)
Chloride: 105 mmol/L (ref 98–111)
Creatinine, Ser: 8.95 mg/dL — ABNORMAL HIGH (ref 0.61–1.24)
GFR calc Af Amer: 8 mL/min — ABNORMAL LOW (ref >60)
GFR calc non Af Amer: 7 mL/min — ABNORMAL LOW (ref >60)
Glucose, Bld: 97 mg/dL (ref 70–99)
Potassium: 3.1 mmol/L — ABNORMAL LOW (ref 3.5–5.1)
Sodium: 140 mmol/L (ref 135–145)

## 2019-02-06 MED ORDER — POTASSIUM CHLORIDE CRYS ER 20 MEQ PO TBCR
40.0000 meq | EXTENDED_RELEASE_TABLET | Freq: Once | ORAL | Status: AC
  Administered 2019-02-06: 40 meq via ORAL
  Filled 2019-02-06: qty 2

## 2019-02-06 MED ORDER — SODIUM BICARBONATE 650 MG PO TABS
650.0000 mg | ORAL_TABLET | Freq: Two times a day (BID) | ORAL | Status: DC
  Administered 2019-02-06 – 2019-02-10 (×10): 650 mg via ORAL
  Filled 2019-02-06 (×11): qty 1

## 2019-02-06 MED ORDER — TRAZODONE HCL 50 MG PO TABS
50.0000 mg | ORAL_TABLET | Freq: Every evening | ORAL | Status: DC | PRN
  Administered 2019-02-06 – 2019-02-10 (×4): 50 mg via ORAL
  Filled 2019-02-06 (×4): qty 1

## 2019-02-06 NOTE — Clinical Social Work Note (Signed)
Reviewed psychiatry note and inpatient psychiatric placement not recommended. CSW signing off, however please reconsult if any other SW services needed prior to patient's discharge.Demorio Seeley Givens, MSW, LCSW Licensed Clinical Social Worker [place] [phone]

## 2019-02-06 NOTE — Consult Note (Signed)
[facility] Face-to-Face Psychiatry Consult   Reason for Consult:  Safety sitter discontinuation Referring Physician:  Dr Horris Latino Patient Identification: Tyrone Jenkins MRN:  [id number] Principal Diagnosis: Cellulitis of arm Diagnosis:  Principal Problem:   Cellulitis of arm Active Problems:   Schizophrenia, unspecified ([place])   Necrosis (HCC)   Elevated serum creatinine   Total Time spent with patient: 1 hour  Subjective:   Tyrone Jenkins is a 37 y.o. male patient admitted with wounds.  "I'm tired right now."  Patient seen and evaluated in person by this provider.  Consult placed for safety sitter discontinuation.  Patient reports being sleepy as last night he did not sleep well.  Denies suicidal/homicidal ideations, hallucinations, or substance abuse.  No adverse medication effects.  He contracts for safety and denies paranoia.  No safety sitter needed at this time.  HPI per MD:  Tyrone Jenkins is a 38 y.o. male with medical history significant of schizophrenia presenting with acute on chronic wrist wounds.  Patient was found walking around on the road on 11/7 and was noted to have abrasions on his R wrist and under chin.  He was also found to have a large open wound to his left wrist with uneven edges, partial necrosis, and mild surrounding erythema.  Wet to dry dressings were applied.  With further evaluation, the patient was noted to have h/o schizophrenia and has spells where he doesn't talk.  Over the next few days, the thought was the surrounding erythema from his wounds may have been related to wrist restraints.  IVC paperwork was completed and he was planned to go to [facility].  However, prior to the transfer this evening, the nurse was concerned about his wounds and CT appeared to confirm cellulitis.  Hand surgery was consulted and may need to be consulted tomorrow AM for further evaluation  Past Psychiatric History: schizophrenia  Risk to Self: Suicidal Ideation: (Unable to assess  due to Pt not communicating) Suicidal Intent: (Unable to assess due to Pt not communicating) Is patient at risk for suicide?: (Unable to assess due to Pt not communicating) Suicidal Plan?: (Unable to assess due to Pt not communicating) Access to Means: (Unable to assess due to Pt not communicating) What has been your use of drugs/alcohol within the last 12 months?: Unable to assess due to Pt not communicating How many times?: (Unable to assess due to Pt not communicating) Other Self Harm Risks: Pt cut his left wrist Triggers for Past Attempts: Unknown Intentional Self Injurious Behavior: Cutting Comment - Self Injurious Behavior: Pt has cut left wrist Risk to Others: Homicidal Ideation: (Unable to assess due to Pt not communicating) Thoughts of Harm to Others: (Unable to assess due to Pt not communicating) Current Homicidal Intent: (Unable to assess due to Pt not communicating) Current Homicidal Plan: (Unable to assess due to Pt not communicating) Access to Homicidal Means: (Unable to assess due to Pt not communicating) Identified Victim: Unable to assess due to Pt not communicating History of harm to others?: (Unable to assess due to Pt not communicating) Assessment of Violence: (Unable to assess due to Pt not communicating) Violent Behavior Description: Unable to assess due to Pt not communicating Does patient have access to weapons?: (Unable to assess due to Pt not communicating) Criminal Charges Pending?: No Does patient have a court date: No Prior Inpatient Therapy:   Prior Outpatient Therapy: Prior Outpatient Therapy: Yes Prior Therapy Dates: Unable to assess due to Pt not communicating Prior Therapy Facilty/Provider(s): Unable to  assess due to Pt not communicating Reason for Treatment: Schizophrenia Does patient have an ACCT team?: No Does patient have Intensive In-House Services?  : No Does patient have [facility] services? : No Does patient have P4CC services?: No  Past Medical  History:  Past Medical History:  Diagnosis Date  . Schizophrenia (HCC)    History reviewed. No pertinent surgical history. Family History: History reviewed. No pertinent family history. Family Psychiatric  History: none Social History:  Social History   Substance and Sexual Activity  Alcohol Use None     Social History   Substance and Sexual Activity  Drug Use Not on file    Social History   Socioeconomic History  . Marital status: Single    Spouse name: Not on file  . Number of children: Not on file  . Years of education: Not on file  . Highest education level: Not on file  Occupational History  . Not on file  Social Needs  . Financial resource strain: Not on file  . Food insecurity    Worry: Not on file    Inability: Not on file  . Transportation needs    Medical: Not on file    Non-medical: Not on file  Tobacco Use  . Smoking status: Unknown If Ever Smoked  Substance and Sexual Activity  . Alcohol use: Not on file  . Drug use: Not on file  . Sexual activity: Not on file  Lifestyle  . Physical activity    Days per week: Not on file    Minutes per session: Not on file  . Stress: Not on file  Relationships  . Social Musician on phone: Not on file    Gets together: Not on file    Attends religious service: Not on file    Active member of club or organization: Not on file    Attends meetings of clubs or organizations: Not on file    Relationship status: Not on file  Other Topics Concern  . Not on file  Social History Narrative  . Not on file   Additional Social History:    Allergies:  No Known Allergies  Labs:  Results for orders placed or performed during the hospital encounter of 01/27/19 (from the past 48 hour(s))  CBC with Differential/Platelet     Status: Abnormal   Collection Time: 02/05/19  5:12 AM  Result Value Ref Range   WBC 7.7 4.0 - 10.5 K/uL   RBC 3.82 (L) 4.22 - 5.81 MIL/uL   Hemoglobin 11.2 (L) 13.0 - 17.0 g/dL   HCT  16.1 (L) 09.6 - 52.0 %   MCV 85.1 80.0 - 100.0 fL   MCH 29.3 26.0 - 34.0 pg   MCHC 34.5 30.0 - 36.0 g/dL   RDW 04.5 40.9 - 81.1 %   Platelets 265 150 - 400 K/uL   nRBC 0.0 0.0 - 0.2 %   Neutrophils Relative % 63 %   Neutro Abs 4.9 1.7 - 7.7 K/uL   Lymphocytes Relative 19 %   Lymphs Abs 1.5 0.7 - 4.0 K/uL   Monocytes Relative 15 %   Monocytes Absolute 1.1 (H) 0.1 - 1.0 K/uL   Eosinophils Relative 2 %   Eosinophils Absolute 0.2 0.0 - 0.5 K/uL   Basophils Relative 1 %   Basophils Absolute 0.1 0.0 - 0.1 K/uL   Immature Granulocytes 0 %   Abs Immature Granulocytes 0.03 0.00 - 0.07 K/uL    Comment: Performed at [facility], [address], [place], [place] [address]  Basic metabolic panel     Status: Abnormal   Collection Time: 02/05/19  5:12 AM  Result Value Ref Range   Sodium 138 135 - 145 mmol/L   Potassium 3.3 (L) 3.5 - 5.1 mmol/L   Chloride 109 98 - 111 mmol/L   CO2 19 (L) 22 - 32 mmol/L   Glucose, Bld 100 (H) 70 - 99 mg/dL   BUN 36 (H) 6 - 20 mg/dL   Creatinine, Ser 6.30 (H) 0.61 - 1.24 mg/dL   Calcium 7.6 (L) 8.9 - 10.3 mg/dL   GFR calc non Af Amer 6 (L) >60 mL/min   GFR calc Af Amer 7 (L) >60 mL/min   Anion gap 10 5 - 15    Comment: Performed at [facility], [address] [place], [place] [address]  CBC with Differential/Platelet     Status: Abnormal   Collection Time: 02/06/19  4:43 AM  Result Value Ref Range   WBC 7.4 4.0 - 10.5 K/uL   RBC 3.98 (L) 4.22 - 5.81 MIL/uL   Hemoglobin 11.7 (L) 13.0 - 17.0 g/dL   HCT 93.2 (L) 35.5 - 73.2 %   MCV 84.9 80.0 - 100.0 fL   MCH 29.4 26.0 - 34.0 pg   MCHC 34.6 30.0 - 36.0 g/dL   RDW 20.2 54.2 - 70.6 %   Platelets 288 150 - 400 K/uL   nRBC 0.0 0.0 - 0.2 %   Neutrophils Relative % 64 %   Neutro Abs 4.7 1.7 - 7.7 K/uL   Lymphocytes Relative 20 %   Lymphs Abs 1.5 0.7 - 4.0 K/uL   Monocytes Relative 13 %   Monocytes Absolute 1.0 0.1 - 1.0 K/uL   Eosinophils Relative 2 %   Eosinophils Absolute 0.2 0.0 - 0.5  K/uL   Basophils Relative 1 %   Basophils Absolute 0.1 0.0 - 0.1 K/uL   Immature Granulocytes 0 %   Abs Immature Granulocytes 0.03 0.00 - 0.07 K/uL    Comment: Performed at [facility], [address], [place], [place] [address]  Basic metabolic panel     Status: Abnormal   Collection Time: 02/06/19  4:43 AM  Result Value Ref Range   Sodium 140 135 - 145 mmol/L   Potassium 3.1 (L) 3.5 - 5.1 mmol/L   Chloride 105 98 - 111 mmol/L   CO2 21 (L) 22 - 32 mmol/L   Glucose, Bld 97 70 - 99 mg/dL   BUN 33 (H) 6 - 20 mg/dL   Creatinine, Ser 8.31 (H) 0.61 - 1.24 mg/dL   Calcium 8.0 (L) 8.9 - 10.3 mg/dL   GFR calc non Af Amer 7 (L) >60 mL/min   GFR calc Af Amer 8 (L) >60 mL/min   Anion gap 14 5 - 15    Comment: Performed at [facility], [address] [place], [place] [address]    Current Facility-Administered Medications  Medication Dose Route Frequency Provider Last Rate Last Dose  . acetaminophen (TYLENOL) tablet 650 mg  650 mg Oral Q6H PRN Jonah Blue, MD   650 mg at 02/02/19 1347   Or  . acetaminophen (TYLENOL) suppository 650 mg  650 mg Rectal Q6H PRN Jonah Blue, MD      . benztropine (COGENTIN) tablet 1 mg  1 mg Oral Daily Jonah Blue, MD   1 mg at 02/06/19 0848   Or  . benztropine mesylate (COGENTIN) injection  1 mg  1 mg Intramuscular Daily Jonah Blue, MD      . cefTRIAXone (ROCEPHIN) 1 g in sodium chloride 0.9 % 100 mL IVPB  1 g Intravenous Q24H Alwyn Ren, MD 200 mL/hr at 02/06/19 0852 1 g at 02/06/19 0852  . docusate sodium (COLACE) capsule 100 mg  100 mg Oral BID Jonah Blue, MD   100 mg at 02/06/19 0819  . enoxaparin (LOVENOX) injection 30 mg  30 mg Subcutaneous Q24H Alwyn Ren, MD      . haloperidol (HALDOL) tablet 5 mg  5 mg Oral Daily Jonah Blue, MD   5 mg at 02/06/19 7106   Or  . haloperidol lactate (HALDOL) injection 5 mg  5 mg Intramuscular Daily Jonah Blue, MD      . LORazepam (ATIVAN) tablet 2 mg  2 mg  Oral Daily Jonah Blue, MD   2 mg at 02/06/19 2694   Or  . LORazepam (ATIVAN) injection 2 mg  2 mg Intramuscular Daily Jonah Blue, MD      . mupirocin ointment (BACTROBAN) 2 % 1 application  1 application Nasal BID Alberteen Sam, MD   1 application at 02/06/19 1234  . ondansetron (ZOFRAN) tablet 4 mg  4 mg Oral Q6H PRN Jonah Blue, MD   4 mg at 02/02/19 2228   Or  . ondansetron [facility]) injection 4 mg  4 mg Intravenous Q6H PRN Jonah Blue, MD   4 mg at 02/06/19 0819  . sodium bicarbonate tablet 650 mg  650 mg Oral BID Bufford Buttner, MD   650 mg at 02/06/19 1232    Musculoskeletal: Strength & Muscle Tone: decreased Gait & Station: normal Patient leans: N/A  Psychiatric Specialty Exam: Physical Exam  Nursing note and vitals reviewed. Constitutional: He is oriented to person, place, and time. He appears well-developed and well-nourished.  HENT:  Head: Normocephalic.  Neck: Normal range of motion.  Respiratory: Effort normal.  Musculoskeletal: Normal range of motion.  Neurological: He is alert and oriented to person, place, and time.  Psychiatric: His speech is normal and behavior is normal. Judgment and thought content normal. His affect is blunt. Cognition and memory are normal.    Review of Systems  Psychiatric/Behavioral: The patient has insomnia.   All other systems reviewed and are negative.   Blood pressure (!) 166/95, pulse 61, temperature 98.6 F (37 C), temperature source Oral, resp. rate 18, height 6' 0.01" (1.829 m), weight 110.6 kg, SpO2 96 %.Body mass index is 33.07 kg/m.  General Appearance: Casual  Eye Contact:  Good  Speech:  Normal Rate  Volume:  Decreased  Mood:  Euthymic  Affect:  Blunt  Thought Process:  Coherent  Orientation:  Full (Time, Place, and Person)  Thought Content:  WDL and Logical  Suicidal Thoughts:  No  Homicidal Thoughts:  No  Memory:  Immediate;   Good Recent;   Good Remote;   Good  Judgement:  Fair   Insight:  Fair  Psychomotor Activity:  Decreased  Concentration:  Concentration: Fair and Attention Span: Fair  Recall:  [employer] of Knowledge:  Fair  Language:  Good  Akathisia:  No  Handed:  Right  AIMS (if indicated):     Assets:  Leisure Time Physical Health Resilience  ADL's:  Intact  Cognition:  WNL  Sleep:      38 year old male admitted with confusion and disorientation, history of schizophrenia.  That was on admission 10 days ago and is no longer confused.  Denies suicidal/homicidal ideations, hallucinations, and subs abuse.  Denies adverse effects of medications.  Treatment Plan Summary: Schizophrenia, undifferentiated type: -Continue Haldol 5 mg daily -Discontinue safety sitter  EPS: -Continue Cogentin 1 mg daily -Continue Ativan 2 mg daily  Disposition: No evidence of imminent risk to self or others at present.    Nanine MeansJamison Chantele Corado, NP 02/06/2019 2:38 PM

## 2019-02-06 NOTE — Progress Notes (Signed)
PROGRESS NOTE    Tyrone Jenkins  CBJ:[id number] DOB: 09-27-1980 DOA: 01/27/2019 PCP: Patient, No Pcp Per      Brief Narrative:  Tyrone Jenkins is a 38 y.o. M with schizophrenia who presented with being found wandering. From H&P: "Patient was found walking around on the road on 11/7 and was noted to have abrasions on his R wrist and under chin.  He was also found to have a large open wound to his left wrist with uneven edges, partial necrosis, and mild surrounding erythema.  Wet to dry dressings were applied.  With further evaluation, the patient was noted to have h/o schizophrenia and has spells where he doesn't talk. Over the next few days, the thought was the surrounding erythema from his wounds may have been related to wrist restraints.  IVC paperwork was completed and he was planned to go to [facility]. However, prior to the transfer, CT appeared to confirm cellulitis.  Hand surgery was consulted. Patient subsequently developed severe renal failure.   Assessment & Plan:  Acute renal failure, with oliguria Ongoing Creatinine baseline appears to be around 1.1-1.4 Between 11/9 and 11/10, it jumped from 1.5 to 4.5 and has been rising, now 9.2 UA shows bland cases, no cells Renal US showed no hydronephrosis. FeNa <1% Nephrology on board, no need for HD today, continue to monitor Stopped bicarb drip on 02/06/19, restart bicarb drip Strict I/Os, daily weights  Bilateral wrist cellulitis Currently afebrile, with no leukocytosis S/P Vanco, Zosyn, and clindamycin at the time of admission   Continue ceftriaxone Dr Claudia Desanctis from plastic surgery was consulted, recommend debridement is less urgent than addressing renal failure, and could be completed at a later day  Schizophrenia Stable, oriented, denies any suicidal ideation/homicidal ideation, hallucinations No longer requiring IVC Psychiatry reevaluated patient on 02/06/2019, [place] one-to-one sitter Continue Ativan, Cogentin, Haldol       DVT prophylaxis: Lovenox, renally dosed Code Status: Full code Family Communication: None at bedside    Consultants:   Hand surgery  Nephrology  TTS  Procedures:   11/9 CT forearm - cellultiis of wrist  11/11 US renal - unremarkable  Antimicrobials:   Vancomycin 11/9-11/10  Flagyl x1 11/11  Clindamycin x1 11/9  Zosyn x111/9  Ceftriaxone IM x1 11/9, restarted 11/12 >>   Subjective: Patient seen and examined at bedside.  Noted to be sleepy this morning, easily arousable.  Reported not sleeping well overnight.  Noted an episode of nausea and vomiting, denies any abdominal pain, chest pain, shortness of breath.    Objective: Vitals:   02/05/19 0700 02/05/19 1005 02/05/19 2125 02/06/19 0424  BP: (!) 148/96 (!) 145/91 (!) 178/93 (!) 166/95  Pulse: 61 (!) 53 (!) 56 61  Resp: 19 20 20 18   Temp: 98.4 F (36.9 C) 98.7 F (37.1 C) 98.6 F (37 C) 98.6 F (37 C)  TempSrc: Oral Oral Oral Oral  SpO2: 98% 94% 97% 96%  Weight:    110.6 kg  Height:        Intake/Output Summary (Last 24 hours) at 02/06/2019 1604 Last data filed at 02/06/2019 1000 Gross per 24 hour  Intake 779.55 ml  Output 750 ml  Net 29.55 ml   Filed Weights   02/04/19 0447 02/04/19 1000 02/06/19 0424  Weight: 108.4 kg 109 kg 110.6 kg    Examination:  General: NAD   Cardiovascular: S1, S2 present  Respiratory: CTAB  Abdomen: Soft, nontender, nondistended, bowel sounds present  Musculoskeletal: No bilateral pedal edema noted  Skin:  Dressing noted on bilateral wrists, C/D/I  Psychiatry: Normal mood    Data Reviewed: I have personally reviewed following labs and imaging studies:  CBC: Recent Labs  Lab 01/31/19 0547 02/02/19 0635 02/04/19 0456 02/05/19 0512 02/06/19 0443  WBC 9.6 7.4 7.6 7.7 7.4  NEUTROABS  --   --  5.0 4.9 4.7  HGB 12.3* 11.1* 11.2* 11.2* 11.7*  HCT 36.8* 34.0* 33.7* 32.5* 33.8*  MCV 90.6 90.7 88.5 85.1 84.9  PLT 218 184 259 265 288   Basic Metabolic  Panel: Recent Labs  Lab 02/02/19 0635 02/03/19 1031 02/04/19 0456 02/05/19 0512 02/06/19 0443  NA 139 138 139 138 140  K 3.5 3.6 3.4* 3.3* 3.1*  CL 110 111 108 109 105  CO2 17* 17* 18* 19* 21*  GLUCOSE 90 98 86 100* 97  BUN 40* 38* 36* 36* 33*  CREATININE 8.05* 8.88* 9.14* 9.20* 8.95*  CALCIUM 7.6* 7.5* 7.9* 7.6* 8.0*   GFR: Estimated Creatinine Clearance: 14.2 mL/min (A) (by C-G formula based on SCr of 8.95 mg/dL (H)). Liver Function Tests: No results for input(s): AST, ALT, ALKPHOS, BILITOT, PROT, ALBUMIN in the last 168 hours. No results for input(s): LIPASE, AMYLASE in the last 168 hours. No results for input(s): AMMONIA in the last 168 hours. Coagulation Profile: No results for input(s): INR, PROTIME in the last 168 hours. Cardiac Enzymes: No results for input(s): CKTOTAL, CKMB, CKMBINDEX, TROPONINI in the last 168 hours. BNP (last 3 results) No results for input(s): PROBNP in the last 8760 hours. HbA1C: No results for input(s): HGBA1C in the last 72 hours. CBG: No results for input(s): GLUCAP in the last 168 hours. Lipid Profile: No results for input(s): CHOL, HDL, LDLCALC, TRIG, CHOLHDL, LDLDIRECT in the last 72 hours. Thyroid Function Tests: No results for input(s): TSH, T4TOTAL, FREET4, T3FREE, THYROIDAB in the last 72 hours. Anemia Panel: No results for input(s): VITAMINB12, FOLATE, FERRITIN, TIBC, IRON, RETICCTPCT in the last 72 hours. Urine analysis:    Component Value Date/Time   COLORURINE YELLOW 02/01/2019 0618   APPEARANCEUR CLEAR 02/01/2019 0618   LABSPEC 1.008 02/01/2019 0618   PHURINE 6.0 02/01/2019 0618   GLUCOSEU NEGATIVE 02/01/2019 0618   HGBUR NEGATIVE 02/01/2019 0618   BILIRUBINUR NEGATIVE 02/01/2019 0618   KETONESUR NEGATIVE 02/01/2019 0618   PROTEINUR 100 (A) 02/01/2019 0618   NITRITE NEGATIVE 02/01/2019 0618   LEUKOCYTESUR NEGATIVE 02/01/2019 0618   Sepsis Labs: @LABRCNTIP (procalcitonin:4,lacticacidven:4)  ) No results found for  this or any previous visit (from the past 240 hour(s)).       Radiology Studies: No results found.      Scheduled Meds: . benztropine  1 mg Oral Daily   Or  . benztropine mesylate  1 mg Intramuscular Daily  . docusate sodium  100 mg Oral BID  . enoxaparin (LOVENOX) injection  30 mg Subcutaneous Q24H  . haloperidol  5 mg Oral Daily   Or  . haloperidol lactate  5 mg Intramuscular Daily  . LORazepam  2 mg Oral Daily   Or  . LORazepam  2 mg Intramuscular Daily  . mupirocin ointment  1 application Nasal BID  . sodium bicarbonate  650 mg Oral BID   Continuous Infusions: . cefTRIAXone (ROCEPHIN)  IV 1 g (02/06/19 0852)     LOS: 7 days        02/08/19, MD [facility] 02/06/2019, 4:04 PM

## 2019-02-06 NOTE — Progress Notes (Signed)
  [facility] Progress Note    Assessment/ Plan:   1. AKI - severe, rapid rise creat. 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 Looks like Cr is finally downtrending with increase in UOP.  Stop bicarb gtt, convert to oral bicarb.  No indication for RRT.  Will continue to follow.    2. Schizophrenia  3. Bilat wrist wounds/ cellulitis - on flagyl and rocephin  4. Dispo: has a Actuary  Subjective:    Cr finally coming down.  Sleeping, arousable.  Noted he had a little nausea overnight.  On RA.     Objective:   BP (!) 166/95 (BP Location: Right Arm)   Pulse 61   Temp 98.6 F (37 C) (Oral)   Resp 18   Ht 6' 0.01" (1.829 m)   Wt 110.6 kg   SpO2 96%   BMI 33.07 kg/m   Intake/Output Summary (Last 24 hours) at 02/06/2019 1102 Last data filed at 02/06/2019 0622 Gross per 24 hour  Intake 689.63 ml  Output 800 ml  Net -110.37 ml   Weight change: 1.632 kg  Physical Exam: SWN:IOEVOJJK, lying in bed, NAD CVS: RRR no m/r/g Resp: normal WOB on RA Abd: soft, nontender, NABS Ext:1+ LE edema  Imaging: No results found.  Labs: BMET Recent Labs  Lab 01/31/19 1827 02/01/19 0533 02/02/19 0635 02/03/19 1031 02/04/19 0456 02/05/19 0512 02/06/19 0443  NA 140 137 139 138 139 138 140  K 3.2* 3.5 3.5 3.6 3.4* 3.3* 3.1*  CL 108 107 110 111 108 109 105  CO2 22 19* 17* 17* 18* 19* 21*  GLUCOSE 128* 105* 90 98 86 100* 97  BUN 36* 36* 40* 38* 36* 36* 33*  CREATININE 5.71* 6.80* 8.05* 8.88* 9.14* 9.20* 8.95*  CALCIUM 7.7* 7.6* 7.6* 7.5* 7.9* 7.6* 8.0*   CBC Recent Labs  Lab 02/02/19 0635 02/04/19 0456 02/05/19 0512 02/06/19 0443  WBC 7.4 7.6 7.7 7.4  NEUTROABS  --  5.0 4.9 4.7  HGB 11.1* 11.2* 11.2* 11.7*  HCT 34.0* 33.7* 32.5* 33.8*  MCV 90.7 88.5 85.1 84.9  PLT 184 259 265 288    Medications:    . benztropine  1 mg Oral Daily   Or  . benztropine mesylate  1 mg Intramuscular Daily  . docusate sodium  100 mg Oral BID  . enoxaparin (LOVENOX) injection  30 mg  Subcutaneous Q24H  . haloperidol  5 mg Oral Daily   Or  . haloperidol lactate  5 mg Intramuscular Daily  . LORazepam  2 mg Oral Daily   Or  . LORazepam  2 mg Intramuscular Daily  . mupirocin ointment  1 application Nasal BID      Madelon Lips, MD 02/06/2019, 11:02 AM

## 2019-02-06 NOTE — Progress Notes (Signed)
Dressing changes complete on both wrist.

## 2019-02-07 LAB — CBC WITH DIFFERENTIAL/PLATELET
Abs Immature Granulocytes: 0.04 10*3/uL (ref 0.00–0.07)
Basophils Absolute: 0.1 10*3/uL (ref 0.0–0.1)
Basophils Relative: 1 %
Eosinophils Absolute: 0.2 10*3/uL (ref 0.0–0.5)
Eosinophils Relative: 2 %
HCT: 31.6 % — ABNORMAL LOW (ref 39.0–52.0)
Hemoglobin: 10.8 g/dL — ABNORMAL LOW (ref 13.0–17.0)
Immature Granulocytes: 1 %
Lymphocytes Relative: 22 %
Lymphs Abs: 1.6 10*3/uL (ref 0.7–4.0)
MCH: 29.1 pg (ref 26.0–34.0)
MCHC: 34.2 g/dL (ref 30.0–36.0)
MCV: 85.2 fL (ref 80.0–100.0)
Monocytes Absolute: 1 10*3/uL (ref 0.1–1.0)
Monocytes Relative: 13 %
Neutro Abs: 4.5 10*3/uL (ref 1.7–7.7)
Neutrophils Relative %: 61 %
Platelets: 301 10*3/uL (ref 150–400)
RBC: 3.71 MIL/uL — ABNORMAL LOW (ref 4.22–5.81)
RDW: 12.6 % (ref 11.5–15.5)
WBC: 7.3 10*3/uL (ref 4.0–10.5)
nRBC: 0 % (ref 0.0–0.2)

## 2019-02-07 LAB — SURGICAL PCR SCREEN
MRSA, PCR: NEGATIVE
Staphylococcus aureus: NEGATIVE

## 2019-02-07 LAB — BASIC METABOLIC PANEL
Anion gap: 11 (ref 5–15)
BUN: 30 mg/dL — ABNORMAL HIGH (ref 6–20)
CO2: 23 mmol/L (ref 22–32)
Calcium: 7.7 mg/dL — ABNORMAL LOW (ref 8.9–10.3)
Chloride: 108 mmol/L (ref 98–111)
Creatinine, Ser: 8.61 mg/dL — ABNORMAL HIGH (ref 0.61–1.24)
GFR calc Af Amer: 8 mL/min — ABNORMAL LOW (ref >60)
GFR calc non Af Amer: 7 mL/min — ABNORMAL LOW (ref >60)
Glucose, Bld: 91 mg/dL (ref 70–99)
Potassium: 3.2 mmol/L — ABNORMAL LOW (ref 3.5–5.1)
Sodium: 142 mmol/L (ref 135–145)

## 2019-02-07 MED ORDER — POTASSIUM CHLORIDE CRYS ER 20 MEQ PO TBCR
40.0000 meq | EXTENDED_RELEASE_TABLET | Freq: Once | ORAL | Status: AC
  Administered 2019-02-07: 40 meq via ORAL

## 2019-02-07 NOTE — Evaluation (Signed)
Physical Therapy Evaluation Patient Details Name: Tyrone Jenkins MRN: [id number] DOB: 1981-02-10 Today's Date: 02/07/2019   History of Present Illness  38yo male found walking at the edge of the road on 11/7 with B wrist wounds and chin abrasion, partial necrosis on L wrist wound. Possible cellulitis per CT. Admitted for B UE cellulitis, schizophrenia. PMH schizophrenia  Clinical Impression   Patient received up in chair, flat affect but very pleasant and cooperative with PT today. Able to complete all functional transfers, gait approximately 274f with IV pole with no significant gait or balance deviation noted. He reports he feels mostly at baseline, some reduced functional activity tolerance but otherwise no concerns or complaints. He was left up in the chair with all needs met this morning. He is not in need of skilled therapy interventions at this point. PT signing off- thank you for the referral!     Follow Up Recommendations No PT follow up    Equipment Recommendations  None recommended by PT    Recommendations for Other Services       Precautions / Restrictions Precautions Precautions: None Restrictions Weight Bearing Restrictions: No      Mobility  Bed Mobility               General bed mobility comments: OOB in chair  Transfers Overall transfer level: Independent Equipment used: None                Ambulation/Gait Ambulation/Gait assistance: Independent Gait Distance (Feet): 200 Feet Assistive device: IV Pole Gait Pattern/deviations: WFL(Within Functional Limits);Step-through pattern Gait velocity: WNL   General Gait Details: managed IV pole independently, no significant gait or balance impairment noted  Stairs            Wheelchair Mobility    Modified Rankin (Stroke Patients Only)       Balance Overall balance assessment: Independent                                           Pertinent Vitals/Pain Pain  Assessment: 0-10 Pain Score: 2  Pain Location: B wrists Pain Descriptors / Indicators: Sore Pain Intervention(s): Limited activity within patient's tolerance;Monitored during session    Home Living Family/patient expects to be discharged to:: Private residence(condo) Living Arrangements: Parent Available Help at Discharge: Family;Available 24 hours/day Type of Home: House Home Access: Ramped entrance     Home Layout: One level Home Equipment: None Additional Comments: engaged to be married    Prior Function Level of Independence: Independent               HJournalist, newspaper       Extremity/Trunk Assessment   Upper Extremity Assessment Upper Extremity Assessment: Overall WFL for tasks assessed    Lower Extremity Assessment Lower Extremity Assessment: Overall WFL for tasks assessed    Cervical / Trunk Assessment Cervical / Trunk Assessment: Normal  Communication   Communication: No difficulties  Cognition Arousal/Alertness: Awake/alert Behavior During Therapy: Flat affect Overall Cognitive Status: Within Functional Limits for tasks assessed                                 General Comments: very calm and cooperative with therapy      General Comments      Exercises     Assessment/Plan    PT  Assessment Patent does not need any further PT services  PT Problem List         PT Treatment Interventions      PT Goals (Current goals can be found in the Care Plan section)  Acute Rehab PT Goals Patient Stated Goal: feel better PT Goal Formulation: With patient Time For Goal Achievement: 02/21/19 Potential to Achieve Goals: Good    Frequency     Barriers to discharge        Co-evaluation               AM-PAC PT "6 Clicks" Mobility  Outcome Measure Help needed turning from your back to your side while in a flat bed without using bedrails?: None Help needed moving from lying on your back to sitting on the side of a flat bed  without using bedrails?: None Help needed moving to and from a bed to a chair (including a wheelchair)?: None Help needed standing up from a chair using your arms (e.g., wheelchair or bedside chair)?: None Help needed to walk in hospital room?: None Help needed climbing 3-5 steps with a railing? : None 6 Click Score: 24    End of Session   Activity Tolerance: Patient tolerated treatment well Patient left: in chair;with call bell/phone within reach   PT Visit Diagnosis: Pain Pain - Right/Left: (bilateral) Pain - part of body: (wrists)    Time: 5797-2820 PT Time Calculation (min) (ACUTE ONLY): 10 min   Charges:   PT Evaluation $PT Eval Low Complexity: 1 Low          Windell Norfolk, DPT, PN1   Supplemental Physical Therapist [place]    Pager [phone] Acute Rehab Office [phone]

## 2019-02-07 NOTE — Progress Notes (Signed)
Saw patient in his hospital room today.  He is overall much more communicative than he was previously.  He feels like things are moving in the right direction and is encouraged by his kidney numbers.  He feels well with no fevers and feels like his wrist is getting better slowly.  On exam he has bilateral wrist wounds recently dressed.  All the compartments are soft.  He has full range of motion and strength and sensation in both hands.  His creatinine is improving slowly now and he does not have an elevated white count  I discussed debridement of the left wrist wound with him.  I explained that he would benefit from removing the necrotic tissue and placement of a wound matrix to help with healing.  I did explain that this could be postponed if there was any concern regarding its effect on his kidney function.  I am going to touch base with the hospitalist and nephrologist involved in his care and will make a decision about safety moving forward.  I am hopeful that if his creatinine continues to trend down then we can debride this on Friday.  I tried again to get a sense of the origin of this wound however the patient really has no idea.  I did bring up the possibility of a spider snakebite and patient says he has been bitten by a spider before but did not have this degree of a reaction.

## 2019-02-07 NOTE — Progress Notes (Signed)
PROGRESS NOTE    Tyrone Jenkins  HYQ:[id number] DOB: 14-Jan-1981 DOA: 01/27/2019 PCP: Patient, No Pcp Per     Brief Narrative:  Tyrone Jenkins is a 38 y.o. M with schizophrenia who presented with being found wandering. Patient was found walking around on the road on 11/7 and was noted to have abrasions on his R wrist and under chin. He was also found to have a large open wound to his left wrist with uneven edges, partial necrosis, and mild surrounding erythema. Wet to dry dressings were applied. With further evaluation, the patient was noted to have h/o schizophrenia and has spells where he doesn't talk. Over the next few days, the thought was the surrounding erythema from his wounds may have been related to wrist restraints. IVC paperwork was completed and he was planned to go [facility]. However, prior to the transfer, CT appeared to confirm cellulitis. Hand surgery was consulted. Patient subsequently developed severe renal failure while on vancomycin.  New events last 24 hours / Subjective: No acute complaints today. Wants to shower. States his wrist looks better. Denies suicidal or homicidal ideations.   Assessment & Plan:   Principal Problem:   Cellulitis of arm Active Problems:   Schizophrenia, unspecified (HCC)   Necrosis (HCC)   Elevated serum creatinine    AKI  -Creatinine baseline appears to be around 1.1-1.4 -Renal US showed no hydronephrosis -FeNa <1% -Improving. Nephrology following  Bilateral wrist cellulitis -S/P Vanco, Zosyn, and clindamycin at the time of admission   -Continue ceftriaxone -Dr Arita MissPace from plastic surgery was consulted, holding off on debridement currently   Schizophrenia -No longer requiring IVC -Psychiatry reevaluated patient on 02/06/2019, DC'd one-to-one sitter -Continue Ativan, Cogentin, Haldol  Hypokalemia -Replace, trend    DVT prophylaxis: Lovenox  Code Status: Full Family Communication: None at bedside Disposition  Plan: Pending improvement in Cr    Consultants:   Hand surgery  Psychiatry  Nephrology   Antimicrobials:  Anti-infectives (From admission, onward)   Start     Dose/Rate Route Frequency Ordered Stop   02/01/19 1000  cefTRIAXone (ROCEPHIN) 1 g in sodium chloride 0.9 % 100 mL IVPB     1 g 200 mL/hr over 30 Minutes Intravenous Every 24 hours 01/31/19 1210     01/31/19 1300  metroNIDAZOLE (FLAGYL) IVPB 500 mg  Status:  Discontinued     500 mg 100 mL/hr over 60 Minutes Intravenous Every 8 hours 01/31/19 1210 02/01/19 0750   01/31/19 1000  vancomycin (VANCOCIN) 1,250 mg in sodium chloride 0.9 % 250 mL IVPB  Status:  Discontinued     1,250 mg 166.7 mL/hr over 90 Minutes Intravenous Every 12 hours 01/30/19 0053 01/30/19 1058   01/31/19 0941  vancomycin variable dose per unstable renal function (pharmacist dosing)  Status:  Discontinued      Does not apply See admin instructions 01/31/19 0942 01/31/19 1206   01/31/19 0600  vancomycin (VANCOCIN) 2,000 mg in sodium chloride 0.9 % 500 mL IVPB  Status:  Discontinued     2,000 mg 250 mL/hr over 120 Minutes Intravenous Every 24 hours 01/30/19 1058 01/31/19 0941   01/29/19 2115  vancomycin (VANCOCIN) 2,000 mg in sodium chloride 0.9 % 500 mL IVPB  Status:  Discontinued     2,000 mg 250 mL/hr over 120 Minutes Intravenous  Once 01/29/19 2100 01/30/19 0145   01/29/19 2115  clindamycin (CLEOCIN) IVPB 900 mg  Status:  Discontinued     900 mg 100 mL/hr over 30 Minutes Intravenous  Once 01/29/19 2100 01/30/19 0027   01/29/19 2115  piperacillin-tazobactam (ZOSYN) IVPB 3.375 g     3.375 g 100 mL/hr over 30 Minutes Intravenous  Once 01/29/19 2100 01/30/19 0027   01/29/19 2000  cefTRIAXone (ROCEPHIN) injection 250 mg  Status:  Discontinued     250 mg Intramuscular Every 24 hours 01/29/19 1931 01/29/19 2058   01/28/19 1400  cephALEXin (KEFLEX) capsule 500 mg  Status:  Discontinued     500 mg Oral Every 8 hours 01/28/19 1308 01/29/19 1931         Objective: Vitals:   02/06/19 1707 02/06/19 2002 02/07/19 0444 02/07/19 0851  BP: (!) 177/100 (!) 154/94 (!) 163/103 (!) 142/84  Pulse: 62 75 78 88  Resp: 18 16 18 18   Temp: 98.5 F (36.9 C) 98.7 F (37.1 C) 99 F (37.2 C) 98.2 F (36.8 C)  TempSrc: Oral Oral Oral Oral  SpO2: 98% 96% 96% 93%  Weight:   110 kg   Height:        Intake/Output Summary (Last 24 hours) at 02/07/2019 1243 Last data filed at 02/07/2019 6063 Gross per 24 hour  Intake 300 ml  Output 0 ml  Net 300 ml   Filed Weights   02/04/19 1000 02/06/19 0424 02/07/19 0444  Weight: 109 kg 110.6 kg 110 kg    Examination:  General exam: Appears calm and comfortable  Respiratory system: Clear to auscultation. Respiratory effort normal. No respiratory distress. No conversational dyspnea.  Cardiovascular system: S1 & S2 heard, RRR. No murmurs. No pedal edema. Gastrointestinal system: Abdomen is nondistended, soft and nontender. Normal bowel sounds heard. Central nervous system: Alert and oriented. No focal neurological deficits. Speech clear.  Extremities: Symmetric in appearance   Skin: Bilateral wrists with clean and dry dressing in place  Psychiatry: Judgement and insight appear normal. Mood & affect appropriate.   Data Reviewed: I have personally reviewed following labs and imaging studies  CBC: Recent Labs  Lab 02/02/19 0635 02/04/19 0456 02/05/19 0512 02/06/19 0443 02/07/19 0415  WBC 7.4 7.6 7.7 7.4 7.3  NEUTROABS  --  5.0 4.9 4.7 4.5  HGB 11.1* 11.2* 11.2* 11.7* 10.8*  HCT 34.0* 33.7* 32.5* 33.8* 31.6*  MCV 90.7 88.5 85.1 84.9 85.2  PLT 184 259 265 288 016   Basic Metabolic Panel: Recent Labs  Lab 02/03/19 1031 02/04/19 0456 02/05/19 0512 02/06/19 0443 02/07/19 0415  NA 138 139 138 140 142  K 3.6 3.4* 3.3* 3.1* 3.2*  CL 111 108 109 105 108  CO2 17* 18* 19* 21* 23  GLUCOSE 98 86 100* 97 91  BUN 38* 36* 36* 33* 30*  CREATININE 8.88* 9.14* 9.20* 8.95* 8.61*  CALCIUM 7.5* 7.9* 7.6* 8.0*  7.7*   GFR: Estimated Creatinine Clearance: 14.8 mL/min (A) (by C-G formula based on SCr of 8.61 mg/dL (H)). Liver Function Tests: No results for input(s): AST, ALT, ALKPHOS, BILITOT, PROT, ALBUMIN in the last 168 hours. No results for input(s): LIPASE, AMYLASE in the last 168 hours. No results for input(s): AMMONIA in the last 168 hours. Coagulation Profile: No results for input(s): INR, PROTIME in the last 168 hours. Cardiac Enzymes: No results for input(s): CKTOTAL, CKMB, CKMBINDEX, TROPONINI in the last 168 hours. BNP (last 3 results) No results for input(s): PROBNP in the last 8760 hours. HbA1C: No results for input(s): HGBA1C in the last 72 hours. CBG: No results for input(s): GLUCAP in the last 168 hours. Lipid Profile: No results for input(s): CHOL, HDL, LDLCALC, TRIG, CHOLHDL,  LDLDIRECT in the last 72 hours. Thyroid Function Tests: No results for input(s): TSH, T4TOTAL, FREET4, T3FREE, THYROIDAB in the last 72 hours. Anemia Panel: No results for input(s): VITAMINB12, FOLATE, FERRITIN, TIBC, IRON, RETICCTPCT in the last 72 hours. Sepsis Labs: No results for input(s): PROCALCITON, LATICACIDVEN in the last 168 hours.  No results found for this or any previous visit (from the past 240 hour(s)).    Radiology Studies: No results found.    Scheduled Meds: . benztropine  1 mg Oral Daily   Or  . benztropine mesylate  1 mg Intramuscular Daily  . docusate sodium  100 mg Oral BID  . enoxaparin (LOVENOX) injection  30 mg Subcutaneous Q24H  . haloperidol  5 mg Oral Daily   Or  . haloperidol lactate  5 mg Intramuscular Daily  . LORazepam  2 mg Oral Daily   Or  . LORazepam  2 mg Intramuscular Daily  . sodium bicarbonate  650 mg Oral BID   Continuous Infusions: . cefTRIAXone (ROCEPHIN)  IV 1 g (02/07/19 1035)     LOS: 8 days      Time spent: 40 minutes   Noralee Stain, DO [facility] 02/07/2019, 12:43 PM   Available via Epic secure chat 7am-7pm After  these hours, please refer to coverage provider listed on [email or web address]

## 2019-02-07 NOTE — Progress Notes (Signed)
  [facility] Progress Note    Assessment/ Plan:   1. AKI - severe, rapid rise creat. 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 Looks like Cr is finally downtrending with increase in UOP.  Now on oral bicarb  No indication for RRT.  Will continue to follow.  Cr 8.6 this AM.  Excellent  2. Schizophrenia  3. Bilat wrist wounds/ cellulitis - on flagyl and rocephin  4. Dispo: has a Actuary  Subjective:    Cr inching downwards.  Recovering.     Objective:   BP (!) 142/84 (BP Location: Right Arm)   Pulse 88   Temp 98.2 F (36.8 C) (Oral)   Resp 18   Ht 6' 0.01" (1.829 m)   Wt 110 kg   SpO2 93%   BMI 32.88 kg/m   Intake/Output Summary (Last 24 hours) at 02/07/2019 1048 Last data filed at 02/07/2019 0620 Gross per 24 hour  Intake 300 ml  Output 0 ml  Net 300 ml   Weight change: -0.632 kg  Physical Exam: [id number], lying in bed, NAD CVS: RRR no m/r/g Resp: normal WOB on RA Abd: soft, nontender, NABS Ext:1+ LE edema  Imaging: No results found.  Labs: BMET Recent Labs  Lab 02/01/19 0533 02/02/19 0635 02/03/19 1031 02/04/19 0456 02/05/19 0512 02/06/19 0443 02/07/19 0415  NA 137 139 138 139 138 140 142  K 3.5 3.5 3.6 3.4* 3.3* 3.1* 3.2*  CL 107 110 111 108 109 105 108  CO2 19* 17* 17* 18* 19* 21* 23  GLUCOSE 105* 90 98 86 100* 97 91  BUN 36* 40* 38* 36* 36* 33* 30*  CREATININE 6.80* 8.05* 8.88* 9.14* 9.20* 8.95* 8.61*  CALCIUM 7.6* 7.6* 7.5* 7.9* 7.6* 8.0* 7.7*   CBC Recent Labs  Lab 02/04/19 0456 02/05/19 0512 02/06/19 0443 02/07/19 0415  WBC 7.6 7.7 7.4 7.3  NEUTROABS 5.0 4.9 4.7 4.5  HGB 11.2* 11.2* 11.7* 10.8*  HCT 33.7* 32.5* 33.8* 31.6*  MCV 88.5 85.1 84.9 85.2  PLT 259 265 288 301    Medications:    . benztropine  1 mg Oral Daily   Or  . benztropine mesylate  1 mg Intramuscular Daily  . docusate sodium  100 mg Oral BID  . enoxaparin (LOVENOX) injection  30 mg Subcutaneous Q24H  . haloperidol  5 mg Oral Daily   Or  .  haloperidol lactate  5 mg Intramuscular Daily  . LORazepam  2 mg Oral Daily   Or  . LORazepam  2 mg Intramuscular Daily  . sodium bicarbonate  650 mg Oral BID      Madelon Lips, MD 02/07/2019, 10:48 AM

## 2019-02-08 LAB — BASIC METABOLIC PANEL
Anion gap: 13 (ref 5–15)
BUN: 28 mg/dL — ABNORMAL HIGH (ref 6–20)
CO2: 24 mmol/L (ref 22–32)
Calcium: 8.1 mg/dL — ABNORMAL LOW (ref 8.9–10.3)
Chloride: 105 mmol/L (ref 98–111)
Creatinine, Ser: 8.05 mg/dL — ABNORMAL HIGH (ref 0.61–1.24)
GFR calc Af Amer: 9 mL/min — ABNORMAL LOW (ref >60)
GFR calc non Af Amer: 8 mL/min — ABNORMAL LOW (ref >60)
Glucose, Bld: 99 mg/dL (ref 70–99)
Potassium: 3.3 mmol/L — ABNORMAL LOW (ref 3.5–5.1)
Sodium: 142 mmol/L (ref 135–145)

## 2019-02-08 LAB — MAGNESIUM: Magnesium: 1.8 mg/dL (ref 1.7–2.4)

## 2019-02-08 MED ORDER — POTASSIUM CHLORIDE CRYS ER 20 MEQ PO TBCR
40.0000 meq | EXTENDED_RELEASE_TABLET | Freq: Once | ORAL | Status: AC
  Administered 2019-02-08: 40 meq via ORAL
  Filled 2019-02-08: qty 2

## 2019-02-08 NOTE — Progress Notes (Signed)
PROGRESS NOTE    Tyrone Jenkins  FGH:[id number] DOB: Feb 04, 1981 DOA: 01/27/2019 PCP: Patient, No Pcp Per     Brief Narrative:  Tyrone Jenkins is a 38 y.o. M with schizophrenia who presented with being found wandering. Patient was found walking around on the road on 11/7 and was noted to have abrasions on his R wrist and under chin. He was also found to have a large open wound to his left wrist with uneven edges, partial necrosis, and mild surrounding erythema. Wet to dry dressings were applied. With further evaluation, the patient was noted to have h/o schizophrenia and has spells where he doesn't talk. Over the next few days, the thought was the surrounding erythema from his wounds may have been related to wrist restraints. IVC paperwork was completed and he was planned to go to [facility]. However, prior to the transfer, CT appeared to confirm cellulitis. Hand surgery was consulted. Patient subsequently developed severe renal failure while on vancomycin.  New events last 24 hours / Subjective: No new complaints today  Assessment & Plan:   Principal Problem:   Cellulitis of arm Active Problems:   Schizophrenia, unspecified (HCC)   Necrosis (HCC)   Elevated serum creatinine    AKI  -Creatinine baseline appears to be around 1.1-1.4 -Renal US showed no hydronephrosis -FeNa <1% -Creatinine continues to improve slowly. Nephrology following  Bilateral wrist cellulitis -S/P Vanco, Zosyn, and clindamycin at the time of admission   -Continue ceftriaxone -Dr Arita Miss from plastic surgery was consulted, for debridement 11/20  Schizophrenia -No longer requiring IVC -Psychiatry reevaluated patient on 02/06/2019, DC'd one-to-one sitter -Continue Ativan, Cogentin, Haldol  Hypokalemia -Replace, trend    DVT prophylaxis: Lovenox  Code Status: Full Family Communication: None at bedside Disposition Plan: Pending improvement in Cr, debridement of his left wrist   Consultants:    Plastic surgery  Psychiatry  Nephrology   Antimicrobials:  Anti-infectives (From admission, onward)   Start     Dose/Rate Route Frequency Ordered Stop   02/01/19 1000  cefTRIAXone (ROCEPHIN) 1 g in sodium chloride 0.9 % 100 mL IVPB     1 g 200 mL/hr over 30 Minutes Intravenous Every 24 hours 01/31/19 1210     01/31/19 1300  metroNIDAZOLE (FLAGYL) IVPB 500 mg  Status:  Discontinued     500 mg 100 mL/hr over 60 Minutes Intravenous Every 8 hours 01/31/19 1210 02/01/19 0750   01/31/19 1000  vancomycin (VANCOCIN) 1,250 mg in sodium chloride 0.9 % 250 mL IVPB  Status:  Discontinued     1,250 mg 166.7 mL/hr over 90 Minutes Intravenous Every 12 hours 01/30/19 0053 01/30/19 1058   01/31/19 0941  vancomycin variable dose per unstable renal function (pharmacist dosing)  Status:  Discontinued      Does not apply See admin instructions 01/31/19 0942 01/31/19 1206   01/31/19 0600  vancomycin (VANCOCIN) 2,000 mg in sodium chloride 0.9 % 500 mL IVPB  Status:  Discontinued     2,000 mg 250 mL/hr over 120 Minutes Intravenous Every 24 hours 01/30/19 1058 01/31/19 0941   01/29/19 2115  vancomycin (VANCOCIN) 2,000 mg in sodium chloride 0.9 % 500 mL IVPB  Status:  Discontinued     2,000 mg 250 mL/hr over 120 Minutes Intravenous  Once 01/29/19 2100 01/30/19 0145   01/29/19 2115  clindamycin (CLEOCIN) IVPB 900 mg  Status:  Discontinued     900 mg 100 mL/hr over 30 Minutes Intravenous  Once 01/29/19 2100 01/30/19 0027   01/29/19  2115  piperacillin-tazobactam (ZOSYN) IVPB 3.375 g     3.375 g 100 mL/hr over 30 Minutes Intravenous  Once 01/29/19 2100 01/30/19 0027   01/29/19 2000  cefTRIAXone (ROCEPHIN) injection 250 mg  Status:  Discontinued     250 mg Intramuscular Every 24 hours 01/29/19 1931 01/29/19 2058   01/28/19 1400  cephALEXin (KEFLEX) capsule 500 mg  Status:  Discontinued     500 mg Oral Every 8 hours 01/28/19 1308 01/29/19 1931       Objective: Vitals:   02/07/19 1641 02/07/19 2045  02/07/19 2128 02/08/19 0445  BP: (!) 166/107 (!) 163/101 (!) 170/96 (!) 153/101  Pulse: 77 71 71 73  Resp: 18 18  18   Temp:  98.7 F (37.1 C)  98.7 F (37.1 C)  TempSrc:    Oral  SpO2: 99% 96% 100% 94%  Weight:      Height:        Intake/Output Summary (Last 24 hours) at 02/08/2019 1130 Last data filed at 02/08/2019 0445 Gross per 24 hour  Intake 640 ml  Output 0 ml  Net 640 ml   Filed Weights   02/04/19 1000 02/06/19 0424 02/07/19 0444  Weight: 109 kg 110.6 kg 110 kg    Examination: General exam: Appears calm and comfortable  Respiratory system: Clear to auscultation. Respiratory effort normal. Cardiovascular system: S1 & S2 heard, RRR. No pedal edema. Gastrointestinal system: Abdomen is nondistended, soft and nontender. Normal bowel sounds heard. Central nervous system: Alert and oriented. Non focal exam. Speech clear  Extremities: Symmetric in appearance bilaterally  Skin: Bilateral wrists wrapped in clean and dry dressing Psychiatry: Judgement and insight appear stable. Mood & affect appropriate.    Data Reviewed: I have personally reviewed following labs and imaging studies  CBC: Recent Labs  Lab 02/02/19 0635 02/04/19 0456 02/05/19 0512 02/06/19 0443 02/07/19 0415  WBC 7.4 7.6 7.7 7.4 7.3  NEUTROABS  --  5.0 4.9 4.7 4.5  HGB 11.1* 11.2* 11.2* 11.7* 10.8*  HCT 34.0* 33.7* 32.5* 33.8* 31.6*  MCV 90.7 88.5 85.1 84.9 85.2  PLT 184 259 265 288 301   Basic Metabolic Panel: Recent Labs  Lab 02/04/19 0456 02/05/19 0512 02/06/19 0443 02/07/19 0415 02/08/19 0502  NA 139 138 140 142 142  K 3.4* 3.3* 3.1* 3.2* 3.3*  CL 108 109 105 108 105  CO2 18* 19* 21* 23 24  GLUCOSE 86 100* 97 91 99  BUN 36* 36* 33* 30* 28*  CREATININE 9.14* 9.20* 8.95* 8.61* 8.05*  CALCIUM 7.9* 7.6* 8.0* 7.7* 8.1*  MG  --   --   --   --  1.8   GFR: Estimated Creatinine Clearance: 15.8 mL/min (A) (by C-G formula based on SCr of 8.05 mg/dL (H)). Liver Function Tests: No results  for input(s): AST, ALT, ALKPHOS, BILITOT, PROT, ALBUMIN in the last 168 hours. No results for input(s): LIPASE, AMYLASE in the last 168 hours. No results for input(s): AMMONIA in the last 168 hours. Coagulation Profile: No results for input(s): INR, PROTIME in the last 168 hours. Cardiac Enzymes: No results for input(s): CKTOTAL, CKMB, CKMBINDEX, TROPONINI in the last 168 hours. BNP (last 3 results) No results for input(s): PROBNP in the last 8760 hours. HbA1C: No results for input(s): HGBA1C in the last 72 hours. CBG: No results for input(s): GLUCAP in the last 168 hours. Lipid Profile: No results for input(s): CHOL, HDL, LDLCALC, TRIG, CHOLHDL, LDLDIRECT in the last 72 hours. Thyroid Function Tests: No results for input(s):  TSH, T4TOTAL, FREET4, T3FREE, THYROIDAB in the last 72 hours. Anemia Panel: No results for input(s): VITAMINB12, FOLATE, FERRITIN, TIBC, IRON, RETICCTPCT in the last 72 hours. Sepsis Labs: No results for input(s): PROCALCITON, LATICACIDVEN in the last 168 hours.  Recent Results (from the past 240 hour(s))  Surgical PCR screen     Status: None   Collection Time: 02/07/19  8:25 PM   Specimen: Nasal Mucosa; Nasal Swab  Result Value Ref Range Status   MRSA, PCR NEGATIVE NEGATIVE Final   Staphylococcus aureus NEGATIVE NEGATIVE Final    Comment: (NOTE) The Xpert SA Assay (FDA approved for NASAL specimens in patients 12 years of age and older), is one component of a comprehensive surveillance program. It is not intended to diagnose infection nor to guide or monitor treatment. Performed at [facility], [place] [address], [place], [place] [address]       Radiology Studies: No results found.    Scheduled Meds: . benztropine  1 mg Oral Daily   Or  . benztropine mesylate  1 mg Intramuscular Daily  . docusate sodium  100 mg Oral BID  . enoxaparin (LOVENOX) injection  30 mg Subcutaneous Q24H  . haloperidol  5 mg Oral Daily   Or  . haloperidol lactate   5 mg Intramuscular Daily  . LORazepam  2 mg Oral Daily   Or  . LORazepam  2 mg Intramuscular Daily  . potassium chloride  40 mEq Oral Once  . sodium bicarbonate  650 mg Oral BID   Continuous Infusions: . cefTRIAXone (ROCEPHIN)  IV 1 g (02/07/19 1035)     LOS: 9 days      Time spent: 25 minutes   Dessa Phi, DO [facility] 02/08/2019, 11:30 AM   Available via Epic secure chat 7am-7pm After these hours, please refer to coverage provider listed on [email or web address]

## 2019-02-08 NOTE — Evaluation (Signed)
Occupational Therapy Evaluation Patient Details Name: Tyrone Jenkins MRN: [id number] DOB: 10/30/1980 Today's Date: 02/08/2019    History of Present Illness 37yo male found walking at the edge of the road on 11/7 with B wrist wounds and chin abrasion, partial necrosis on L wrist wound. Possible cellulitis per CT. Admitted for B UE cellulitis, schizophrenia. PMH schizophrenia   Clinical Impression   Patient is a 38 year old male that lives with his parents in a condo with level entry. Patient demonstrates independence with self care, functional mobility/transfers this session, is at his baseline. Will discontinue acute OT services at this time.    Follow Up Recommendations  No OT follow up    Equipment Recommendations  None recommended by OT       Precautions / Restrictions Precautions Precautions: None Restrictions Weight Bearing Restrictions: No      Mobility Bed Mobility Overal bed mobility: Independent                Transfers Overall transfer level: Independent                    Balance Overall balance assessment: Independent                                         ADL either performed or assessed with clinical judgement   ADL Overall ADL's : Independent                                       General ADL Comments: patient complete LB dressing, functional transfers/ambulation with no assist/deficits noted.                   Pertinent Vitals/Pain Pain Assessment: No/denies pain     Hand Dominance Right   Extremity/Trunk Assessment Upper Extremity Assessment Upper Extremity Assessment: Overall WFL for tasks assessed   Lower Extremity Assessment Lower Extremity Assessment: Defer to PT evaluation   Cervical / Trunk Assessment Cervical / Trunk Assessment: Normal   Communication Communication Communication: No difficulties   Cognition Arousal/Alertness: Awake/alert Behavior During Therapy: WFL for  tasks assessed/performed Overall Cognitive Status: Within Functional Limits for tasks assessed                                 General Comments: very calm and cooperative with therapy              Home Living Family/patient expects to be discharged to:: Private residence Living Arrangements: Parent Available Help at Discharge: Family;Available 24 hours/day Type of Home: House Home Access: Ramped entrance     Home Layout: One level     Bathroom Shower/Tub: Chief Strategy Officer: ("chair height") Bathroom Accessibility: Yes   Home Equipment: Grab bars - tub/shower   Additional Comments: engaged to be married      Prior Functioning/Environment Level of Independence: Independent                 OT Problem List: Impaired balance (sitting and/or standing)       AM-PAC OT "6 Clicks" Daily Activity     Outcome Measure Help from another person eating meals?: None Help from another person taking care of personal grooming?: None Help from another person  toileting, which includes using toliet, bedpan, or urinal?: None Help from another person bathing (including washing, rinsing, drying)?: None Help from another person to put on and taking off regular upper body clothing?: None Help from another person to put on and taking off regular lower body clothing?: None 6 Click Score: 24   End of Session    Activity Tolerance: Patient tolerated treatment well Patient left: in bed;with call bell/phone within reach  OT Visit Diagnosis: Unsteadiness on feet (R26.81)                Time: 4825-0037 OT Time Calculation (min): 16 min Charges:  OT General Charges $OT Visit: 1 Visit OT Evaluation $OT Eval Low Complexity: 1 Low  Shon Millet OT OT office: [place] 02/08/2019, 10:00 AM

## 2019-02-08 NOTE — Progress Notes (Signed)
  [facility] Progress Note    Assessment/ Plan:   1. AKI - severe, rapid rise creat. 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 Looks like Cr is finally downtrending with increase in UOP, peaked at 9.2.  Now on oral bicarb.  No indication for RRT.  Will continue to follow.  Cr 8.0 and downtrending.    2. Schizophrenia  3. Bilat wrist wounds/ cellulitis - Rocephin- plastics has seen, to OR tomorrow possibly- no direct renal contraindication to OR but would avoid intra-op hypotension as this could prolong AKI recovery- would target MAP 80.    4. Dispo: had a sitter  Subjective:    Feeling better today.  Possibly to OR tomorrow.     Objective:   BP (!) 153/101 (BP Location: Right Arm)   Pulse 73   Temp 98.7 F (37.1 C) (Oral)   Resp 18   Ht 6' 0.01" (1.829 m)   Wt 110 kg   SpO2 94%   BMI 32.88 kg/m   Intake/Output Summary (Last 24 hours) at 02/08/2019 1122 Last data filed at 02/08/2019 0445 Gross per 24 hour  Intake 640 ml  Output 0 ml  Net 640 ml   Weight change:   Physical Exam: WER:[id number], lying in bed, NAD CVS: RRR no m/r/g Resp: normal WOB on RA Abd: soft, nontender, NABS Ext:1+ LE edema, wrists bandaged  Imaging: No results found.  Labs: BMET Recent Labs  Lab 02/02/19 0635 02/03/19 1031 02/04/19 0456 02/05/19 0512 02/06/19 0443 02/07/19 0415 02/08/19 0502  NA 139 138 139 138 140 142 142  K 3.5 3.6 3.4* 3.3* 3.1* 3.2* 3.3*  CL 110 111 108 109 105 108 105  CO2 17* 17* 18* 19* 21* 23 24  GLUCOSE 90 98 86 100* 97 91 99  BUN 40* 38* 36* 36* 33* 30* 28*  CREATININE 8.05* 8.88* 9.14* 9.20* 8.95* 8.61* 8.05*  CALCIUM 7.6* 7.5* 7.9* 7.6* 8.0* 7.7* 8.1*   CBC Recent Labs  Lab 02/04/19 0456 02/05/19 0512 02/06/19 0443 02/07/19 0415  WBC 7.6 7.7 7.4 7.3  NEUTROABS 5.0 4.9 4.7 4.5  HGB 11.2* 11.2* 11.7* 10.8*  HCT 33.7* 32.5* 33.8* 31.6*  MCV 88.5 85.1 84.9 85.2  PLT 259 265 288 301    Medications:    . benztropine  1 mg Oral Daily   Or  . benztropine mesylate  1 mg Intramuscular Daily  . docusate sodium  100 mg Oral BID  . enoxaparin (LOVENOX) injection  30 mg Subcutaneous Q24H  . haloperidol  5 mg Oral Daily   Or  . haloperidol lactate  5 mg Intramuscular Daily  . LORazepam  2 mg Oral Daily   Or  . LORazepam  2 mg Intramuscular Daily  . potassium chloride  40 mEq Oral Once  . sodium bicarbonate  650 mg Oral BID      Madelon Lips, MD 02/08/2019, 11:22 AM

## 2019-02-09 ENCOUNTER — Encounter (HOSPITAL_COMMUNITY)
Admission: EM | Disposition: A | Discharge status: Home / Self Care | Payer: Self-pay | Source: Home / Self Care | Attending: Internal Medicine

## 2019-02-09 ENCOUNTER — Inpatient Hospital Stay (HOSPITAL_COMMUNITY): Payer: 59 | Admitting: Anesthesiology

## 2019-02-09 DIAGNOSIS — L03114 Cellulitis of left upper limb: Secondary | ICD-10-CM

## 2019-02-09 HISTORY — PX: INCISION AND DRAINAGE OF WOUND: SHX1803

## 2019-02-09 LAB — BASIC METABOLIC PANEL
Anion gap: 14 (ref 5–15)
BUN: 27 mg/dL — ABNORMAL HIGH (ref 6–20)
CO2: 23 mmol/L (ref 22–32)
Calcium: 7.8 mg/dL — ABNORMAL LOW (ref 8.9–10.3)
Chloride: 104 mmol/L (ref 98–111)
Creatinine, Ser: 7.56 mg/dL — ABNORMAL HIGH (ref 0.61–1.24)
GFR calc Af Amer: 9 mL/min — ABNORMAL LOW (ref >60)
GFR calc non Af Amer: 8 mL/min — ABNORMAL LOW (ref >60)
Glucose, Bld: 93 mg/dL (ref 70–99)
Potassium: 3.5 mmol/L (ref 3.5–5.1)
Sodium: 141 mmol/L (ref 135–145)

## 2019-02-09 SURGERY — IRRIGATION AND DEBRIDEMENT WOUND
Anesthesia: General | Site: Wrist | Laterality: Left

## 2019-02-09 MED ORDER — SODIUM CHLORIDE 0.9 % IV SOLN
INTRAVENOUS | Status: DC
  Administered 2019-02-09 (×3): via INTRAVENOUS

## 2019-02-09 MED ORDER — ONDANSETRON HCL 4 MG/2ML IJ SOLN
INTRAMUSCULAR | Status: AC
  Filled 2019-02-09: qty 4

## 2019-02-09 MED ORDER — MEPERIDINE HCL 25 MG/ML IJ SOLN: 6.2500 mg | INTRAMUSCULAR | Status: DC | PRN

## 2019-02-09 MED ORDER — BUPIVACAINE-EPINEPHRINE 0.5% -1:200000 IJ SOLN
INTRAMUSCULAR | Status: AC
  Filled 2019-02-09: qty 1

## 2019-02-09 MED ORDER — SODIUM CHLORIDE 0.9 % IR SOLN
Status: DC | PRN
  Administered 2019-02-09: 3000 mL

## 2019-02-09 MED ORDER — PROPOFOL 10 MG/ML IV BOLUS
INTRAVENOUS | Status: AC
  Filled 2019-02-09: qty 20

## 2019-02-09 MED ORDER — DEXAMETHASONE SODIUM PHOSPHATE 10 MG/ML IJ SOLN
INTRAMUSCULAR | Status: AC
  Filled 2019-02-09: qty 2

## 2019-02-09 MED ORDER — ROCURONIUM BROMIDE 10 MG/ML (PF) SYRINGE
PREFILLED_SYRINGE | INTRAVENOUS | Status: AC
  Filled 2019-02-09: qty 10

## 2019-02-09 MED ORDER — LIDOCAINE 2% (20 MG/ML) 5 ML SYRINGE
INTRAMUSCULAR | Status: AC
  Filled 2019-02-09: qty 5

## 2019-02-09 MED ORDER — BUPIVACAINE-EPINEPHRINE 0.5% -1:200000 IJ SOLN
INTRAMUSCULAR | Status: DC | PRN
  Administered 2019-02-09: 25 mL

## 2019-02-09 MED ORDER — PROPOFOL 10 MG/ML IV BOLUS
INTRAVENOUS | Status: DC | PRN
  Administered 2019-02-09: 200 mg via INTRAVENOUS
  Administered 2019-02-09: 20 mg via INTRAVENOUS

## 2019-02-09 MED ORDER — PROMETHAZINE HCL 25 MG/ML IJ SOLN: 6.2500 mg | INTRAMUSCULAR | Status: DC | PRN

## 2019-02-09 MED ORDER — HYDROMORPHONE HCL 1 MG/ML IJ SOLN: 0.2500 mg | INTRAMUSCULAR | Status: DC | PRN

## 2019-02-09 MED ORDER — MIDAZOLAM HCL 2 MG/2ML IJ SOLN
INTRAMUSCULAR | Status: AC
  Filled 2019-02-09: qty 2

## 2019-02-09 MED ORDER — 0.9 % SODIUM CHLORIDE (POUR BTL) OPTIME
TOPICAL | Status: DC | PRN
  Administered 2019-02-09: 1000 mL

## 2019-02-09 MED ORDER — FENTANYL CITRATE (PF) 100 MCG/2ML IJ SOLN
INTRAMUSCULAR | Status: DC | PRN
  Administered 2019-02-09: 25 ug via INTRAVENOUS
  Administered 2019-02-09: 100 ug via INTRAVENOUS
  Administered 2019-02-09: 50 ug via INTRAVENOUS
  Administered 2019-02-09: 25 ug via INTRAVENOUS

## 2019-02-09 MED ORDER — ONDANSETRON HCL 4 MG/2ML IJ SOLN
INTRAMUSCULAR | Status: DC | PRN
  Administered 2019-02-09: 4 mg via INTRAVENOUS

## 2019-02-09 MED ORDER — MIDAZOLAM HCL 5 MG/5ML IJ SOLN
INTRAMUSCULAR | Status: DC | PRN
  Administered 2019-02-09: 2 mg via INTRAVENOUS

## 2019-02-09 MED ORDER — PHENYLEPHRINE 40 MCG/ML (10ML) SYRINGE FOR IV PUSH (FOR BLOOD PRESSURE SUPPORT)
PREFILLED_SYRINGE | INTRAVENOUS | Status: AC
  Filled 2019-02-09: qty 10

## 2019-02-09 MED ORDER — LIDOCAINE 2% (20 MG/ML) 5 ML SYRINGE
INTRAMUSCULAR | Status: DC | PRN
  Administered 2019-02-09: 100 mg via INTRAVENOUS

## 2019-02-09 MED ORDER — FENTANYL CITRATE (PF) 250 MCG/5ML IJ SOLN
INTRAMUSCULAR | Status: AC
  Filled 2019-02-09: qty 5

## 2019-02-09 SURGICAL SUPPLY — 46 items
BLADE CLIPPER SURG (BLADE) IMPLANT
BNDG ELASTIC 4X5.8 VLCR STR LF (GAUZE/BANDAGES/DRESSINGS) ×2 IMPLANT
BNDG GAUZE ELAST 4 BULKY (GAUZE/BANDAGES/DRESSINGS) ×2 IMPLANT
CANISTER SUCT 3000ML PPV (MISCELLANEOUS) ×3 IMPLANT
CHLORAPREP W/TINT 26 (MISCELLANEOUS) IMPLANT
COVER SURGICAL LIGHT HANDLE (MISCELLANEOUS) ×3 IMPLANT
COVER WAND RF STERILE (DRAPES) ×3 IMPLANT
DRAPE HALF SHEET 40X57 (DRAPES) IMPLANT
DRAPE INCISE IOBAN 66X45 STRL (DRAPES) IMPLANT
DRAPE ORTHO SPLIT 77X108 STRL (DRAPES)
DRAPE SURG ORHT 6 SPLT 77X108 (DRAPES) IMPLANT
DRESSING HYDROCOLLOID 4X4 (GAUZE/BANDAGES/DRESSINGS) ×3 IMPLANT
DRSG ADAPTIC 3X8 NADH LF (GAUZE/BANDAGES/DRESSINGS) ×2 IMPLANT
DRSG CUTIMED SORBACT 7X9 (GAUZE/BANDAGES/DRESSINGS) ×2 IMPLANT
DRSG PAD ABDOMINAL 8X10 ST (GAUZE/BANDAGES/DRESSINGS) IMPLANT
DRSG VAC ATS LRG SENSATRAC (GAUZE/BANDAGES/DRESSINGS) IMPLANT
DRSG VAC ATS MED SENSATRAC (GAUZE/BANDAGES/DRESSINGS) IMPLANT
DRSG VAC ATS SM SENSATRAC (GAUZE/BANDAGES/DRESSINGS) IMPLANT
ELECT REM PT RETURN 9FT ADLT (ELECTROSURGICAL) ×3
ELECTRODE REM PT RTRN 9FT ADLT (ELECTROSURGICAL) ×1 IMPLANT
GAUZE SPONGE 4X4 12PLY STRL (GAUZE/BANDAGES/DRESSINGS) IMPLANT
GAUZE SPONGE 4X4 12PLY STRL LF (GAUZE/BANDAGES/DRESSINGS) ×2 IMPLANT
GEL ULTRASOUND 20GR AQUASONIC (MISCELLANEOUS) IMPLANT
GLOVE BIO SURGEON STRL SZ 6.5 (GLOVE) ×4 IMPLANT
GLOVE BIO SURGEONS STRL SZ 6.5 (GLOVE) ×2
GOWN STRL REUS W/ TWL LRG LVL3 (GOWN DISPOSABLE) ×2 IMPLANT
GOWN STRL REUS W/TWL LRG LVL3 (GOWN DISPOSABLE) ×6
HANDPIECE INTERPULSE COAX TIP (DISPOSABLE)
KIT BASIN OR (CUSTOM PROCEDURE TRAY) ×3 IMPLANT
KIT TURNOVER KIT B (KITS) ×3 IMPLANT
MATRIX WOUND 3-LAYER 5X5 (Tissue) ×1 IMPLANT
MICROMATRIX 500MG (Tissue) ×3 IMPLANT
NS IRRIG 1000ML POUR BTL (IV SOLUTION) ×3 IMPLANT
PACK GENERAL/GYN (CUSTOM PROCEDURE TRAY) ×3 IMPLANT
PAD ARMBOARD 7.5X6 YLW CONV (MISCELLANEOUS) ×6 IMPLANT
PAD NEG PRESSURE SENSATRAC (MISCELLANEOUS) IMPLANT
SET CYSTO W/LG BORE CLAMP LF (SET/KITS/TRAYS/PACK) ×2 IMPLANT
SET HNDPC FAN SPRY TIP SCT (DISPOSABLE) IMPLANT
SOLUTION PARTIC MCRMTRX 500MG (Tissue) IMPLANT
SUT SILK 4 0 PS 2 (SUTURE) IMPLANT
SUT VIC AB 4-0 PS2 18 (SUTURE) ×4 IMPLANT
SUT VIC AB 5-0 PS2 18 (SUTURE) IMPLANT
TOWEL GREEN STERILE (TOWEL DISPOSABLE) ×3 IMPLANT
TOWEL GREEN STERILE FF (TOWEL DISPOSABLE) IMPLANT
UNDERPAD 30X30 (UNDERPADS AND DIAPERS) IMPLANT
WOUND MATRIX 3-LAYER 5X5 (Tissue) ×1 IMPLANT

## 2019-02-09 NOTE — Op Note (Signed)
Operative Note   DATE OF OPERATION: 02/09/2019  SURGICAL DEPARTMENT: Plastic Surgery  PREOPERATIVE DIAGNOSES: Left wrist wound  POSTOPERATIVE DIAGNOSES:  same  PROCEDURE: 1.  Debridement of left wrist wound totaling 5 x 5 cm including skin subcutaneous tissue and fascia. 2.  Placement of ACell powder and 5 x 5cm 3 layer Cytal sheet  SURGEON: Talmadge Coventry, MD  ASSISTANT: None  ANESTHESIA:  General.   COMPLICATIONS: None.   INDICATIONS FOR PROCEDURE:  The patient, Tyrone Jenkins is a 38 y.o. male born on [birth year], is here for treatment of left wrist wound. MRN: [id number]  CONSENT:  Informed consent was obtained directly from the patient. Risks, benefits and alternatives were fully discussed. Specific risks including but not limited to bleeding, infection, hematoma, seroma, scarring, pain, contracture, asymmetry, wound healing problems, and need for further surgery were all discussed. The patient did have an ample opportunity to have questions answered to satisfaction.   DESCRIPTION OF PROCEDURE:  The patient was taken to the operating room. SCDs were placed and no antibiotics were given.  General anesthesia was administered.  The patient's operative site was prepped and draped in a sterile fashion. A time out was performed and all information was confirmed to be correct.  I started by injecting half percent Marcaine circumferentially around the wound.  The wound was then debrided with a 15 blade.  I excised skin edges around the edge and removed all the necrotic tissue.  The debridement included skin, subcutaneous tissue, and fascia.  Hemostasis was then obtained with bipolar.  The wound was then irrigated with a liter and half of normal saline.  Hemostasis was then ensured again.  500 mg of micro matrix ACell powder was then applied.  This was followed by 5 x 5 cm Cytal sheet.  The sheet was secured with 5-0 Vicryl suture.  A sore back dressing was then applied and secured  with Vicryl suture.  Was dressed with K-Y jelly 4 x 4's and a soft wrap.  The patient tolerated the procedure well.  There were no complications. The patient was allowed to wake from anesthesia, extubated and taken to the recovery room in satisfactory condition.

## 2019-02-09 NOTE — Brief Op Note (Signed)
02/09/2019  11:40 AM  PATIENT:  Tyrone Jenkins  38 y.o. male  PRE-OPERATIVE DIAGNOSIS:  bilateral wrist wounds  POST-OPERATIVE DIAGNOSIS:  bilateral wrist wounds  PROCEDURE:  Procedure(s) with comments: debridement of left wrist wound with Acell placement (Left) - 1 hour  SURGEON:  Surgeon(s) and Role:    * Bertine Schlottman, Steffanie Dunn, MD - Primary  PHYSICIAN ASSISTANT: None  ASSISTANTS: none   ANESTHESIA:   general  EBL:  25 mL   BLOOD ADMINISTERED:none  DRAINS: none   LOCAL MEDICATIONS USED:  BUPIVICAINE   SPECIMEN:  No Specimen  DISPOSITION OF SPECIMEN:  N/A  COUNTS:  YES  TOURNIQUET:  * No tourniquets in log *  DICTATION: .Dragon Dictation  PLAN OF CARE: Admit to inpatient   PATIENT DISPOSITION:  PACU - hemodynamically stable.   Delay start of Pharmacological VTE agent (>24hrs) due to surgical blood loss or risk of bleeding: not applicable

## 2019-02-09 NOTE — Progress Notes (Signed)
  [facility] Progress Note    Assessment/ Plan:   1. AKI - severe, rapid rise creat. 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 Looks like Cr is finally downtrending with increase in UOP, peaked at 9.2.  Now on oral bicarb.  No indication for RRT.  Will continue to follow.  Cr 8.0--> 7.5 today.    2. Schizophrenia 3. Bilat wrist wounds/ cellulitis - Rocephin- s/p debridement in OR with plastics today 11/20.   4. Dispo: if Cr still downtrending after OR tomorrow OK to d/c from renal perspective.   Subjective:    S/p debridement in OR of L wrist.  Did well, no pain today reported.   Objective:   BP (!) 157/98 (BP Location: Right Arm)   Pulse 90   Temp 98.2 F (36.8 C)   Resp 18   Ht 6' 0.01" (1.829 m)   Wt 110 kg   SpO2 (!) 89%   BMI 32.88 kg/m   Intake/Output Summary (Last 24 hours) at 02/09/2019 1552 Last data filed at 02/09/2019 1300 Gross per 24 hour  Intake 1417 ml  Output 25 ml  Net 1392 ml   Weight change:   Physical Exam: [id number], lying in bed, NAD CVS: RRR no m/r/g Resp: normal WOB on RA Abd: soft, nontender, NABS FKC:LEXNT LE edema, L wrist sterile dressing  Imaging: No results found.  Labs: BMET Recent Labs  Lab 02/03/19 1031 02/04/19 0456 02/05/19 0512 02/06/19 0443 02/07/19 0415 02/08/19 0502 02/09/19 0556  NA 138 139 138 140 142 142 141  K 3.6 3.4* 3.3* 3.1* 3.2* 3.3* 3.5  CL 111 108 109 105 108 105 104  CO2 17* 18* 19* 21* 23 24 23   GLUCOSE 98 86 100* 97 91 99 93  BUN 38* 36* 36* 33* 30* 28* 27*  CREATININE 8.88* 9.14* 9.20* 8.95* 8.61* 8.05* 7.56*  CALCIUM 7.5* 7.9* 7.6* 8.0* 7.7* 8.1* 7.8*   CBC Recent Labs  Lab 02/04/19 0456 02/05/19 0512 02/06/19 0443 02/07/19 0415  WBC 7.6 7.7 7.4 7.3  NEUTROABS 5.0 4.9 4.7 4.5  HGB 11.2* 11.2* 11.7* 10.8*  HCT 33.7* 32.5* 33.8* 31.6*  MCV 88.5 85.1 84.9 85.2  PLT 259 265 288 301    Medications:    . benztropine  1 mg Oral Daily   Or  . benztropine mesylate  1 mg  Intramuscular Daily  . docusate sodium  100 mg Oral BID  . enoxaparin (LOVENOX) injection  30 mg Subcutaneous Q24H  . haloperidol  5 mg Oral Daily   Or  . haloperidol lactate  5 mg Intramuscular Daily  . LORazepam  2 mg Oral Daily   Or  . LORazepam  2 mg Intramuscular Daily  . sodium bicarbonate  650 mg Oral BID      Madelon Lips, MD 02/09/2019, 3:52 PM

## 2019-02-09 NOTE — Anesthesia Preprocedure Evaluation (Signed)
Anesthesia Evaluation  Patient identified by MRN, date of birth, ID band Patient awake    Reviewed: Allergy & Precautions, NPO status , Patient's Chart, lab work & pertinent test results  Airway Mallampati: II  TM Distance: >3 FB Neck ROM: Full    Dental  (+) Dental Advisory Given   Pulmonary neg pulmonary ROS,    Pulmonary exam normal breath sounds clear to auscultation       Cardiovascular negative cardio ROS Normal cardiovascular exam Rhythm:Regular Rate:Normal     Neuro/Psych negative neurological ROS  negative psych ROS   GI/Hepatic negative GI ROS, Neg liver ROS,   Endo/Other  negative endocrine ROS  Renal/GU negative Renal ROS     Musculoskeletal negative musculoskeletal ROS (+)   Abdominal   Peds  Hematology negative hematology ROS (+)   Anesthesia Other Findings   Reproductive/Obstetrics                             Anesthesia Physical Anesthesia Plan  ASA: II  Anesthesia Plan: General   Post-op Pain Management:    Induction: Intravenous  PONV Risk Score and Plan: 3 and Ondansetron, Dexamethasone, Treatment may vary due to age or medical condition and Midazolam  Airway Management Planned: LMA  Additional Equipment: None  Intra-op Plan:   Post-operative Plan: Extubation in OR  Informed Consent: I have reviewed the patients History and Physical, chart, labs and discussed the procedure including the risks, benefits and alternatives for the proposed anesthesia with the patient or authorized representative who has indicated his/her understanding and acceptance.     Dental advisory given  Plan Discussed with: CRNA  Anesthesia Plan Comments:         Anesthesia Quick Evaluation

## 2019-02-09 NOTE — Plan of Care (Signed)
  Problem: Health Behavior/Discharge Planning: Goal: Ability to manage health-related needs will improve Outcome: Progressing   

## 2019-02-09 NOTE — Transfer of Care (Signed)
Immediate Anesthesia Transfer of Care Note  Patient: Tyrone Jenkins  Procedure(s) Performed: debridement of left wrist wound with Acell placement (Left Wrist)  Patient Location: PACU  Anesthesia Type:General  Level of Consciousness: awake, alert  and oriented  Airway & Oxygen Therapy: Patient Spontanous Breathing and Patient connected to nasal cannula oxygen  Post-op Assessment: Report given to RN, Post -op Vital signs reviewed and stable and Patient moving all extremities X 4  Post vital signs: Reviewed and stable  Last Vitals:  Vitals Value Taken Time  BP 143/94 02/09/19 1147  Temp    Pulse    Resp 15 02/09/19 1147  SpO2    Vitals shown include unvalidated device data.  Last Pain:  Vitals:   02/09/19 0956  TempSrc:   PainSc: 0-No pain         Complications: No apparent anesthesia complications

## 2019-02-09 NOTE — Anesthesia Procedure Notes (Signed)
Procedure Name: LMA Insertion Date/Time: 02/09/2019 10:42 AM Performed by: Neldon Newport, CRNA Pre-anesthesia Checklist: Timeout performed, Patient being monitored, Suction available, Emergency Drugs available and Patient identified Patient Re-evaluated:Patient Re-evaluated prior to induction Oxygen Delivery Method: Circle system utilized Preoxygenation: Pre-oxygenation with 100% oxygen Induction Type: IV induction Ventilation: Mask ventilation without difficulty LMA: LMA inserted LMA Size: 5.0 Number of attempts: 1 Placement Confirmation: positive ETCO2 and breath sounds checked- equal and bilateral Tube secured with: Tape Dental Injury: Teeth and Oropharynx as per pre-operative assessment

## 2019-02-09 NOTE — Interval H&P Note (Signed)
History and Physical Interval Note:  02/09/2019 10:19 AM  Tyrone Jenkins  has presented today for surgery, with the diagnosis of bilateral wrist wounds.  The various methods of treatment have been discussed with the patient and family. After consideration of risks, benefits and other options for treatment, the patient has consented to  Procedure(s) with comments: debridement of left wrist wound with Acell placement (Left) - 1 hour as a surgical intervention.  The patient's history has been reviewed, patient examined, no change in status, stable for surgery.  I have reviewed the patient's chart and labs.  Questions were answered to the patient's satisfaction.     Cindra Presume

## 2019-02-09 NOTE — Progress Notes (Signed)
PROGRESS NOTE    Tyrone Jenkins  JXB:[id number] DOB: 1980/07/25 DOA: 01/27/2019 PCP: Patient, No Pcp Per     Brief Narrative:  Tyrone Jenkins is a 38 y.o. M with schizophrenia who presented with being found wandering. Patient was found walking around on the road on 11/7 and was noted to have abrasions on his R wrist and under chin. He was also found to have a large open wound to his left wrist with uneven edges, partial necrosis, and mild surrounding erythema. Wet to dry dressings were applied. With further evaluation, the patient was noted to have h/o schizophrenia and has spells where he doesn't talk. Over the next few days, the thought was the surrounding erythema from his wounds may have been related to wrist restraints. IVC paperwork was completed and he was planned to go to [facility]. However, prior to the transfer, CT appeared to confirm cellulitis. Hand surgery was consulted. Patient subsequently developed severe renal failure while on vancomycin.  New events last 24 hours / Subjective: States he feels ready for surgery planned for today. A little bit anxious   Assessment & Plan:   Principal Problem:   Cellulitis of arm Active Problems:   Schizophrenia, unspecified (HCC)   Necrosis (HCC)   Elevated serum creatinine    AKI  -Creatinine baseline appears to be around 1.1-1.4 -Renal US showed no hydronephrosis -FeNa <1% -Creatinine continues to improve slowly. Nephrology following  Bilateral wrist cellulitis -S/P Vanco, Zosyn, and clindamycin at the time of admission   -Continue ceftriaxone -Dr Arita MissPace from plastic surgery was consulted, for debridement 11/20  Schizophrenia -No longer requiring IVC -Psychiatry reevaluated patient on 02/06/2019, DC'd one-to-one sitter -Continue Ativan, Cogentin, Haldol    DVT prophylaxis: Lovenox  Code Status: Full Family Communication: None at bedside Disposition Plan: Pending improvement in Cr, debridement of his left wrist  today. Plan to return home once cleared from plastic surgery and nephrology stand point    Consultants:   Plastic surgery  Psychiatry  Nephrology   Antimicrobials:  Anti-infectives (From admission, onward)   Start     Dose/Rate Route Frequency Ordered Stop   02/01/19 1000  [MAR Hold]  cefTRIAXone (ROCEPHIN) 1 g in sodium chloride 0.9 % 100 mL IVPB     (MAR Hold since Fri 02/09/2019 at 0955.Hold Reason: Transfer to a Procedural area.)   1 g 200 mL/hr over 30 Minutes Intravenous Every 24 hours 01/31/19 1210     01/31/19 1300  metroNIDAZOLE (FLAGYL) IVPB 500 mg  Status:  Discontinued     500 mg 100 mL/hr over 60 Minutes Intravenous Every 8 hours 01/31/19 1210 02/01/19 0750   01/31/19 1000  vancomycin (VANCOCIN) 1,250 mg in sodium chloride 0.9 % 250 mL IVPB  Status:  Discontinued     1,250 mg 166.7 mL/hr over 90 Minutes Intravenous Every 12 hours 01/30/19 0053 01/30/19 1058   01/31/19 0941  vancomycin variable dose per unstable renal function (pharmacist dosing)  Status:  Discontinued      Does not apply See admin instructions 01/31/19 0942 01/31/19 1206   01/31/19 0600  vancomycin (VANCOCIN) 2,000 mg in sodium chloride 0.9 % 500 mL IVPB  Status:  Discontinued     2,000 mg 250 mL/hr over 120 Minutes Intravenous Every 24 hours 01/30/19 1058 01/31/19 0941   01/29/19 2115  vancomycin (VANCOCIN) 2,000 mg in sodium chloride 0.9 % 500 mL IVPB  Status:  Discontinued     2,000 mg 250 mL/hr over 120 Minutes Intravenous  Once  01/29/19 2100 01/30/19 0145   01/29/19 2115  clindamycin (CLEOCIN) IVPB 900 mg  Status:  Discontinued     900 mg 100 mL/hr over 30 Minutes Intravenous  Once 01/29/19 2100 01/30/19 0027   01/29/19 2115  piperacillin-tazobactam (ZOSYN) IVPB 3.375 g     3.375 g 100 mL/hr over 30 Minutes Intravenous  Once 01/29/19 2100 01/30/19 0027   01/29/19 2000  cefTRIAXone (ROCEPHIN) injection 250 mg  Status:  Discontinued     250 mg Intramuscular Every 24 hours 01/29/19 1931 01/29/19  2058   01/28/19 1400  cephALEXin (KEFLEX) capsule 500 mg  Status:  Discontinued     500 mg Oral Every 8 hours 01/28/19 1308 01/29/19 1931       Objective: Vitals:   02/08/19 1615 02/08/19 2027 02/09/19 0505 02/09/19 0837  BP: (!) 145/101 (!) 147/105 (!) 146/104 (!) 156/101  Pulse: 83 91 92 78  Resp: 18 18 18 18   Temp: 97.9 F (36.6 C) 98.4 F (36.9 C) 98.2 F (36.8 C) 98.2 F (36.8 C)  TempSrc: Oral     SpO2: 98% 94% 93% 94%  Weight:      Height:        Intake/Output Summary (Last 24 hours) at 02/09/2019 1028 Last data filed at 02/09/2019 0600 Gross per 24 hour  Intake 560 ml  Output 0 ml  Net 560 ml   Filed Weights   02/04/19 1000 02/06/19 0424 02/07/19 0444  Weight: 109 kg 110.6 kg 110 kg    Examination: General exam: Appears calm and comfortable  Respiratory system: Clear to auscultation. Respiratory effort normal. Cardiovascular system: S1 & S2 heard, RRR. No pedal edema. Gastrointestinal system: Abdomen is nondistended, soft and nontender. Normal bowel sounds heard. Central nervous system: Alert and oriented. Non focal exam. Speech clear  Extremities: Symmetric in appearance bilaterally  Skin: Bilateral wrists covered in clean and dry dressing  Psychiatry: Judgement and insight appear stable. Mood & affect appropriate.    Data Reviewed: I have personally reviewed following labs and imaging studies  CBC: Recent Labs  Lab 02/04/19 0456 02/05/19 0512 02/06/19 0443 02/07/19 0415  WBC 7.6 7.7 7.4 7.3  NEUTROABS 5.0 4.9 4.7 4.5  HGB 11.2* 11.2* 11.7* 10.8*  HCT 33.7* 32.5* 33.8* 31.6*  MCV 88.5 85.1 84.9 85.2  PLT 259 265 288 301   Basic Metabolic Panel: Recent Labs  Lab 02/05/19 0512 02/06/19 0443 02/07/19 0415 02/08/19 0502 02/09/19 0556  NA 138 140 142 142 141  K 3.3* 3.1* 3.2* 3.3* 3.5  CL 109 105 108 105 104  CO2 19* 21* 23 24 23   GLUCOSE 100* 97 91 99 93  BUN 36* 33* 30* 28* 27*  CREATININE 9.20* 8.95* 8.61* 8.05* 7.56*  CALCIUM 7.6*  8.0* 7.7* 8.1* 7.8*  MG  --   --   --  1.8  --    GFR: Estimated Creatinine Clearance: 16.8 mL/min (A) (by C-G formula based on SCr of 7.56 mg/dL (H)). Liver Function Tests: No results for input(s): AST, ALT, ALKPHOS, BILITOT, PROT, ALBUMIN in the last 168 hours. No results for input(s): LIPASE, AMYLASE in the last 168 hours. No results for input(s): AMMONIA in the last 168 hours. Coagulation Profile: No results for input(s): INR, PROTIME in the last 168 hours. Cardiac Enzymes: No results for input(s): CKTOTAL, CKMB, CKMBINDEX, TROPONINI in the last 168 hours. BNP (last 3 results) No results for input(s): PROBNP in the last 8760 hours. HbA1C: No results for input(s): HGBA1C in the last 72  hours. CBG: No results for input(s): GLUCAP in the last 168 hours. Lipid Profile: No results for input(s): CHOL, HDL, LDLCALC, TRIG, CHOLHDL, LDLDIRECT in the last 72 hours. Thyroid Function Tests: No results for input(s): TSH, T4TOTAL, FREET4, T3FREE, THYROIDAB in the last 72 hours. Anemia Panel: No results for input(s): VITAMINB12, FOLATE, FERRITIN, TIBC, IRON, RETICCTPCT in the last 72 hours. Sepsis Labs: No results for input(s): PROCALCITON, LATICACIDVEN in the last 168 hours.  Recent Results (from the past 240 hour(s))  Surgical PCR screen     Status: None   Collection Time: 02/07/19  8:25 PM   Specimen: Nasal Mucosa; Nasal Swab  Result Value Ref Range Status   MRSA, PCR NEGATIVE NEGATIVE Final   Staphylococcus aureus NEGATIVE NEGATIVE Final    Comment: (NOTE) The Xpert SA Assay (FDA approved for NASAL specimens in patients 67 years of age and older), is one component of a comprehensive surveillance program. It is not intended to diagnose infection nor to guide or monitor treatment. Performed at [facility], [place] [address], [place], [place] [address]       Radiology Studies: No results found.    Scheduled Meds: . [MAR Hold] benztropine  1 mg Oral Daily   Or  . [MAR  Hold] benztropine mesylate  1 mg Intramuscular Daily  . [MAR Hold] docusate sodium  100 mg Oral BID  . [MAR Hold] enoxaparin (LOVENOX) injection  30 mg Subcutaneous Q24H  . [MAR Hold] haloperidol  5 mg Oral Daily   Or  . [MAR Hold] haloperidol lactate  5 mg Intramuscular Daily  . [MAR Hold] LORazepam  2 mg Oral Daily   Or  . [MAR Hold] LORazepam  2 mg Intramuscular Daily  . [MAR Hold] sodium bicarbonate  650 mg Oral BID   Continuous Infusions: . sodium chloride 10 mL/hr at 02/09/19 1001  . [MAR Hold] cefTRIAXone (ROCEPHIN)  IV 1 g (02/08/19 1320)     LOS: 10 days      Time spent: 20 minutes   Dessa Phi, DO [facility] 02/09/2019, 10:28 AM   Available via Epic secure chat 7am-7pm After these hours, please refer to coverage provider listed on [email or web address]

## 2019-02-10 LAB — BASIC METABOLIC PANEL
Anion gap: 14 (ref 5–15)
BUN: 25 mg/dL — ABNORMAL HIGH (ref 6–20)
CO2: 24 mmol/L (ref 22–32)
Calcium: 8 mg/dL — ABNORMAL LOW (ref 8.9–10.3)
Chloride: 105 mmol/L (ref 98–111)
Creatinine, Ser: 6.56 mg/dL — ABNORMAL HIGH (ref 0.61–1.24)
GFR calc Af Amer: 11 mL/min — ABNORMAL LOW (ref >60)
GFR calc non Af Amer: 10 mL/min — ABNORMAL LOW (ref >60)
Glucose, Bld: 106 mg/dL — ABNORMAL HIGH (ref 70–99)
Potassium: 3.7 mmol/L (ref 3.5–5.1)
Sodium: 143 mmol/L (ref 135–145)

## 2019-02-10 MED ORDER — LORAZEPAM 1 MG PO TABS
1.0000 mg | ORAL_TABLET | Freq: Once | ORAL | Status: AC
  Administered 2019-02-10: 1 mg via ORAL
  Filled 2019-02-10: qty 1

## 2019-02-10 NOTE — Progress Notes (Signed)
NP on call stated to give Trazdone to see if it helps calm pt down for the night. RN just gave. Will continue to monitor.   Eleanora Neighbor, RN

## 2019-02-10 NOTE — Progress Notes (Signed)
Pt standing in room and is very anxious, stating Trazodone is not helping. Messaged NP on call to make aware and to see if we can have an order for Ativan once.   Eleanora Neighbor, RN

## 2019-02-10 NOTE — Progress Notes (Addendum)
Pt called complaining of SOB. Oxygen sats are WNL. Pt wanted oxygen on for comfort reasons. RN also raised head of bed. Messaged MD on call to make aware and because pt is also stating he feels anxious. Awaiting response.   Eleanora Neighbor, RN

## 2019-02-10 NOTE — Progress Notes (Signed)
NP on call wrote care order to go ahead and give Haldol dose that was refused earlier due to pt still having anxiety and stating he cannot get relief from Ativan po and Trazodone po that was given. RN called and spoke with pharmacist, Marya Amsler, about pt taking Haldol in addition to other po meds, due to all three causing prolonged Qtc interval. Pharmacist stated that po dose of 5mg  should be fine to give, that IV 10mg  or more would pose more of a risk.  Eleanora Neighbor, RN

## 2019-02-10 NOTE — Progress Notes (Signed)
  [facility] Progress Note    Assessment/ Plan:   1. AKI - severe, rapid rise creat. 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 Looks like Cr is finally downtrending with increase in UOP, peaked at 9.2.  Now on oral bicarb.  No indication for RRT.  Will continue to follow.  Cr 8.0--> 7.5--> 6.5.  Looks like recovery is picking up.  I expect post-ATN diuresis and increasing UOP over the next several days so I don't anticipate needing to add Lasix to regimen.  If Cr goes back to normal will not need f/u in our office; if there is evidence of residual CKD he can be referred on an outpatient basis.  I discussed with the pt to avoid NSAIDs and stick to a 2g Na diet.  Nothing further to add.  Will sign off.  Call with questions.    2. Schizophrenia 3. Bilat wrist wounds/ cellulitis - Rocephin- s/p debridement in OR with plastics today 11/20.   4. Dispo: if Cr still downtrending after OR tomorrow OK to d/c from renal perspective.   Subjective:    Doing well today.  Cr down a full point, 7.5--> 6.5 today.  Notes LE edema that is bothersome.     Objective:   BP 134/80 (BP Location: Right Arm)   Pulse 97   Temp 98.4 F (36.9 C) (Oral)   Resp 18   Ht 6' 0.01" (1.829 m)   Wt 110.9 kg   SpO2 100%   BMI 33.17 kg/m   Intake/Output Summary (Last 24 hours) at 02/10/2019 1104 Last data filed at 02/10/2019 0600 Gross per 24 hour  Intake 1597 ml  Output 25 ml  Net 1572 ml   Weight change:   Physical Exam: Gen: sitting up, NAD CVS: RRR no m/r/g Resp: normal WOB on RA Abd: soft, nontender, NABS Ext: 2+ LE edema, L wrist dressed  Imaging: No results found.  Labs: BMET Recent Labs  Lab 02/04/19 0456 02/05/19 0512 02/06/19 0443 02/07/19 0415 02/08/19 0502 02/09/19 0556 02/10/19 0458  NA 139 138 140 142 142 141 143  K 3.4* 3.3* 3.1* 3.2* 3.3* 3.5 3.7  CL 108 109 105 108 105 104 105  CO2 18* 19* 21* 23 24 23 24   GLUCOSE 86 100* 97 91 99 93 106*  BUN 36* 36* 33* 30* 28* 27* 25*   CREATININE 9.14* 9.20* 8.95* 8.61* 8.05* 7.56* 6.56*  CALCIUM 7.9* 7.6* 8.0* 7.7* 8.1* 7.8* 8.0*   CBC Recent Labs  Lab 02/04/19 0456 02/05/19 0512 02/06/19 0443 02/07/19 0415  WBC 7.6 7.7 7.4 7.3  NEUTROABS 5.0 4.9 4.7 4.5  HGB 11.2* 11.2* 11.7* 10.8*  HCT 33.7* 32.5* 33.8* 31.6*  MCV 88.5 85.1 84.9 85.2  PLT 259 265 288 301    Medications:    . benztropine  1 mg Oral Daily  . docusate sodium  100 mg Oral BID  . enoxaparin (LOVENOX) injection  30 mg Subcutaneous Q24H  . haloperidol  5 mg Oral Daily  . LORazepam  2 mg Oral Daily  . sodium bicarbonate  650 mg Oral BID      Madelon Lips, MD 02/10/2019, 11:04 AM

## 2019-02-10 NOTE — Progress Notes (Signed)
PROGRESS NOTE    Tyrone Jenkins  NUU:[id number] DOB: January 21, 1981 DOA: 01/27/2019 PCP: Patient, No Pcp Per     Brief Narrative:  Tyrone Chessimothy R Casanas is a 38 y.o. M with schizophrenia who presented with being found wandering. Patient was found walking around on the road on 11/7 and was noted to have abrasions on his R wrist and under chin. He was also found to have a large open wound to his left wrist with uneven edges, partial necrosis, and mild surrounding erythema. Wet to dry dressings were applied. With further evaluation, the patient was noted to have h/o schizophrenia and has spells where he doesn't talk. Over the next few days, the thought was the surrounding erythema from his wounds may have been related to wrist restraints. IVC paperwork was completed and he was planned to go to [facility]. However, prior to the transfer, CT appeared to confirm cellulitis. Hand surgery was consulted. Patient subsequently developed severe renal failure while on vancomycin. Nephrology was consulted. Patient was re-evaluated by psychiatry who cleared from from psych stand point. He ultimately underwent left wrist debridement with Dr. Arita MissPace 11/20.   New events last 24 hours / Subjective: Feeling well overall. Concerned about swelling in his feet.   Assessment & Plan:   Principal Problem:   Cellulitis of arm Active Problems:   Schizophrenia, unspecified (HCC)   Necrosis (HCC)   Elevated serum creatinine    AKI  -Creatinine baseline appears to be around 1.1-1.4 -Renal US showed no hydronephrosis -FeNa <1% -Creatinine continues to improve slowly. Nephrology consulted; signed off 11/20  Bilateral wrist cellulitis -S/P Vanco, Zosyn, and clindamycin at the time of admission --> rocephin. Now antibiotics discontinued after debridement and discussion with plastic surgery  -Dr Arita MissPace from plastic surgery was consulted, s/p debridement 11/20  Schizophrenia -No longer requiring IVC -Psychiatry  reevaluated patient on 02/06/2019, DC'd one-to-one sitter -Continue Ativan, Cogentin, Haldol    DVT prophylaxis: Lovenox  Code Status: Full Family Communication: None at bedside Disposition Plan: Ted hose and elevate legs due to severe LE edema. Continue to monitor Cr. Wound recheck by Dr. Thomos LemonsPace's team on Monday.    Consultants:   Plastic surgery  Psychiatry  Nephrology   Antimicrobials:  Anti-infectives (From admission, onward)   Start     Dose/Rate Route Frequency Ordered Stop   02/01/19 1000  cefTRIAXone (ROCEPHIN) 1 g in sodium chloride 0.9 % 100 mL IVPB  Status:  Discontinued     1 g 200 mL/hr over 30 Minutes Intravenous Every 24 hours 01/31/19 1210 02/09/19 1154   01/31/19 1300  metroNIDAZOLE (FLAGYL) IVPB 500 mg  Status:  Discontinued     500 mg 100 mL/hr over 60 Minutes Intravenous Every 8 hours 01/31/19 1210 02/01/19 0750   01/31/19 1000  vancomycin (VANCOCIN) 1,250 mg in sodium chloride 0.9 % 250 mL IVPB  Status:  Discontinued     1,250 mg 166.7 mL/hr over 90 Minutes Intravenous Every 12 hours 01/30/19 0053 01/30/19 1058   01/31/19 0941  vancomycin variable dose per unstable renal function (pharmacist dosing)  Status:  Discontinued      Does not apply See admin instructions 01/31/19 0942 01/31/19 1206   01/31/19 0600  vancomycin (VANCOCIN) 2,000 mg in sodium chloride 0.9 % 500 mL IVPB  Status:  Discontinued     2,000 mg 250 mL/hr over 120 Minutes Intravenous Every 24 hours 01/30/19 1058 01/31/19 0941   01/29/19 2115  vancomycin (VANCOCIN) 2,000 mg in sodium chloride 0.9 % 500 mL  IVPB  Status:  Discontinued     2,000 mg 250 mL/hr over 120 Minutes Intravenous  Once 01/29/19 2100 01/30/19 0145   01/29/19 2115  clindamycin (CLEOCIN) IVPB 900 mg  Status:  Discontinued     900 mg 100 mL/hr over 30 Minutes Intravenous  Once 01/29/19 2100 01/30/19 0027   01/29/19 2115  piperacillin-tazobactam (ZOSYN) IVPB 3.375 g     3.375 g 100 mL/hr over 30 Minutes Intravenous  Once  01/29/19 2100 01/30/19 0027   01/29/19 2000  cefTRIAXone (ROCEPHIN) injection 250 mg  Status:  Discontinued     250 mg Intramuscular Every 24 hours 01/29/19 1931 01/29/19 2058   01/28/19 1400  cephALEXin (KEFLEX) capsule 500 mg  Status:  Discontinued     500 mg Oral Every 8 hours 01/28/19 1308 01/29/19 1931       Objective: Vitals:   02/09/19 2015 02/10/19 0452 02/10/19 0750 02/10/19 0852  BP: (!) 148/93 (!) 158/96 (!) 151/105 134/80  Pulse: 97 (!) 107 97   Resp: 16 18 18    Temp: 98 F (36.7 C) 98.5 F (36.9 C) 98.4 F (36.9 C)   TempSrc: Oral Oral Oral   SpO2: 97% 96% 100%   Weight:  110.9 kg    Height:        Intake/Output Summary (Last 24 hours) at 02/10/2019 1004 Last data filed at 02/10/2019 0600 Gross per 24 hour  Intake 1597 ml  Output 25 ml  Net 1572 ml   Filed Weights   02/06/19 0424 02/07/19 0444 02/10/19 0452  Weight: 110.6 kg 110 kg 110.9 kg    Examination: General exam: Appears calm and comfortable  Respiratory system: Clear to auscultation. Respiratory effort normal. Cardiovascular system: S1 & S2 heard, RRR. +bilateral pitting pedal edema. Gastrointestinal system: Abdomen is nondistended, soft and nontender. Normal bowel sounds heard. Central nervous system: Alert and oriented. Non focal exam. Speech clear  Extremities: Symmetric in appearance bilaterally  Skin: Bilateral wrists in clean and dry dressing  Psychiatry: Judgement and insight appear stable. Mood & affect appropriate.    Data Reviewed: I have personally reviewed following labs and imaging studies  CBC: Recent Labs  Lab 02/04/19 0456 02/05/19 0512 02/06/19 0443 02/07/19 0415  WBC 7.6 7.7 7.4 7.3  NEUTROABS 5.0 4.9 4.7 4.5  HGB 11.2* 11.2* 11.7* 10.8*  HCT 33.7* 32.5* 33.8* 31.6*  MCV 88.5 85.1 84.9 85.2  PLT 259 265 288 244   Basic Metabolic Panel: Recent Labs  Lab 02/06/19 0443 02/07/19 0415 02/08/19 0502 02/09/19 0556 02/10/19 0458  NA 140 142 142 141 143  K 3.1*  3.2* 3.3* 3.5 3.7  CL 105 108 105 104 105  CO2 21* 23 24 23 24   GLUCOSE 97 91 99 93 106*  BUN 33* 30* 28* 27* 25*  CREATININE 8.95* 8.61* 8.05* 7.56* 6.56*  CALCIUM 8.0* 7.7* 8.1* 7.8* 8.0*  MG  --   --  1.8  --   --    GFR: Estimated Creatinine Clearance: 19.5 mL/min (A) (by C-G formula based on SCr of 6.56 mg/dL (H)). Liver Function Tests: No results for input(s): AST, ALT, ALKPHOS, BILITOT, PROT, ALBUMIN in the last 168 hours. No results for input(s): LIPASE, AMYLASE in the last 168 hours. No results for input(s): AMMONIA in the last 168 hours. Coagulation Profile: No results for input(s): INR, PROTIME in the last 168 hours. Cardiac Enzymes: No results for input(s): CKTOTAL, CKMB, CKMBINDEX, TROPONINI in the last 168 hours. BNP (last 3 results) No results for  input(s): PROBNP in the last 8760 hours. HbA1C: No results for input(s): HGBA1C in the last 72 hours. CBG: No results for input(s): GLUCAP in the last 168 hours. Lipid Profile: No results for input(s): CHOL, HDL, LDLCALC, TRIG, CHOLHDL, LDLDIRECT in the last 72 hours. Thyroid Function Tests: No results for input(s): TSH, T4TOTAL, FREET4, T3FREE, THYROIDAB in the last 72 hours. Anemia Panel: No results for input(s): VITAMINB12, FOLATE, FERRITIN, TIBC, IRON, RETICCTPCT in the last 72 hours. Sepsis Labs: No results for input(s): PROCALCITON, LATICACIDVEN in the last 168 hours.  Recent Results (from the past 240 hour(s))  Surgical PCR screen     Status: None   Collection Time: 02/07/19  8:25 PM   Specimen: Nasal Mucosa; Nasal Swab  Result Value Ref Range Status   MRSA, PCR NEGATIVE NEGATIVE Final   Staphylococcus aureus NEGATIVE NEGATIVE Final    Comment: (NOTE) The Xpert SA Assay (FDA approved for NASAL specimens in patients 50 years of age and older), is one component of a comprehensive surveillance program. It is not intended to diagnose infection nor to guide or monitor treatment. Performed at [facility], [address], [place], [place] [address]       Radiology Studies: No results found.    Scheduled Meds: . benztropine  1 mg Oral Daily   Or  . benztropine mesylate  1 mg Intramuscular Daily  . docusate sodium  100 mg Oral BID  . enoxaparin (LOVENOX) injection  30 mg Subcutaneous Q24H  . haloperidol  5 mg Oral Daily   Or  . haloperidol lactate  5 mg Intramuscular Daily  . LORazepam  2 mg Oral Daily   Or  . LORazepam  2 mg Intramuscular Daily  . sodium bicarbonate  650 mg Oral BID   Continuous Infusions: . sodium chloride 10 mL/hr at 02/09/19 1001     LOS: 11 days      Time spent: 20 minutes   Noralee Stain, DO [facility] 02/10/2019, 10:04 AM   Available via Epic secure chat 7am-7pm After these hours, please refer to coverage provider listed on [email or web address]

## 2019-02-11 ENCOUNTER — Inpatient Hospital Stay (HOSPITAL_COMMUNITY): Payer: 59

## 2019-02-11 DIAGNOSIS — I371 Nonrheumatic pulmonary valve insufficiency: Secondary | ICD-10-CM

## 2019-02-11 DIAGNOSIS — R7989 Other specified abnormal findings of blood chemistry: Secondary | ICD-10-CM

## 2019-02-11 DIAGNOSIS — L03119 Cellulitis of unspecified part of limb: Secondary | ICD-10-CM

## 2019-02-11 DIAGNOSIS — J9601 Acute respiratory failure with hypoxia: Secondary | ICD-10-CM

## 2019-02-11 DIAGNOSIS — N179 Acute kidney failure, unspecified: Secondary | ICD-10-CM

## 2019-02-11 LAB — BLOOD GAS, ARTERIAL
Acid-Base Excess: 1.7 mmol/L (ref 0.0–2.0)
Bicarbonate: 25.3 mmol/L (ref 20.0–28.0)
Drawn by: 41422
FIO2: 32
O2 Saturation: 87.8 %
Patient temperature: 37
pCO2 arterial: 36.4 mmHg (ref 32.0–48.0)
pH, Arterial: 7.456 — ABNORMAL HIGH (ref 7.350–7.450)
pO2, Arterial: 53.5 mmHg — ABNORMAL LOW (ref 83.0–108.0)

## 2019-02-11 LAB — CBC
HCT: 32.7 % — ABNORMAL LOW (ref 39.0–52.0)
Hemoglobin: 10.9 g/dL — ABNORMAL LOW (ref 13.0–17.0)
MCH: 29.5 pg (ref 26.0–34.0)
MCHC: 33.3 g/dL (ref 30.0–36.0)
MCV: 88.4 fL (ref 80.0–100.0)
Platelets: 353 10*3/uL (ref 150–400)
RBC: 3.7 MIL/uL — ABNORMAL LOW (ref 4.22–5.81)
RDW: 13.4 % (ref 11.5–15.5)
WBC: 15.5 10*3/uL — ABNORMAL HIGH (ref 4.0–10.5)
nRBC: 0 % (ref 0.0–0.2)

## 2019-02-11 LAB — ECHOCARDIOGRAM COMPLETE
Height: 72.008 in
Weight: 3876.57 oz

## 2019-02-11 LAB — GLUCOSE, CAPILLARY
Glucose-Capillary: 178 mg/dL — ABNORMAL HIGH (ref 70–99)
Glucose-Capillary: 191 mg/dL — ABNORMAL HIGH (ref 70–99)
Glucose-Capillary: 90 mg/dL (ref 70–99)
Glucose-Capillary: 96 mg/dL (ref 70–99)
Glucose-Capillary: 97 mg/dL (ref 70–99)

## 2019-02-11 LAB — HEMOGLOBIN A1C
Hgb A1c MFr Bld: 5.3 % (ref 4.8–5.6)
Mean Plasma Glucose: 105.41 mg/dL

## 2019-02-11 LAB — POCT I-STAT 7, (LYTES, BLD GAS, ICA,H+H)
Acid-base deficit: 3 mmol/L — ABNORMAL HIGH (ref 0.0–2.0)
Bicarbonate: 24.8 mmol/L (ref 20.0–28.0)
Calcium, Ion: 1.05 mmol/L — ABNORMAL LOW (ref 1.15–1.40)
HCT: 38 % — ABNORMAL LOW (ref 39.0–52.0)
Hemoglobin: 12.9 g/dL — ABNORMAL LOW (ref 13.0–17.0)
O2 Saturation: 91 %
Patient temperature: 99.3
Potassium: 3.4 mmol/L — ABNORMAL LOW (ref 3.5–5.1)
Sodium: 141 mmol/L (ref 135–145)
TCO2: 27 mmol/L (ref 22–32)
pCO2 arterial: 58.9 mmHg — ABNORMAL HIGH (ref 32.0–48.0)
pH, Arterial: 7.235 — ABNORMAL LOW (ref 7.350–7.450)
pO2, Arterial: 73 mmHg — ABNORMAL LOW (ref 83.0–108.0)

## 2019-02-11 LAB — BASIC METABOLIC PANEL
Anion gap: 11 (ref 5–15)
BUN: 24 mg/dL — ABNORMAL HIGH (ref 6–20)
CO2: 25 mmol/L (ref 22–32)
Calcium: 7.7 mg/dL — ABNORMAL LOW (ref 8.9–10.3)
Chloride: 105 mmol/L (ref 98–111)
Creatinine, Ser: 5.27 mg/dL — ABNORMAL HIGH (ref 0.61–1.24)
GFR calc Af Amer: 15 mL/min — ABNORMAL LOW (ref >60)
GFR calc non Af Amer: 13 mL/min — ABNORMAL LOW (ref >60)
Glucose, Bld: 109 mg/dL — ABNORMAL HIGH (ref 70–99)
Potassium: 3.6 mmol/L (ref 3.5–5.1)
Sodium: 141 mmol/L (ref 135–145)

## 2019-02-11 LAB — D-DIMER, QUANTITATIVE: D-Dimer, Quant: 2.82 ug/mL-FEU — ABNORMAL HIGH (ref 0.00–0.50)

## 2019-02-11 LAB — BRAIN NATRIURETIC PEPTIDE: B Natriuretic Peptide: >4500 pg/mL — ABNORMAL HIGH (ref 0.0–100.0)

## 2019-02-11 LAB — AMMONIA: Ammonia: 44 umol/L — ABNORMAL HIGH (ref 9–35)

## 2019-02-11 LAB — CREATININE, URINE, RANDOM: Creatinine, Urine: 48.87 mg/dL

## 2019-02-11 MED ORDER — FENTANYL 2500MCG IN NS 250ML (10MCG/ML) PREMIX INFUSION
0.0000 ug/h | INTRAVENOUS | Status: DC
  Administered 2019-02-11: 100 ug/h via INTRAVENOUS
  Administered 2019-02-11: 300 ug/h via INTRAVENOUS
  Administered 2019-02-12: 200 ug/h via INTRAVENOUS
  Filled 2019-02-11 (×3): qty 250

## 2019-02-11 MED ORDER — ROCURONIUM BROMIDE 50 MG/5ML IV SOLN
70.0000 mg | Freq: Once | INTRAVENOUS | Status: AC
  Administered 2019-02-11: 70 mg via INTRAVENOUS

## 2019-02-11 MED ORDER — MIDAZOLAM HCL 2 MG/2ML IJ SOLN: 2.0000 mg | INTRAMUSCULAR | Status: DC | PRN

## 2019-02-11 MED ORDER — DOCUSATE SODIUM 50 MG/5ML PO LIQD
100.0000 mg | Freq: Two times a day (BID) | ORAL | Status: DC
  Administered 2019-02-11 – 2019-02-12 (×3): 100 mg
  Filled 2019-02-11 (×3): qty 10

## 2019-02-11 MED ORDER — FENTANYL CITRATE (PF) 100 MCG/2ML IJ SOLN
INTRAMUSCULAR | Status: AC
  Filled 2019-02-11: qty 2

## 2019-02-11 MED ORDER — PHENYLEPHRINE HCL-NACL 10-0.9 MG/250ML-% IV SOLN
INTRAVENOUS | Status: AC
  Filled 2019-02-11: qty 250

## 2019-02-11 MED ORDER — FENTANYL CITRATE (PF) 100 MCG/2ML IJ SOLN: 50.0000 ug | INTRAMUSCULAR | Status: DC | PRN

## 2019-02-11 MED ORDER — FUROSEMIDE 10 MG/ML IJ SOLN
80.0000 mg | Freq: Two times a day (BID) | INTRAMUSCULAR | Status: DC
  Administered 2019-02-11 – 2019-02-12 (×4): 80 mg via INTRAVENOUS
  Filled 2019-02-11 (×4): qty 8

## 2019-02-11 MED ORDER — CHLORHEXIDINE GLUCONATE CLOTH 2 % EX PADS
6.0000 | MEDICATED_PAD | Freq: Every day | CUTANEOUS | Status: DC
  Administered 2019-02-11 – 2019-02-13 (×3): 6 via TOPICAL

## 2019-02-11 MED ORDER — LABETALOL HCL 5 MG/ML IV SOLN
0.5000 mg/min | INTRAVENOUS | Status: DC
  Filled 2019-02-11: qty 100

## 2019-02-11 MED ORDER — ORAL CARE MOUTH RINSE
15.0000 mL | OROMUCOSAL | Status: DC
  Administered 2019-02-11 – 2019-02-12 (×16): 15 mL via OROMUCOSAL

## 2019-02-11 MED ORDER — LACTATED RINGERS IV SOLN
INTRAVENOUS | Status: DC
  Administered 2019-02-11: 08:00:00 via INTRAVENOUS

## 2019-02-11 MED ORDER — POTASSIUM CHLORIDE 20 MEQ/15ML (10%) PO SOLN
40.0000 meq | Freq: Three times a day (TID) | ORAL | Status: AC
  Administered 2019-02-11 (×2): 40 meq
  Filled 2019-02-11 (×2): qty 30

## 2019-02-11 MED ORDER — FENTANYL CITRATE (PF) 100 MCG/2ML IJ SOLN
50.0000 ug | INTRAMUSCULAR | Status: DC | PRN
  Administered 2019-02-11: 50 ug via INTRAVENOUS
  Filled 2019-02-11: qty 2

## 2019-02-11 MED ORDER — DIPHENHYDRAMINE HCL 50 MG/ML IJ SOLN
25.0000 mg | Freq: Once | INTRAMUSCULAR | Status: AC
  Administered 2019-02-11: 25 mg via INTRAVENOUS

## 2019-02-11 MED ORDER — METOPROLOL TARTRATE 5 MG/5ML IV SOLN
5.0000 mg | Freq: Once | INTRAVENOUS | Status: AC
  Administered 2019-02-11: 08:00:00 5 mg via INTRAVENOUS

## 2019-02-11 MED ORDER — INSULIN ASPART 100 UNIT/ML ~~LOC~~ SOLN
0.0000 [IU] | SUBCUTANEOUS | Status: DC
  Administered 2019-02-13: 2 [IU] via SUBCUTANEOUS

## 2019-02-11 MED ORDER — SODIUM BICARBONATE 650 MG PO TABS
650.0000 mg | ORAL_TABLET | Freq: Two times a day (BID) | ORAL | Status: DC
  Administered 2019-02-11 (×2): 650 mg
  Filled 2019-02-11 (×2): qty 1

## 2019-02-11 MED ORDER — ETOMIDATE 2 MG/ML IV SOLN
20.0000 mg | Freq: Once | INTRAVENOUS | Status: AC
  Administered 2019-02-11: 20 mg via INTRAVENOUS

## 2019-02-11 MED ORDER — MIDAZOLAM HCL 2 MG/2ML IJ SOLN
INTRAMUSCULAR | Status: AC
  Administered 2019-02-11: 2 mg
  Filled 2019-02-11: qty 2

## 2019-02-11 MED ORDER — BISACODYL 10 MG RE SUPP: 10.0000 mg | Freq: Every day | RECTAL | Status: DC | PRN

## 2019-02-11 MED ORDER — PROPOFOL 1000 MG/100ML IV EMUL
0.0000 ug/kg/min | INTRAVENOUS | Status: DC
  Administered 2019-02-11: 5 ug/kg/min via INTRAVENOUS
  Filled 2019-02-11: qty 100

## 2019-02-11 MED ORDER — FUROSEMIDE 10 MG/ML IJ SOLN
40.0000 mg | Freq: Once | INTRAMUSCULAR | Status: AC
  Administered 2019-02-11: 40 mg via INTRAVENOUS
  Filled 2019-02-11: qty 4

## 2019-02-11 MED ORDER — MIDAZOLAM HCL 2 MG/2ML IJ SOLN: 2.0000 mg | Freq: Once | INTRAMUSCULAR | Status: AC

## 2019-02-11 MED ORDER — MIDAZOLAM HCL 2 MG/2ML IJ SOLN
2.0000 mg | INTRAMUSCULAR | Status: AC | PRN
  Administered 2019-02-11 – 2019-02-12 (×3): 2 mg via INTRAVENOUS
  Filled 2019-02-11 (×3): qty 2

## 2019-02-11 MED ORDER — FENTANYL CITRATE (PF) 100 MCG/2ML IJ SOLN
100.0000 ug | Freq: Once | INTRAMUSCULAR | Status: AC
  Administered 2019-02-11: 100 ug via INTRAVENOUS

## 2019-02-11 MED ORDER — SODIUM CHLORIDE 0.9 % IV SOLN
250.0000 mL | INTRAVENOUS | Status: DC
  Administered 2019-02-11 (×2): 250 mL via INTRAVENOUS

## 2019-02-11 MED ORDER — PANTOPRAZOLE SODIUM 40 MG IV SOLR
40.0000 mg | Freq: Every day | INTRAVENOUS | Status: DC
  Administered 2019-02-11 – 2019-02-12 (×2): 40 mg via INTRAVENOUS
  Filled 2019-02-11 (×2): qty 40

## 2019-02-11 MED ORDER — DOCUSATE SODIUM 50 MG/5ML PO LIQD: 100.0000 mg | Freq: Two times a day (BID) | ORAL | Status: DC | PRN

## 2019-02-11 MED ORDER — CHLORHEXIDINE GLUCONATE 0.12% ORAL RINSE (MEDLINE KIT)
15.0000 mL | Freq: Two times a day (BID) | OROMUCOSAL | Status: DC
  Administered 2019-02-11 – 2019-02-12 (×4): 15 mL via OROMUCOSAL

## 2019-02-11 MED ORDER — MAGNESIUM SULFATE 2 GM/50ML IV SOLN
2.0000 g | Freq: Once | INTRAVENOUS | Status: AC
  Administered 2019-02-11: 2 g via INTRAVENOUS
  Filled 2019-02-11: qty 50

## 2019-02-11 MED ORDER — NOREPINEPHRINE 4 MG/250ML-% IV SOLN: 2.0000 ug/min | INTRAVENOUS | Status: DC

## 2019-02-11 MED ORDER — CALCIUM GLUCONATE-NACL 1-0.675 GM/50ML-% IV SOLN
1.0000 g | Freq: Once | INTRAVENOUS | Status: AC
  Administered 2019-02-11: 1000 mg via INTRAVENOUS
  Filled 2019-02-11: qty 50

## 2019-02-11 MED ORDER — DIPHENHYDRAMINE HCL 50 MG/ML IJ SOLN
INTRAMUSCULAR | Status: AC
  Filled 2019-02-11: qty 1

## 2019-02-11 MED ORDER — METOPROLOL TARTRATE 5 MG/5ML IV SOLN
INTRAVENOUS | Status: AC
  Administered 2019-02-11: 5 mg via INTRAVENOUS
  Filled 2019-02-11: qty 5

## 2019-02-11 NOTE — Progress Notes (Signed)
Pt states " I feel like I am having a panic attack." NP on call was just paged for reports of anxiety from pt. Awaiting response.   Eleanora Neighbor, RN

## 2019-02-11 NOTE — Progress Notes (Signed)
  PROGRESS NOTE  Overnight events reviewed. Patient transferred to ICU due to respiratory distress, intubated early this morning. Please call [facility] flow manager once patient extubated and stable to transfer out of ICU.    Dessa Phi, DO [facility] 02/11/2019, 7:49 AM  Available via Epic secure chat 7am-7pm After these hours, please refer to coverage provider listed on [email or web address]

## 2019-02-11 NOTE — Progress Notes (Signed)
Echocardiogram 2D Echocardiogram has been performed.  Oneal Deputy Letecia Arps 02/11/2019, 9:53 AM

## 2019-02-11 NOTE — Anesthesia Postprocedure Evaluation (Signed)
Anesthesia Post Note  Patient: Tyrone Jenkins  Procedure(s) Performed: debridement of left wrist wound with Acell placement (Left Wrist)     Patient location during evaluation: PACU Anesthesia Type: General Level of consciousness: sedated and patient cooperative Pain management: pain level controlled Vital Signs Assessment: post-procedure vital signs reviewed and stable Respiratory status: spontaneous breathing Cardiovascular status: stable Anesthetic complications: no    Last Vitals:  Vitals:   02/11/19 2030 02/11/19 2100  BP: 108/81 109/81  Pulse: 91 76  Resp: (!) 26 (!) 24  Temp:    SpO2: 100% 100%    Last Pain:  Vitals:   02/11/19 1954  TempSrc: Oral  PainSc:                  Nolon Nations

## 2019-02-11 NOTE — Progress Notes (Signed)
PIV already started upon arrival.

## 2019-02-11 NOTE — Significant Event (Signed)
Rapid Response Event Note  Overview:Called d/t resp distress. Pt here for cellulitis, vanc nephrotoxicity. Per RN, pt began to drop SpO2-80s on RA at 0410. Pt placed on [place] and titrated up to 3L(SpO2-92%). NP notified and ordered PCXR(Cardiomegaly with moderate diffuse pulmonary interstitial edema.) and ABG(7.45/36.4/53.5/25.3). After PCXR resulted, NP ordered 40mg  IV lasix. Lasix given at 0532 yielding approx 400cc urine output. At 0544, RRT called d/t resp distress, SpO2-79% on 4LNC. RN to place pt on NRB. Time Called: 6546 Arrival Time: 5035 Event Type: Respiratory  Initial Focused Assessment: Pt sitting on side of bed in respiratory distress, +WOB, + accessory muscle use. Lungs crackly. Pt coughing up large amounts white frothy sputum.  HR-140s, BP-162/110, RR-30s, SpO2-93% on NRB.  Interventions: PCXR, ABG, 40mg  IV lasix done PTA RRT.  NRB  D-dimer Foley cath Plan of Care (if not transferred): Continue to monitor respiratory status closely. Place foley catheter for strict I and O monitoring. Call RRT if further assistance needed.  Update: 0620-Pt gray/dusky, Sp02-80s on NRB, HR-170s. Pt to tx emergently to 2M02 for intubation.  Event Summary: Name of Physician Notified: Kennon Holter, NP at [place]    at          [place], Carren Rang

## 2019-02-11 NOTE — Progress Notes (Signed)
Pt was c/o SOB and was very anxious around 0415. Rapid Response, Respiratory, and NP was made aware and came to evaluate. Pt became hypoxic and sating at 79 on nasal cannula. A non-rebreather was put on and it brought O2 up to 98. Pt was also very tachy at 140-170. Pt coughing up yellowish-brown sputum. Lungs sound diminished and pt was very anxious taking mask off and getting OOB. Pt kept stating that he could not take this and that he wanted the mask off. Pt desats quickly when he takes it off. Pt was instructed to take slow deep breathes, however pt was too anxious for this to be effective; pt kept breathing fast and shallow. Pt was given a benadryl to help relax him and a foley was placed for accurate output. Pt was then transferred off the unit to 2M04. Report was given.   Eleanora Neighbor, RN

## 2019-02-11 NOTE — Progress Notes (Signed)
  [facility] Progress Note    Assessment/ Plan:   1. AKI - severe, rapid rise creat. 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 Looks like Cr is finally downtrending with increase in UOP, peaked at 9.2.  Now on oral bicarb.  No indication for RRT.  Will continue to follow.  Cr 8.0--> 7.5--> 6.5--> 5.27.  Augment UOP with IV Lasix 80 BID and follow.  Have stopped LR started this AM.    2. Schizophrenia  3. Bilat wrist wounds/ cellulitis - Rocephin- s/p debridement in OR with plastics 11/20.    4. Dispo: in ICU   Subjective:    Pt had what looks like flash pulm edema last night and was intubated, transferred to ICU.     Objective:   BP (!) 134/91   Pulse (!) 154   Temp 98.8 F (37.1 C) (Oral)   Resp 20   Ht 6' 0.01" (1.829 m)   Wt 109.9 kg   SpO2 95%   BMI 32.85 kg/m   Intake/Output Summary (Last 24 hours) at 02/11/2019 0802 Last data filed at 02/11/2019 0200 Gross per 24 hour  Intake 120 ml  Output -  Net 120 ml   Weight change: -1.05 kg  Physical Exam: Gen: in ICU, intubated CVS: tachycardic Resp: diffuse crackles Abd: soft, nontender, NABS Ext: 2+ LE edema, L wrist dressed  Imaging: Dg Chest Port 1 View  Result Date: 02/11/2019 CLINICAL DATA:  Initial evaluation for worsening shortness of breath. EXAM: PORTABLE CHEST 1 VIEW COMPARISON:  None available. FINDINGS: Cardiomegaly.  Mediastinal silhouette within normal limits. Lungs normally inflated. Diffuse vascular congestion with interstitial prominence, compatible with moderate diffuse pulmonary interstitial edema. No definite pleural effusion. No consolidative airspace disease. No pneumothorax. No acute osseous finding. IMPRESSION: Cardiomegaly with moderate diffuse pulmonary interstitial edema. Electronically Signed   By: Jeannine Boga M.D.   On: 02/11/2019 04:47    Labs: BMET Recent Labs  Lab 02/05/19 0512 02/06/19 0443 02/07/19 0415 02/08/19 0502 02/09/19 0556 02/10/19 0458 02/11/19 0513   NA 138 140 142 142 141 143 141  K 3.3* 3.1* 3.2* 3.3* 3.5 3.7 3.6  CL 109 105 108 105 104 105 105  CO2 19* 21* '23 24 23 24 25  '$ GLUCOSE 100* 97 91 99 93 106* 109*  BUN 36* 33* 30* 28* 27* 25* 24*  CREATININE 9.20* 8.95* 8.61* 8.05* 7.56* 6.56* 5.27*  CALCIUM 7.6* 8.0* 7.7* 8.1* 7.8* 8.0* 7.7*   CBC Recent Labs  Lab 02/05/19 0512 02/06/19 0443 02/07/19 0415  WBC 7.7 7.4 7.3  NEUTROABS 4.9 4.7 4.5  HGB 11.2* 11.7* 10.8*  HCT 32.5* 33.8* 31.6*  MCV 85.1 84.9 85.2  PLT 265 288 301    Medications:    . benztropine  1 mg Oral Daily  . chlorhexidine gluconate (MEDLINE KIT)  15 mL Mouth Rinse BID  . Chlorhexidine Gluconate Cloth  6 each Topical Daily  . diphenhydrAMINE      . docusate sodium  100 mg Oral BID  . enoxaparin (LOVENOX) injection  30 mg Subcutaneous Q24H  . haloperidol  5 mg Oral Daily  . LORazepam  2 mg Oral Daily  . mouth rinse  15 mL Mouth Rinse 10 times per day  . metoprolol tartrate      . pantoprazole (PROTONIX) IV  40 mg Intravenous Daily  . sodium bicarbonate  650 mg Per Tube BID      Madelon Lips, MD 02/11/2019, 8:02 AM

## 2019-02-11 NOTE — Progress Notes (Signed)
Pt is still complaining of SOB and oxygen is sating in the 80s with O2 put on at 2 LPM. Messaged NP on call to see if we can have an order for a chest x-ray and for further instructions. Will continue to monitor.   Eleanora Neighbor, RN

## 2019-02-11 NOTE — Progress Notes (Signed)
ABG results given to NP, vent settings adjusted. See flowsheet.

## 2019-02-11 NOTE — Progress Notes (Addendum)
Called by bedside RN throughout the night with concerns for increased anxiety and eventually increased SOB and hypoxemia. On assessment, pt is tachypneic, tachycardiac and hypertensive. He is coughing up frothy yellow/brown sputum. LS are diminished throughout. He complains of not being able to breathe with the nonrebreather on and appears extremely anxious.    Hypoxemia - Chest xray shows moderate diffuse pulmonary interstitial edema. Given pt rapid desaturation and symptoms of pulmonary edema given one time dose of lasix IV. Will continue to monitor kidney function.  - ABG stat - Continue with ventimask and [place] -Transfer to PCU for closer monitoring - Recheck ABG in 4 hours - D-Dimer + will do CTA - pt unable to tolerate mask and is actively spitting up frothy sputum and vomiting. Poor candidate for bipap. If he continues to desat may require intubation.   AKI -Repeat CMP - Continue to hold nephrotoxic agents   Anxiety - Given haldol, ativan and benadryl throughout the night with intermittent improvement with anxiety levels. Continue with current medications of ativan, haldol and cogentin     Pt rapidly decompensating moved to ICU and requiring intubation ASAP. PCCM consulted.   Lovey Newcomer, NP [facility] 7p-7a [phone]  CRITICAL CARE Performed by: Neila Gear   Total critical care time: 40 minutes  Critical care time was exclusive of separately billable procedures and treating other patients.  Critical care was necessary to treat or prevent imminent or life-threatening deterioration.  Critical care was time spent personally by me on the following activities: development of treatment plan with patient and/or surrogate as well as nursing, discussions with consultants, evaluation of patient's response to treatment, examination of patient, obtaining history from patient or surrogate, ordering and performing treatments and interventions, ordering and review of  laboratory studies, ordering and review of radiographic studies, pulse oximetry and re-evaluation of patient's condition.

## 2019-02-11 NOTE — Progress Notes (Signed)
Pt transported to 2M02 in respiratory distress and placed on monitor. Nelda Marseille MD arrived at [phone]. 2mg  versed, 100mg  fentanyl, 20mg  etomidate, and 70mg  Rocuronium given for RSI and pt intubated at [place]. OGT placed by Dr. Nelda Marseille. Report given to [place], Therapist, sports.

## 2019-02-11 NOTE — Consult Note (Addendum)
NAME:  Tyrone Jenkins, MRN:  [id number], DOB:  [birth year], LOS: 12 ADMISSION DATE:  01/27/2019, CONSULTATION DATE:  02/11/19 REFERRING MD:  Alvino Chapelhoi - [facility], CHIEF COMPLAINT:  Respiratory failure with hypoxia   Brief History   38 yo M with possible vanc toxicity in setting of bilateral wrist cellulitis, developed respiratory distress overnight and was emergently intubated 11/22  History of present illness   38 yo M PMH schizophrenia who was found wandering 11/7 and noted to have bilateral wrist wounds. Initially IVC'd, development of concern for cellulitis of wrists with necrosis of L wrist tissue. Started on broad spectrum abx (vanc, clinda, zosyn). Developed acute renal failure-- concerning for vanc toxicity. Renal indices continue to gradually improve and patient has not required RRT. 11/20 patient underwent debridement and acell placement of L wrist with plastics. 11/22 overnight patient with rapidly decompensating respiratory status and overt pulmonary edema. ABG 7.456/ 36.4/ 53.5// 1.7/ 25.3/ 87.8  Transferred to ICU and intubated emergently   Past Medical History  Schizophrenia   Significant Hospital Events   11/7 presented 11/20 debridement and acell placement of L wrist wound 11/22 respiratory distress, transferred to ICU and emergently intubated   Consults:  Plastics Psych Nephrology PCCM  Procedures:  11/20 L wrist debridement with acell placement by plastics 11/22 ETT >>>   Significant Diagnostic Tests:  11/22 CXR >>>  Micro Data:  11/7 SARS CoV2> neg   Antimicrobials:  Previously vanc, clinda, zosyn, rocephin. None as of 11/22  Interim history/subjective:  Moved emergently to ICU Overt respiratory distress   Objective   Blood pressure (!) 153/106, pulse (!) 116, temperature 98.8 F (37.1 C), temperature source Oral, resp. rate (!) 24, height 6' 0.01" (1.829 m), weight 109.9 kg, SpO2 91 %.        Intake/Output Summary (Last 24 hours) at 02/11/2019 0724  Last data filed at 02/11/2019 0200 Gross per 24 hour  Intake 460 ml  Output -  Net 460 ml   Filed Weights   02/07/19 0444 02/10/19 0452 02/11/19 0356  Weight: 110 kg 110.9 kg 109.9 kg    Examination: General: Critically ill appearing adult M, in extremis due to respiratory distress  HENT: NCAT. Pink mmm. Copious frothy secretions. Trachea midline  Lungs: Diffuse crackles. Accessory muscle use. Shallow rapid respirations  Cardiovascular: tachycardic rate. s1s2 no rgm.  Abdomen: soft round ndnt + bowel sounds Extremities: Symmetrical bulk and tone. 1+ BLE edema. No cyanosis or clubbing. L wrist dressing c/d/i Neuro: Moaning, following commands. PERRL GU: defer  Resolved Hospital Problem list     Assessment & Plan:   Acute respiratory failure with hypoxia -copious frothy sputum, appears very volume overloaded which is dramatic change in volume status from prior. (etiology unclear-- has not been given much volume, I/O inaccurate and has been in fact producing urine) P -Emergent intubation -STAT CXR and ABG in 1 hr -goal SpO2 > 92% -ECHO -Pulm hygiene -propofol and PRN fent -diuresis as below  -will cancel CT angio chest ordered overnight   Acute renal failure -in setting of possible vanc toxicity, ATN -Overall Cr is down-trending but patient's volume status is worsening with acute pulm edema prompting intubation -I/O recorded are inaccurate. It remains unclear what has catalyzed acute pulmonary edema but it does not appear that patient has been over-resuscitated from IV/PO fluids  P -insert foley  -FEUrea -nephrology was following but have signed off as overall renal status gradually improving, no RRT indication. Will re-engage nephrology due to acute decompensation above.  Does not appear to have indication for RRT, and will diurese  -Trend renal indices and electrolytes  -Strict I/O -avoid nephrotoxic agents  SVT -seems to have improved following intubation P  -continue ICU monitoring -optimize electrolytes, will check ionized calcium and give mag  L wrist cellulitis with necrosis - initially treated with vanc, zosyn, clinda. Narrowed to ceftriaxone.  - s/p debridement 11/20 with Acell placement -off abx now per Plastics P - acell per plastics   Schizophrenia P -supportive care and continue psych meds    Best practice:  Diet: NPO Pain/Anxiety/Delirium: Propofol, PRN fent  VAP protocol (if indicated): yes DVT prophylaxis: Lovenox GI prophylaxis: protonix  Glucose control: monitor Mobility: BR Code Status: Full  Family Communication: pending Disposition: ICU  Labs   CBC: Recent Labs  Lab 02/05/19 0512 02/06/19 0443 02/07/19 0415  WBC 7.7 7.4 7.3  NEUTROABS 4.9 4.7 4.5  HGB 11.2* 11.7* 10.8*  HCT 32.5* 33.8* 31.6*  MCV 85.1 84.9 85.2  PLT 265 288 301    Basic Metabolic Panel: Recent Labs  Lab 02/07/19 0415 02/08/19 0502 02/09/19 0556 02/10/19 0458 02/11/19 0513  NA 142 142 141 143 141  K 3.2* 3.3* 3.5 3.7 3.6  CL 108 105 104 105 105  CO2 23 24 23 24 25   GLUCOSE 91 99 93 106* 109*  BUN 30* 28* 27* 25* 24*  CREATININE 8.61* 8.05* 7.56* 6.56* 5.27*  CALCIUM 7.7* 8.1* 7.8* 8.0* 7.7*  MG  --  1.8  --   --   --    GFR: Estimated Creatinine Clearance: 24.1 mL/min (A) (by C-G formula based on SCr of 5.27 mg/dL (H)). Recent Labs  Lab 02/05/19 0512 02/06/19 0443 02/07/19 0415  WBC 7.7 7.4 7.3    Liver Function Tests: No results for input(s): AST, ALT, ALKPHOS, BILITOT, PROT, ALBUMIN in the last 168 hours. No results for input(s): LIPASE, AMYLASE in the last 168 hours. No results for input(s): AMMONIA in the last 168 hours.  ABG    Component Value Date/Time   PHART 7.456 (H) 02/11/2019 0503   PCO2ART 36.4 02/11/2019 0503   PO2ART 53.5 (L) 02/11/2019 0503   HCO3 25.3 02/11/2019 0503   O2SAT 87.8 02/11/2019 0503     Coagulation Profile: No results for input(s): INR, PROTIME in the last 168 hours.   Cardiac Enzymes: No results for input(s): CKTOTAL, CKMB, CKMBINDEX, TROPONINI in the last 168 hours.  HbA1C: No results found for: HGBA1C  CBG: Recent Labs  Lab 02/11/19 0704  GLUCAP 191*    Review of Systems:   Unable to obtain, in extremis due to respiratory distress   Past Medical History  He,  has a past medical history of Schizophrenia (HCC).   Surgical History   History reviewed. No pertinent surgical history.   Social History      Family History   His family history is not on file.   Allergies No Known Allergies   Home Medications  Prior to Admission medications   Not on File     Critical care time: 40 minutes     02/13/19 MSN, AGACNP-BC [facility] Tessie Fass If no answer, [phone] 02/11/2019, 3:63 AM  38 year old male with history of schizophrenia who was found wondering the streets and was admitted to behavioral health.  Patient was found to be COVID-19 negative but developed bilateral wrist cellulitis and was sent to [facility].  He was thought to have had a vanc toxicity induced renal failure and that  was quickly improving when on the morning of 11/22 he was noted to be in respiratory distress.  On exam, he is clearly in acute hypoxemic respiratory failure and became unresponsive.  PCCM was consulted emergently and patient was intubated emergently in the ICU where on exam he was tachycardic to 170 and hypertensive to 182/100 and during intubation pink frothy material was exuding from his airway indicative of pulmonary edema.  I reviewed CXR myself, ETT is in a good position and worsening pulmonary edema was noted.  Renal was reconsulted and recommended diureses with lasix at 80 BID.  Gas post intubation showed severe hypoxemia and acute respiratory acidosis.  Will continue diureses.  Adjust vent for ABG.  Lopressors x1 given IV for sinus tach and hypertension.  Start propofol and fentanyl.  If remains hypertensive post sedation  will start a propofol drip.  BMET in AM.  Replace electrolytes.  PCCM will assume care.  The patient is critically ill with multiple organ systems failure and requires high complexity decision making for assessment and support, frequent evaluation and titration of therapies, application of advanced monitoring technologies and extensive interpretation of multiple databases.   Critical Care Time devoted to patient care services described in this note is  60  Minutes. This time reflects time of care of this signee Dr [name]. This critical care time does not reflect procedure time, or teaching time or supervisory time of PA/NP/Med student/Med Resident etc but could involve care discussion time.  Rush Farmer, M.D. [facility].

## 2019-02-11 NOTE — Procedures (Signed)
OGT Placed By MD   Placed under direct laryngoscopy and verified by ascultation   Rush Farmer, M.D. [facility] Pulmonary/Critical Care Medicine.

## 2019-02-11 NOTE — Procedures (Signed)
Intubation Procedure Note Tyrone Jenkins [id number] [birth year]  Procedure: Intubation Indications: Respiratory insufficiency  Procedure Details Consent: Unable to obtain consent because of emergent medical necessity. Time Out: Verified patient identification, verified procedure, site/side was marked, verified correct patient position, special equipment/implants available, medications/allergies/relevent history reviewed, required imaging and test results available.  Performed  Maximum sterile technique was used including antiseptics, cap, gloves, gown, hand hygiene and mask.  MAC    Evaluation Hemodynamic Status: Transient hypertension requiring treatment; O2 sats: stable throughout Patient's Current Condition: stable Complications: No apparent complications Patient did tolerate procedure well. Chest X-ray ordered to verify placement.  CXR: tube position acceptable.   Tyrone Jenkins 02/11/2019

## 2019-02-12 ENCOUNTER — Inpatient Hospital Stay (HOSPITAL_COMMUNITY): Payer: 59

## 2019-02-12 ENCOUNTER — Encounter (HOSPITAL_COMMUNITY): Payer: Self-pay | Admitting: Plastic Surgery

## 2019-02-12 LAB — GLUCOSE, CAPILLARY
Glucose-Capillary: 89 mg/dL (ref 70–99)
Glucose-Capillary: 87 mg/dL (ref 70–99)
Glucose-Capillary: 91 mg/dL (ref 70–99)
Glucose-Capillary: 87 mg/dL (ref 70–99)
Glucose-Capillary: 89 mg/dL (ref 70–99)
Glucose-Capillary: 103 mg/dL — ABNORMAL HIGH (ref 70–99)

## 2019-02-12 LAB — POCT I-STAT 7, (LYTES, BLD GAS, ICA,H+H)
Acid-Base Excess: 6 mmol/L — ABNORMAL HIGH (ref 0.0–2.0)
Bicarbonate: 27.4 mmol/L (ref 20.0–28.0)
Calcium, Ion: 1.07 mmol/L — ABNORMAL LOW (ref 1.15–1.40)
HCT: 35 % — ABNORMAL LOW (ref 39.0–52.0)
Hemoglobin: 11.9 g/dL — ABNORMAL LOW (ref 13.0–17.0)
O2 Saturation: 98 %
Patient temperature: 99
Potassium: 3.4 mmol/L — ABNORMAL LOW (ref 3.5–5.1)
Sodium: 141 mmol/L (ref 135–145)
TCO2: 28 mmol/L (ref 22–32)
pCO2 arterial: 30.4 mmHg — ABNORMAL LOW (ref 32.0–48.0)
pH, Arterial: 7.563 — ABNORMAL HIGH (ref 7.350–7.450)
pO2, Arterial: 86 mmHg (ref 83.0–108.0)

## 2019-02-12 LAB — BASIC METABOLIC PANEL
Anion gap: 10 (ref 5–15)
BUN: 29 mg/dL — ABNORMAL HIGH (ref 6–20)
CO2: 27 mmol/L (ref 22–32)
Calcium: 8.2 mg/dL — ABNORMAL LOW (ref 8.9–10.3)
Chloride: 106 mmol/L (ref 98–111)
Creatinine, Ser: 5.28 mg/dL — ABNORMAL HIGH (ref 0.61–1.24)
GFR calc Af Amer: 15 mL/min — ABNORMAL LOW (ref >60)
GFR calc non Af Amer: 13 mL/min — ABNORMAL LOW (ref >60)
Glucose, Bld: 93 mg/dL (ref 70–99)
Potassium: 3.7 mmol/L (ref 3.5–5.1)
Sodium: 143 mmol/L (ref 135–145)

## 2019-02-12 LAB — UREA NITROGEN, URINE: Urea Nitrogen, Ur: 108 mg/dL

## 2019-02-12 LAB — CBC
HCT: 30 % — ABNORMAL LOW (ref 39.0–52.0)
Hemoglobin: 9.9 g/dL — ABNORMAL LOW (ref 13.0–17.0)
MCH: 29.2 pg (ref 26.0–34.0)
MCHC: 33 g/dL (ref 30.0–36.0)
MCV: 88.5 fL (ref 80.0–100.0)
Platelets: 300 10*3/uL (ref 150–400)
RBC: 3.39 MIL/uL — ABNORMAL LOW (ref 4.22–5.81)
RDW: 13.6 % (ref 11.5–15.5)
WBC: 9.4 10*3/uL (ref 4.0–10.5)
nRBC: 0 % (ref 0.0–0.2)

## 2019-02-12 LAB — TROPONIN I (HIGH SENSITIVITY)
Troponin I (High Sensitivity): 289 ng/L (ref <18)
Troponin I (High Sensitivity): 256 ng/L (ref <18)

## 2019-02-12 LAB — CALCIUM, IONIZED: Calcium, Ionized, Serum: 3.9 mg/dL — ABNORMAL LOW (ref 4.5–5.6)

## 2019-02-12 LAB — TRIGLYCERIDES: Triglycerides: 136 mg/dL (ref <150)

## 2019-02-12 LAB — PHOSPHORUS: Phosphorus: 3.1 mg/dL (ref 2.5–4.6)

## 2019-02-12 LAB — MAGNESIUM: Magnesium: 1.7 mg/dL (ref 1.7–2.4)

## 2019-02-12 MED ORDER — PANTOPRAZOLE SODIUM 40 MG PO PACK
40.0000 mg | PACK | Freq: Every day | ORAL | Status: DC
  Filled 2019-02-12: qty 20

## 2019-02-12 MED ORDER — ISOSORB DINITRATE-HYDRALAZINE 20-37.5 MG PO TABS
0.5000 | ORAL_TABLET | Freq: Three times a day (TID) | ORAL | Status: DC
  Administered 2019-02-12 – 2019-02-14 (×6): 0.5 via ORAL
  Filled 2019-02-12 (×6): qty 1

## 2019-02-12 MED ORDER — ONDANSETRON HCL 4 MG/2ML IJ SOLN
4.0000 mg | Freq: Four times a day (QID) | INTRAMUSCULAR | Status: DC | PRN
  Administered 2019-02-12: 4 mg via INTRAVENOUS
  Filled 2019-02-12: qty 2

## 2019-02-12 MED ORDER — POTASSIUM CHLORIDE 20 MEQ/15ML (10%) PO SOLN
40.0000 meq | Freq: Every day | ORAL | Status: DC
  Administered 2019-02-12: 40 meq via ORAL
  Filled 2019-02-12: qty 30

## 2019-02-12 NOTE — Procedures (Signed)
Extubation Procedure Note  Patient Details:   Name: Tyrone Jenkins DOB: July 01, 1980 MRN: [id number]   Airway Documentation:    Vent end date: 02/12/19 Vent end time: 1055   Evaluation  O2 sats: stable throughout Complications: No apparent complications Patient did tolerate procedure well. Bilateral Breath Sounds: Clear, Diminished   Yes   Pt extubated per MD order and on 4L [place]. Cuff leak heard prior to procedure. RN, RT and family at bedside. Extubation tolerated well, BBS were clear/diminished. Pt able to state name.  Esperanza Sheets T 02/12/2019, 11:00 AM

## 2019-02-12 NOTE — Consult Note (Addendum)
CARDIOLOGY CONSULT NOTE  Patient ID: ARLEY SALAMONE MRN: [id number] DOB/AGE: 40-Mar-1982 40 y.o.  Admit date: 01/27/2019 Referring Physician: [facility] Reason for Consultation:  New diagnosis heart failure  HPI:   38 y.o. caucasian male  with schizophrenia, prolonged hospitalization with cellulitis (b/l wrists), new acute kidney injury, now with new diagnosis heart failure.  Patient was admitted to the hospital on 01/29/2019 after he was found wandering on the road with open wounds on his wrists. He was subsequently treated with appropriate antibiotics, including vancomycin. During hospitalization, he developed acute kidney injury, with Cr reaching up to 9.2, now trending down with continued improvement in urine output. On 02/11/2019, he had acute respiratory distress, necessitating intubation. Echocardiogram today showed EF 25-30%. Cardiology thus consulted.   At baseline. Patient works at [employer], lives with his fiance and two kids. He is not very forthcoming about his schizophrenia diagnosis, and does not appear to be taking any mediations for the same. He has a sedentary job, and denies any baseline dyspnea, orthopnea, PND, leg edema symptoms.   He has received large amounts of IV fluids through the hospitalization, as a part of his cellulitis, and AKI treatment.   Past Medical History:  Diagnosis Date  . Schizophrenia [facility])      Past Surgical History:  Procedure Laterality Date  . INCISION AND DRAINAGE OF WOUND Left 02/09/2019   Procedure: debridement of left wrist wound with Acell placement;  Surgeon: Cindra Presume, MD;  Location: [facility];  Service: Plastics;  Laterality: Left;  1 hour     History reviewed. No pertinent family history.   Social History: Social History   Socioeconomic History  . Marital status: Single    Spouse name: Not on file  . Number of children: Not on file  . Years of education: Not on file  . Highest education level: Not on file   Occupational History  . Not on file  Social Needs  . Financial resource strain: Not on file  . Food insecurity    Worry: Not on file    Inability: Not on file  . Transportation needs    Medical: Not on file    Non-medical: Not on file  Tobacco Use  . Smoking status: Unknown If Ever Smoked  Substance and Sexual Activity  . Alcohol use: Not on file  . Drug use: Not on file  . Sexual activity: Not on file  Lifestyle  . Physical activity    Days per week: Not on file    Minutes per session: Not on file  . Stress: Not on file  Relationships  . Social Herbalist on phone: Not on file    Gets together: Not on file    Attends religious service: Not on file    Active member of club or organization: Not on file    Attends meetings of clubs or organizations: Not on file    Relationship status: Not on file  . Intimate partner violence    Fear of current or ex partner: Not on file    Emotionally abused: Not on file    Physically abused: Not on file    Forced sexual activity: Not on file  Other Topics Concern  . Not on file  Social History Narrative  . Not on file     No medications prior to admission.    Review of Systems  Constitution: Negative for decreased appetite, malaise/fatigue, weight gain and weight loss.  HENT:  Negative for congestion.   Eyes: Negative for visual disturbance.  Cardiovascular: Negative for chest pain, dyspnea on exertion, leg swelling, palpitations and syncope.  Respiratory: Negative for cough.   Endocrine: Negative for cold intolerance.  Hematologic/Lymphatic: Does not bruise/bleed easily.  Skin: Negative for itching and rash.  Musculoskeletal: Negative for myalgias.  Gastrointestinal: Negative for abdominal pain, nausea and vomiting.  Genitourinary: Negative for dysuria.  Neurological: Negative for dizziness and weakness.  Psychiatric/Behavioral: The patient is not nervous/anxious.   All other systems reviewed and are negative.      Physical Exam: Physical Exam  Constitutional: He is oriented to person, place, and time. He appears well-developed and well-nourished. No distress.  HENT:  Head: Normocephalic and atraumatic.  Eyes: Pupils are equal, round, and reactive to light. Conjunctivae are normal.  Neck: No JVD present.  Cardiovascular: Normal rate, regular rhythm and intact distal pulses.  No murmur heard. Pulmonary/Chest: Effort normal and breath sounds normal. He has no wheezes. He has no rales.  Abdominal: Soft. Bowel sounds are normal. There is no rebound.  Musculoskeletal:        General: Edema (2+ b/l) present.     Comments: LUE in ACE bandage.  Lymphadenopathy:    He has no cervical adenopathy.  Neurological: He is alert and oriented to person, place, and time. No cranial nerve deficit.  Skin: Skin is warm and dry.  Psychiatric: He has a normal mood and affect.  Nursing note and vitals reviewed.    Labs:   Lab Results  Component Value Date   WBC 9.4 02/12/2019   HGB 9.9 (L) 02/12/2019   HCT 30.0 (L) 02/12/2019   MCV 88.5 02/12/2019   PLT 300 02/12/2019    Recent Labs  Lab 02/12/19 0718  NA 143  K 3.7  CL 106  CO2 27  BUN 29*  CREATININE 5.28*  CALCIUM 8.2*  GLUCOSE 93    Lipid Panel     Component Value Date/Time   TRIG 136 02/12/2019 0718    BNP (last 3 results) Recent Labs    02/11/19 0929  BNP >4,500.0*    HEMOGLOBIN A1C Lab Results  Component Value Date   HGBA1C 5.3 02/11/2019   MPG 105.41 02/11/2019    Radiology: Dg Chest Port 1 View  Result Date: 02/12/2019 CLINICAL DATA:  Endotracheal tube. EXAM: PORTABLE CHEST 1 VIEW COMPARISON:  February 11, 2019. FINDINGS: Stable cardiomediastinal silhouette. Endotracheal and nasogastric tubes are unchanged in position. No pneumothorax is noted. Improved bilateral lung opacities are noted. Small left pleural effusion is noted. Bony thorax is unremarkable. IMPRESSION: Stable support apparatus. Improved bilateral lung  opacities are noted. Small left pleural effusion is noted. No pneumothorax is noted. Electronically Signed   By: Marijo Conception M.D.   On: 02/12/2019 07:43   Dg Chest Port 1 View  Result Date: 02/11/2019 CLINICAL DATA:  Intubation. New onset renal failure with acute decompensation. EXAM: PORTABLE CHEST 1 VIEW COMPARISON:  Earlier today FINDINGS: New endotracheal tube with tip just below the clavicular heads. The orogastric tube at least reaches the stomach. Bilateral airspace disease with interval worsening. There is likely pleural fluid, especially on the left. Prominent cardiac size accentuated by technique. No visible pneumothorax. IMPRESSION: 1. Unremarkable hardware positioning. 2. Worsening pulmonary edema. Electronically Signed   By: Monte Fantasia M.D.   On: 02/11/2019 08:33   Dg Chest Port 1 View  Result Date: 02/11/2019 CLINICAL DATA:  Initial evaluation for worsening shortness of breath. EXAM: PORTABLE CHEST 1 VIEW  COMPARISON:  None available. FINDINGS: Cardiomegaly.  Mediastinal silhouette within normal limits. Lungs normally inflated. Diffuse vascular congestion with interstitial prominence, compatible with moderate diffuse pulmonary interstitial edema. No definite pleural effusion. No consolidative airspace disease. No pneumothorax. No acute osseous finding. IMPRESSION: Cardiomegaly with moderate diffuse pulmonary interstitial edema. Electronically Signed   By: Jeannine Boga M.D.   On: 02/11/2019 04:47   Dg Abd Portable 1v  Result Date: 02/11/2019 CLINICAL DATA:  Orogastric tube placement. EXAM: PORTABLE ABDOMEN - 1 VIEW COMPARISON:  None. FINDINGS: Nasogastric terminates at the body of the stomach. Left lower lobe airspace disease. No gross free intraperitoneal air or bowel obstruction. Only the upper abdomen is imaged. IMPRESSION: Nasogastric tube terminating at the body of the stomach. Electronically Signed   By: Abigail Miyamoto M.D.   On: 02/11/2019 10:44    Scheduled  Meds: . benztropine  1 mg Oral Daily  . chlorhexidine gluconate (MEDLINE KIT)  15 mL Mouth Rinse BID  . Chlorhexidine Gluconate Cloth  6 each Topical Daily  . enoxaparin (LOVENOX) injection  30 mg Subcutaneous Q24H  . furosemide  80 mg Intravenous BID  . haloperidol  5 mg Oral Daily  . insulin aspart  0-15 Units Subcutaneous Q4H  . LORazepam  2 mg Oral Daily  . mouth rinse  15 mL Mouth Rinse 10 times per day  . [START ON 02/13/2019] pantoprazole sodium  40 mg Per Tube Daily  . potassium chloride  40 mEq Oral Daily   Continuous Infusions: . sodium chloride 10 mL/hr at 02/12/19 1100   PRN Meds:.acetaminophen **OR** [DISCONTINUED] acetaminophen, bisacodyl, ondansetron **OR** [DISCONTINUED] ondansetron (ZOFRAN) IV, traZODone  CARDIAC STUDIES:  EKG 02/12/2019: Sinus tachycardia. Nonspecific ST-T abnormality.  Echocardiogram 02/11/2019:  1. Left ventricular ejection fraction, by visual estimation, is 25 to 30%. The left ventricle has severely decreased function. There is mildly increased left ventricular hypertrophy.  2. Diffuse hypokinesis Abnormal GLS -6.2.  3. Global right ventricle has moderately reduced systolic function.The right ventricular size is moderately enlarged. No increase in right ventricular wall thickness.  4. Left atrial size was normal.  5. Right atrial size was normal.  6. Trivial pericardial effusion is present.  7. The mitral valve is normal in structure. Trace mitral valve regurgitation. No evidence of mitral stenosis.  8. The tricuspid valve is normal in structure. Tricuspid valve regurgitation is trivial.  9. The aortic valve is tricuspid. Aortic valve regurgitation is trivial. Mild aortic valve sclerosis without stenosis. 10. The pulmonic valve was grossly normal. Pulmonic valve regurgitation is mild. 11. The inferior vena cava is normal in size with greater than 50% respiratory variability, suggesting right atrial pressure of 3 mmHg.   Assessment &  Recommendations:  38 y.o. caucasian male  with schizophrenia, prolonged hospitalization with cellulitis (b/l wrists), new acute kidney injury, now with new diagnosis heart failure.  Acute systolic heart failure: Etiology unclear at this time. Cellulitis, subsequent renal failure may be partly responsible. No other obvious causes evident from history, EKG, physical exam. Workup with cardiac MRI/coronary angiography not possible at this time due to renal dysfunction. I am hopeful that some of this may be reversible. He is diuresing very well with lasix 80 mg bid. Reasonable to continue for now. I would avoid beta blocker use at this time, given acute decompensation. Also, unable to use ivabradine due to severe renal dysfunction. Recommend adding Bidil 20-37.5 mg 1/2 tab tid for afterload reduction. Over the next few days, beta blocker/ivabradine could be introduced. Not a candidate  for ACEi/ARNI at this point.   Troponin elevation: Supply demand mismatch due to acute systolic heart failure.   Nigel Mormon, MD 02/12/2019, 12:21 PM [facility] Pager: [phone] Office: [phone] If no answer Cell [phone]

## 2019-02-12 NOTE — Progress Notes (Signed)
[facility] Progress Note    Assessment/ Plan:   1. AKI - severe, rapid rise creat. 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 Looks like Cr is finally downtrending with increase in UOP, peaked at 9.2.  Now on oral bicarb.  No indication for RRT.  Will continue to follow.  Cr 8.0--> 7.5--> 6.5--> 5.27.   - Augment UOP with IV Lasix 80 BID and follow - Follow labs   2. Schizophrenia - therapies per primary team   3. Acute hypoxic respiratory failure - mechanical vent per pulm    4. Bilat wrist wounds/ cellulitis - Rocephin- s/p debridement in OR with plastics 47/18.    5. Metabolic acidosis - improving; discontinue bicarbonate and follow BMP  6. Dispo: in ICU   Subjective:    He received lasix 80 mg IV BID yesterday and had 4.6 L UOP over 11/22 charted.  Hasn't had 5am labs drawn yet - spoke with nursing and they've called lab.  He has been more alert and following commands  Review of systems:  Unable to obtain 2/2 mechanical ventilation     Objective:   BP 113/78   Pulse 67   Temp 99.1 F (37.3 C) (Oral)   Resp (!) 24   Ht 6' 0.01" (1.829 m)   Wt 108.1 kg   SpO2 96%   BMI 32.31 kg/m   Intake/Output Summary (Last 24 hours) at 02/12/2019 0630 Last data filed at 02/12/2019 0600 Gross per 24 hour  Intake 880.15 ml  Output 4600 ml  Net -3719.85 ml   Weight change: -1.8 kg  Physical Exam:  Gen: in ICU, intubated CVS: S1S2 no rub Resp: clear but reduced  Abd: soft, nontender, NABS Ext: 2+ LE edema bilaterally, L wrist dressed Neuro - wakes with exam - on continuous sedation  GU foley in place  Imaging: Dg Chest Port 1 View  Result Date: 02/11/2019 CLINICAL DATA:  Intubation. New onset renal failure with acute decompensation. EXAM: PORTABLE CHEST 1 VIEW COMPARISON:  Earlier today FINDINGS: New endotracheal tube with tip just below the clavicular heads. The orogastric tube at least reaches the stomach. Bilateral airspace disease with interval worsening. There  is likely pleural fluid, especially on the left. Prominent cardiac size accentuated by technique. No visible pneumothorax. IMPRESSION: 1. Unremarkable hardware positioning. 2. Worsening pulmonary edema. Electronically Signed   By: Monte Fantasia M.D.   On: 02/11/2019 08:33   Dg Chest Port 1 View  Result Date: 02/11/2019 CLINICAL DATA:  Initial evaluation for worsening shortness of breath. EXAM: PORTABLE CHEST 1 VIEW COMPARISON:  None available. FINDINGS: Cardiomegaly.  Mediastinal silhouette within normal limits. Lungs normally inflated. Diffuse vascular congestion with interstitial prominence, compatible with moderate diffuse pulmonary interstitial edema. No definite pleural effusion. No consolidative airspace disease. No pneumothorax. No acute osseous finding. IMPRESSION: Cardiomegaly with moderate diffuse pulmonary interstitial edema. Electronically Signed   By: Jeannine Boga M.D.   On: 02/11/2019 04:47   Dg Abd Portable 1v  Result Date: 02/11/2019 CLINICAL DATA:  Orogastric tube placement. EXAM: PORTABLE ABDOMEN - 1 VIEW COMPARISON:  None. FINDINGS: Nasogastric terminates at the body of the stomach. Left lower lobe airspace disease. No gross free intraperitoneal air or bowel obstruction. Only the upper abdomen is imaged. IMPRESSION: Nasogastric tube terminating at the body of the stomach. Electronically Signed   By: Abigail Miyamoto M.D.   On: 02/11/2019 10:44    Labs: BMET Recent Labs  Lab 02/06/19 0443 02/07/19 0415 02/08/19 0502 02/09/19 0556 02/10/19 0458 02/11/19 0513 02/11/19  1572 02/12/19 0332  NA 140 142 142 141 143 141 141 141  K 3.1* 3.2* 3.3* 3.5 3.7 3.6 3.4* 3.4*  CL 105 108 105 104 105 105  --   --   CO2 21* '23 24 23 24 25  ' --   --   GLUCOSE 97 91 99 93 106* 109*  --   --   BUN 33* 30* 28* 27* 25* 24*  --   --   CREATININE 8.95* 8.61* 8.05* 7.56* 6.56* 5.27*  --   --   CALCIUM 8.0* 7.7* 8.1* 7.8* 8.0* 7.7*  --   --    CBC Recent Labs  Lab 02/06/19 0443  02/07/19 0415 02/11/19 0843 02/11/19 0929 02/12/19 0332  WBC 7.4 7.3  --  15.5*  --   NEUTROABS 4.7 4.5  --   --   --   HGB 11.7* 10.8* 12.9* 10.9* 11.9*  HCT 33.8* 31.6* 38.0* 32.7* 35.0*  MCV 84.9 85.2  --  88.4  --   PLT 288 301  --  353  --     Medications:    . benztropine  1 mg Oral Daily  . chlorhexidine gluconate (MEDLINE KIT)  15 mL Mouth Rinse BID  . Chlorhexidine Gluconate Cloth  6 each Topical Daily  . docusate  100 mg Per Tube BID  . enoxaparin (LOVENOX) injection  30 mg Subcutaneous Q24H  . furosemide  80 mg Intravenous BID  . haloperidol  5 mg Oral Daily  . insulin aspart  0-15 Units Subcutaneous Q4H  . LORazepam  2 mg Oral Daily  . mouth rinse  15 mL Mouth Rinse 10 times per day  . pantoprazole (PROTONIX) IV  40 mg Intravenous Daily  . sodium bicarbonate  650 mg Per Tube BID     Claudia Desanctis 02/12/2019, 6:30 AM

## 2019-02-12 NOTE — Progress Notes (Addendum)
NAME:  Tyrone Jenkins, MRN:  [id number], DOB:  1981/01/08, LOS: 78 ADMISSION DATE:  01/27/2019, CONSULTATION DATE:  02/11/19 REFERRING MD:  DR. Maylene Roes, CHIEF COMPLAINT:  Respiratory failure with hypoxia.    Brief History   38 yo M with possible vanc toxicity in setting of bilateral wrist cellulitis, developed respiratory distress overnight and was emergently intubated 11/22  History of present illness   38 yo M PMH schizophrenia who was found wandering 11/7 and noted to have bilateral wrist wounds. Initially IVC'd, development of concern for cellulitis of wrists with necrosis of L wrist tissue. Started on broad spectrum abx (vanc, clinda, zosyn). Developed acute renal failure-- concerning for vanc toxicity. Renal indices continue to gradually improve and patient has not required RRT. 11/20 patient underwent debridement and acell placement of L wrist with plastics. 11/22 overnight patient with rapidly decompensating respiratory status and overt pulmonary edema. ABG 7.456/ 36.4/ 53.5// 1.7/ 25.3/ 87.8  Transferred to ICU and intubated emergently   Past Medical History   has a past medical history of Schizophrenia ([place]).  Significant Hospital Events   11/7 presented 11/20 debridement and acell placement of L wrist wound 11/22 respiratory distress, transferred to ICU and emergently intubated   Consults:  Plastic surgery Psych Nephrology PCCM  Procedures:  11/20 L wrist debridement with acell placement by plastics 11/22 ETT >>>   Significant Diagnostic Tests:  11/22 - CXR -    Micro Data:  11/17 - SARS CoV2 - neg.   Antimicrobials:  Previously vanc, clinda, zosyn, rocephin. None as of 11/22  Interim history/subjective:  Patient responds to voice. Able to shake/nod appropriately.  States he is not in any pain right now.    Objective   Blood pressure 109/81, pulse 80, temperature 98.8 F (37.1 C), temperature source Oral, resp. rate (!) 24, height 6' 0.01" (1.829 m), weight  108.1 kg, SpO2 100 %.    Vent Mode: PRVC FiO2 (%):  [50 %-100 %] 50 % Set Rate:  [24 bmp-28 bmp] 24 bmp Vt Set:  [620 mL] 620 mL PEEP:  [8 cmH20-12 cmH20] 12 cmH20 Plateau Pressure:  [20 cmH20-25 cmH20] 23 cmH20   Intake/Output Summary (Last 24 hours) at 02/12/2019 0759 Last data filed at 02/12/2019 0700 Gross per 24 hour  Intake 904.34 ml  Output 4800 ml  Net -3895.66 ml   Filed Weights   02/10/19 0452 02/11/19 0356 02/12/19 0200  Weight: 110.9 kg 109.9 kg 108.1 kg    Examination: General: alert. Responds to voice. Opens eyes. Shakes/nods head appropriately.  HENT: moist oral mucosa.  Lungs: LCTAB. No wheezes, no crackles.  Cardiovascular: regular rhythm. Normal rate. No murmurs.  Abdomen: soft, nontender. Normal bowel sounds.  Extremities: 3+ pitting edema on the feet bilaterally.  Pt has periodic tremors of the arms and legs bilaterally.  Neuro: no ankle clonus.   GU: foley cath in place.   Resolved Hospital Problem list     Assessment & Plan:  Acute hypoxic respiratory failure - secondary to pulmonary edema from acute kidney failure and acute HF (EF 25-30% w/ severely decreased LV function. ). CXR shows improving opacities.  ABG on 11/23: 7.56 pH, CO2 30.  - intubated: PRVC mode. 50% FiO2, PEEP 12. RR 24.    - daily CXR - goal SpO2 > 92% -pulm hygeine - fentanyl gtt - diuresis  Acute renal failure - nephrology following. No hx of kidney disease. Thought to be AKI from vanc nephrotoxicity.  Cr downtrending.  - diuresis: 80 IV  lasix BID.  Responding well.  - strict I/O - daily BMP - avoid nephrotoxic agents  Acute CHF - no hx of CHF. Echo on 11/22 showed LVEF of 25-30% with diffuse hypokinesis. - consider cardiology consult.    L wrist cellulitis with necrosis - s/p Abx and debridement w/ plastics on 11/20.  - acell per plastics  Schizophrenia - chronic.  - supportive care.  - continue psych meds:haldol qd, ativan qd, benzotropine   Best practice:  Diet:  NPO Pain/Anxiety/Delirium protocol (if indicated): fentanyl VAP protocol (if indicated): yes DVT prophylaxis: lovenox GI prophylaxis: protonix Glucose control: SSI Mobility: BR Code Status: full Family Communication: pending Disposition: ICU  Labs   CBC: Recent Labs  Lab 02/06/19 0443 02/07/19 0415 02/11/19 0843 02/11/19 0929 02/12/19 0332  WBC 7.4 7.3  --  15.5*  --   NEUTROABS 4.7 4.5  --   --   --   HGB 11.7* 10.8* 12.9* 10.9* 11.9*  HCT 33.8* 31.6* 38.0* 32.7* 35.0*  MCV 84.9 85.2  --  88.4  --   PLT 288 301  --  353  --     Basic Metabolic Panel: Recent Labs  Lab 02/07/19 0415 02/08/19 0502 02/09/19 0556 02/10/19 0458 02/11/19 0513 02/11/19 0843 02/12/19 0332  NA 142 142 141 143 141 141 141  K 3.2* 3.3* 3.5 3.7 3.6 3.4* 3.4*  CL 108 105 104 105 105  --   --   CO2 23 24 23 24 25   --   --   GLUCOSE 91 99 93 106* 109*  --   --   BUN 30* 28* 27* 25* 24*  --   --   CREATININE 8.61* 8.05* 7.56* 6.56* 5.27*  --   --   CALCIUM 7.7* 8.1* 7.8* 8.0* 7.7*  --   --   MG  --  1.8  --   --   --   --   --    GFR: Estimated Creatinine Clearance: 23.9 mL/min (A) (by C-G formula based on SCr of 5.27 mg/dL (H)). Recent Labs  Lab 02/06/19 0443 02/07/19 0415 02/11/19 0929  WBC 7.4 7.3 15.5*    Liver Function Tests: No results for input(s): AST, ALT, ALKPHOS, BILITOT, PROT, ALBUMIN in the last 168 hours. No results for input(s): LIPASE, AMYLASE in the last 168 hours. Recent Labs  Lab 02/11/19 0929  AMMONIA 44*    ABG    Component Value Date/Time   PHART 7.563 (H) 02/12/2019 0332   PCO2ART 30.4 (L) 02/12/2019 0332   PO2ART 86.0 02/12/2019 0332   HCO3 27.4 02/12/2019 0332   TCO2 28 02/12/2019 0332   ACIDBASEDEF 3.0 (H) 02/11/2019 0843   O2SAT 98.0 02/12/2019 0332     Coagulation Profile: No results for input(s): INR, PROTIME in the last 168 hours.  Cardiac Enzymes: No results for input(s): CKTOTAL, CKMB, CKMBINDEX, TROPONINI in the last 168 hours.   HbA1C: Hgb A1c MFr Bld  Date/Time Value Ref Range Status  02/11/2019 07:59 AM 5.3 4.8 - 5.6 % Final    Comment:    (NOTE) Pre diabetes:          5.7%-6.4% Diabetes:              >6.4% Glycemic control for   <7.0% adults with diabetes     CBG: Recent Labs  Lab 02/11/19 1629 02/11/19 1947 02/11/19 2337 02/12/19 0340 02/12/19 0718  GLUCAP 96 97 90 89 87    Review of Systems:   Unable to  attain d/t pt condition.   Past Medical History  He,  has a past medical history of Schizophrenia ([facility]).   Surgical History   History reviewed. No pertinent surgical history.   Social History      Family History   His family history is not on file.   Allergies No Known Allergies   Home Medications  Prior to Admission medications   Not on File     Critical care time: 20

## 2019-02-12 NOTE — Progress Notes (Signed)
CRITICAL VALUE ALERT  Critical Value:  Troponin 289  Date & Time Notied:  02/12/2019 1338  Provider Notified: Dr. Halford Chessman  Orders Received/Actions taken: none at this time.

## 2019-02-12 NOTE — Progress Notes (Signed)
3 Days Post-Op  Subjective: Tyrone Jenkins resting in bed on evaluation, intubated and on vent for pulmonary edema which began 11/22 overnight.  No complaints in regards to left wrist and right wrist. No pain. He tolerated dressing change well. No fever, chills.  Objective: Vital signs in last 24 hours: Temp:  [98.2 F (36.8 C)-99.1 F (37.3 C)] 98.8 F (37.1 C) (11/23 0722) Pulse Rate:  [66-97] 72 (11/23 1000) Resp:  [19-28] 24 (11/23 1000) BP: (100-124)/(74-102) 118/102 (11/23 1000) SpO2:  [91 %-100 %] 96 % (11/23 1024) FiO2 (%):  [40 %-100 %] 40 % (11/23 1024) Weight:  [108.1 kg] 108.1 kg (11/23 0200) Last BM Date: 02/11/19  Intake/Output from previous day: 11/22 0701 - 11/23 0700 In: 904.3 [I.V.:784.3; NG/GT:70; IV Piggyback:50.1] Out: 4800 [Urine:4800] Intake/Output this shift: Total I/O In: 59.9 [I.V.:59.9] Out: 800 [Urine:800]  General appearance: alert, cooperative, no distress. Intubated  Head: Normocephalic, without obvious abnormality, atraumatic Resp: symmetric rise and fall, unlabored, intubated Extremities: RUE wrapped with ACE, mitten over hand. Pulses: 2+ and symmetric Incision/Wound: Left wrist wound with sorbact in place, slightly dry. No surrounding erythema. 2+ pulse. Minimal swelling noted. Acell incorporating.  Lab Results:  CBC    Component Value Date/Time   WBC 9.4 02/12/2019 0718   RBC 3.39 (L) 02/12/2019 0718   HGB 9.9 (L) 02/12/2019 0718   HCT 30.0 (L) 02/12/2019 0718   PLT 300 02/12/2019 0718   MCV 88.5 02/12/2019 0718   MCH 29.2 02/12/2019 0718   MCHC 33.0 02/12/2019 0718   RDW 13.6 02/12/2019 0718   LYMPHSABS 1.6 02/07/2019 0415   MONOABS 1.0 02/07/2019 0415   EOSABS 0.2 02/07/2019 0415   BASOSABS 0.1 02/07/2019 0415    BMET Recent Labs    02/11/19 0513  02/12/19 0332 02/12/19 0718  NA 141   < > 141 143  K 3.6   < > 3.4* 3.7  CL 105  --   --  106  CO2 25  --   --  27  GLUCOSE 109*  --   --  93  BUN 24*  --   --  29*   CREATININE 5.27*  --   --  5.28*  CALCIUM 7.7*  --   --  8.2*   < > = values in this interval not displayed.   PT/INR No results for input(s): LABPROT, INR in the last 72 hours. ABG Recent Labs    02/11/19 0843 02/12/19 0332  PHART 7.235* 7.563*  HCO3 24.8 27.4    Studies/Results: Dg Chest Port 1 View  Result Date: 02/12/2019 CLINICAL DATA:  Endotracheal tube. EXAM: PORTABLE CHEST 1 VIEW COMPARISON:  February 11, 2019. FINDINGS: Stable cardiomediastinal silhouette. Endotracheal and nasogastric tubes are unchanged in position. No pneumothorax is noted. Improved bilateral lung opacities are noted. Small left pleural effusion is noted. Bony thorax is unremarkable. IMPRESSION: Stable support apparatus. Improved bilateral lung opacities are noted. Small left pleural effusion is noted. No pneumothorax is noted. Electronically Signed   By: Marijo Conception M.D.   On: 02/12/2019 07:43   Dg Chest Port 1 View  Result Date: 02/11/2019 CLINICAL DATA:  Intubation. New onset renal failure with acute decompensation. EXAM: PORTABLE CHEST 1 VIEW COMPARISON:  Earlier today FINDINGS: New endotracheal tube with tip just below the clavicular heads. The orogastric tube at least reaches the stomach. Bilateral airspace disease with interval worsening. There is likely pleural fluid, especially on the left. Prominent cardiac size accentuated by technique. No visible pneumothorax. IMPRESSION:  1. Unremarkable hardware positioning. 2. Worsening pulmonary edema. Electronically Signed   By: Marnee Spring M.D.   On: 02/11/2019 08:33   Dg Chest Port 1 View  Result Date: 02/11/2019 CLINICAL DATA:  Initial evaluation for worsening shortness of breath. EXAM: PORTABLE CHEST 1 VIEW COMPARISON:  None available. FINDINGS: Cardiomegaly.  Mediastinal silhouette within normal limits. Lungs normally inflated. Diffuse vascular congestion with interstitial prominence, compatible with moderate diffuse pulmonary interstitial edema.  No definite pleural effusion. No consolidative airspace disease. No pneumothorax. No acute osseous finding. IMPRESSION: Cardiomegaly with moderate diffuse pulmonary interstitial edema. Electronically Signed   By: Rise Mu M.D.   On: 02/11/2019 04:47   Dg Abd Portable 1v  Result Date: 02/11/2019 CLINICAL DATA:  Orogastric tube placement. EXAM: PORTABLE ABDOMEN - 1 VIEW COMPARISON:  None. FINDINGS: Nasogastric terminates at the body of the stomach. Left lower lobe airspace disease. No gross free intraperitoneal air or bowel obstruction. Only the upper abdomen is imaged. IMPRESSION: Nasogastric tube terminating at the body of the stomach. Electronically Signed   By: Jeronimo Greaves M.D.   On: 02/11/2019 10:44    Anti-infectives: Anti-infectives (From admission, onward)   Start     Dose/Rate Route Frequency Ordered Stop   02/01/19 1000  cefTRIAXone (ROCEPHIN) 1 g in sodium chloride 0.9 % 100 mL IVPB  Status:  Discontinued     1 g 200 mL/hr over 30 Minutes Intravenous Every 24 hours 01/31/19 1210 02/09/19 1154   01/31/19 1300  metroNIDAZOLE (FLAGYL) IVPB 500 mg  Status:  Discontinued     500 mg 100 mL/hr over 60 Minutes Intravenous Every 8 hours 01/31/19 1210 02/01/19 0750   01/31/19 1000  vancomycin (VANCOCIN) 1,250 mg in sodium chloride 0.9 % 250 mL IVPB  Status:  Discontinued     1,250 mg 166.7 mL/hr over 90 Minutes Intravenous Every 12 hours 01/30/19 0053 01/30/19 1058   01/31/19 0941  vancomycin variable dose per unstable renal function (pharmacist dosing)  Status:  Discontinued      Does not apply See admin instructions 01/31/19 0942 01/31/19 1206   01/31/19 0600  vancomycin (VANCOCIN) 2,000 mg in sodium chloride 0.9 % 500 mL IVPB  Status:  Discontinued     2,000 mg 250 mL/hr over 120 Minutes Intravenous Every 24 hours 01/30/19 1058 01/31/19 0941   01/29/19 2115  vancomycin (VANCOCIN) 2,000 mg in sodium chloride 0.9 % 500 mL IVPB  Status:  Discontinued     2,000 mg 250 mL/hr over  120 Minutes Intravenous  Once 01/29/19 2100 01/30/19 0145   01/29/19 2115  clindamycin (CLEOCIN) IVPB 900 mg  Status:  Discontinued     900 mg 100 mL/hr over 30 Minutes Intravenous  Once 01/29/19 2100 01/30/19 0027   01/29/19 2115  piperacillin-tazobactam (ZOSYN) IVPB 3.375 g     3.375 g 100 mL/hr over 30 Minutes Intravenous  Once 01/29/19 2100 01/30/19 0027   01/29/19 2000  cefTRIAXone (ROCEPHIN) injection 250 mg  Status:  Discontinued     250 mg Intramuscular Every 24 hours 01/29/19 1931 01/29/19 2058   01/28/19 1400  cephALEXin (KEFLEX) capsule 500 mg  Status:  Discontinued     500 mg Oral Every 8 hours 01/28/19 1308 01/29/19 1931      Assessment/Plan: s/p Procedure(s): debridement of left wrist wound with Acell placement  Wound is incorporating Acell, doing well. No sign of infection.  Dressing removed today, placed new KY jelly/surgilube, 4x4 gauze, kerlix, ace. Daily dressing changes as described above to keep Acell  moist. Avoid removing green sorbact, which is sutured in place.  Optimize nutritional status.   Call with questions or concerns.   LOS: 13 days    Leslee HomeMatthew J Taylinn Brabant, PA-C 02/12/2019

## 2019-02-13 ENCOUNTER — Encounter (HOSPITAL_COMMUNITY): Payer: Self-pay

## 2019-02-13 ENCOUNTER — Other Ambulatory Visit: Payer: Self-pay | Admitting: Cardiology

## 2019-02-13 DIAGNOSIS — I5021 Acute systolic (congestive) heart failure: Secondary | ICD-10-CM

## 2019-02-13 DIAGNOSIS — L03114 Cellulitis of left upper limb: Secondary | ICD-10-CM

## 2019-02-13 DIAGNOSIS — T368X5A Adverse effect of other systemic antibiotics, initial encounter: Secondary | ICD-10-CM

## 2019-02-13 LAB — BASIC METABOLIC PANEL
Anion gap: 13 (ref 5–15)
BUN: 28 mg/dL — ABNORMAL HIGH (ref 6–20)
CO2: 32 mmol/L (ref 22–32)
Calcium: 8.3 mg/dL — ABNORMAL LOW (ref 8.9–10.3)
Chloride: 97 mmol/L — ABNORMAL LOW (ref 98–111)
Creatinine, Ser: 4.7 mg/dL — ABNORMAL HIGH (ref 0.61–1.24)
GFR calc Af Amer: 17 mL/min — ABNORMAL LOW (ref >60)
GFR calc non Af Amer: 15 mL/min — ABNORMAL LOW (ref >60)
Glucose, Bld: 101 mg/dL — ABNORMAL HIGH (ref 70–99)
Potassium: 3.3 mmol/L — ABNORMAL LOW (ref 3.5–5.1)
Sodium: 142 mmol/L (ref 135–145)

## 2019-02-13 LAB — POTASSIUM: Potassium: 3.5 mmol/L (ref 3.5–5.1)

## 2019-02-13 LAB — CBC
HCT: 31.7 % — ABNORMAL LOW (ref 39.0–52.0)
Hemoglobin: 10.6 g/dL — ABNORMAL LOW (ref 13.0–17.0)
MCH: 29.8 pg (ref 26.0–34.0)
MCHC: 33.4 g/dL (ref 30.0–36.0)
MCV: 89 fL (ref 80.0–100.0)
Platelets: 320 10*3/uL (ref 150–400)
RBC: 3.56 MIL/uL — ABNORMAL LOW (ref 4.22–5.81)
RDW: 13.5 % (ref 11.5–15.5)
WBC: 7.9 10*3/uL (ref 4.0–10.5)
nRBC: 0 % (ref 0.0–0.2)

## 2019-02-13 LAB — GLUCOSE, CAPILLARY
Glucose-Capillary: 100 mg/dL — ABNORMAL HIGH (ref 70–99)
Glucose-Capillary: 118 mg/dL — ABNORMAL HIGH (ref 70–99)
Glucose-Capillary: 119 mg/dL — ABNORMAL HIGH (ref 70–99)
Glucose-Capillary: 124 mg/dL — ABNORMAL HIGH (ref 70–99)
Glucose-Capillary: 90 mg/dL (ref 70–99)
Glucose-Capillary: 95 mg/dL (ref 70–99)

## 2019-02-13 MED ORDER — LORAZEPAM 0.5 MG PO TABS: 0.5000 mg | ORAL_TABLET | Freq: Every day | ORAL | Status: DC

## 2019-02-13 MED ORDER — ORAL CARE MOUTH RINSE
15.0000 mL | Freq: Two times a day (BID) | OROMUCOSAL | Status: DC
  Administered 2019-02-13 – 2019-02-14 (×3): 15 mL via OROMUCOSAL

## 2019-02-13 MED ORDER — POTASSIUM CHLORIDE CRYS ER 20 MEQ PO TBCR
20.0000 meq | EXTENDED_RELEASE_TABLET | Freq: Two times a day (BID) | ORAL | Status: AC
  Administered 2019-02-13 (×2): 20 meq via ORAL
  Filled 2019-02-13 (×2): qty 1

## 2019-02-13 MED ORDER — POTASSIUM CHLORIDE CRYS ER 20 MEQ PO TBCR
20.0000 meq | EXTENDED_RELEASE_TABLET | Freq: Once | ORAL | Status: DC
  Filled 2019-02-13: qty 1

## 2019-02-13 MED ORDER — LORAZEPAM 1 MG PO TABS
1.0000 mg | ORAL_TABLET | Freq: Every day | ORAL | Status: DC
  Administered 2019-02-14: 1 mg via ORAL
  Filled 2019-02-13: qty 1

## 2019-02-13 NOTE — Progress Notes (Signed)
Subjective:  Feeling much better. Breathing improved.  No arrhythmia on telemetry.  Objective:  Vital Signs in the last 24 hours: Temp:  [98.1 F (36.7 C)-98.4 F (36.9 C)] 98.4 F (36.9 C) (11/24 0700) Pulse Rate:  [62-117] 80 (11/24 0900) Resp:  [12-24] 21 (11/24 0900) BP: (112-146)/(73-109) 119/73 (11/24 0900) SpO2:  [88 %-99 %] 96 % (11/24 0900) FiO2 (%):  [40 %] 40 % (11/23 1024) Weight:  [98.1 kg] 98.1 kg (11/24 0500)  Intake/Output from previous day: 11/23 0701 - 11/24 0700 In: 430.8 [P.O.:350; I.V.:80.8] Out: 8250 [Urine:8250]  Physical Exam Constitutional: He is oriented to person, place, and time. He appears well-developed and well-nourished. No distress.  HENT:  Head: Normocephalic and atraumatic.  Eyes: Pupils are equal, round, and reactive to light. Conjunctivae are normal.  Neck: No JVD present.  Cardiovascular: Normal rate, regular rhythm and intact distal pulses.  No murmur heard. Pulmonary/Chest: Effort normal and breath sounds normal. He has no wheezes. He has no rales.  Abdominal: Soft. Bowel sounds are normal. There is no rebound.  Musculoskeletal:        General: Trace b/l edema.     Comments: LUE in ACE bandage.  Lymphadenopathy:    He has no cervical adenopathy.  Neurological: He is alert and oriented to person, place, and time. No cranial nerve deficit.  Skin: Skin is warm and dry.  Psychiatric: He has a normal mood and affect.  Nursing note and vitals reviewed.   Lab Results: BMP Recent Labs    02/11/19 0513  02/12/19 0332 02/12/19 0718 02/13/19 0226  NA 141   < > 141 143 142  K 3.6   < > 3.4* 3.7 3.3*  CL 105  --   --  106 97*  CO2 25  --   --  27 32  GLUCOSE 109*  --   --  93 101*  BUN 24*  --   --  29* 28*  CREATININE 5.27*  --   --  5.28* 4.70*  CALCIUM 7.7*  --   --  8.2* 8.3*  GFRNONAA 13*  --   --  13* 15*  GFRAA 15*  --   --  15* 17*   < > = values in this interval not displayed.    CBC Recent Labs  Lab  02/07/19 0415  02/13/19 0226  WBC 7.3   < > 7.9  RBC 3.71*   < > 3.56*  HGB 10.8*   < > 10.6*  HCT 31.6*   < > 31.7*  PLT 301   < > 320  MCV 85.2   < > 89.0  MCH 29.1   < > 29.8  MCHC 34.2   < > 33.4  RDW 12.6   < > 13.5  LYMPHSABS 1.6  --   --   MONOABS 1.0  --   --   EOSABS 0.2  --   --   BASOSABS 0.1  --   --    < > = values in this interval not displayed.    HEMOGLOBIN A1C Lab Results  Component Value Date   HGBA1C 5.3 02/11/2019   MPG 105.41 02/11/2019    Cardiac Panel (last 3 results) No results for input(s): CKTOTAL, CKMB, TROPONINI, RELINDX in the last 8760 hours.  BNP (last 3 results) Recent Labs    02/11/19 0929  BNP >4,500.0*    TSH No results for input(s): TSH in the last 8760 hours.  Lipid Panel  Component Value Date/Time   TRIG 136 02/12/2019 0718     Hepatic Function Panel Recent Labs    01/27/19 0919 01/29/19 1501  PROT 7.7 7.0  ALBUMIN 5.0 4.3  AST 34 29  ALT 25 23  ALKPHOS 57 56  BILITOT 3.6* 2.2*   CARDIAC STUDIES:  EKG 02/12/2019: Sinus tachycardia. Nonspecific ST-T abnormality.  Echocardiogram 02/11/2019: 1. Left ventricular ejection fraction, by visual estimation, is 25 to 30%. The left ventricle has severely decreased function. There is mildly increased left ventricular hypertrophy. 2. Diffuse hypokinesis Abnormal GLS -6.2. 3. Global right ventricle has moderately reduced systolic function.The right ventricular size is moderately enlarged. No increase in right ventricular wall thickness. 4. Left atrial size was normal. 5. Right atrial size was normal. 6. Trivial pericardial effusion is present. 7. The mitral valve is normal in structure. Trace mitral valve regurgitation. No evidence of mitral stenosis. 8. The tricuspid valve is normal in structure. Tricuspid valve regurgitation is trivial. 9. The aortic valve is tricuspid. Aortic valve regurgitation is trivial. Mild aortic valve sclerosis without  stenosis. 10. The pulmonic valve was grossly normal. Pulmonic valve regurgitation is mild. 11. The inferior vena cava is normal in size with greater than 50% respiratory variability, suggesting right atrial pressure of 3 mmHg.   Assessment & Recommendations:  38 y.o. caucasian male  with schizophrenia, prolonged hospitalization with cellulitis (b/l wrists), new acute kidney injury, now with new diagnosis heart failure.  Acute systolic heart failure: Etiology unclear at this time. Cellulitis, subsequent renal failure may be partly responsible. No other obvious causes evident from history, EKG, physical exam. Workup with cardiac MRI/coronary angiography not possible at this time due to renal dysfunction.  Patient is diuresed over 13 L in 2 days.  His euvolemic at this time. Improved opacities on chest Xray.  I am hopeful that some of his systolic dysfunction may be related to acute illness and could be reversible.  Consider holding Lasix.  From cardiac standpoint, recommend continuing BiDil.  He could be given Lasix for as needed use on discharge.  If no other issues for continued hospitalization, I am hopeful that he could be discharged as early as tomorrow or day after.  Will arrange outpatient follow-up and repeat echocardiogram consider further work-up management function improves.  Troponin elevation: Supply demand mismatch due to acute systolic heart failure.    Nigel Mormon, M.D. 02/13/2019, 10:22 AM [facility] Pager: [phone] Office: [phone] If no answer: [phone]

## 2019-02-13 NOTE — Progress Notes (Signed)
[facility] Progress Note    Assessment/ Plan:   1. AKI - severe, rapid rise creat. 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 Looks like Cr is finally downtrending with increase in UOP, peaked at 9.2.  Now on oral bicarb.  No indication for RRT.  Will continue to follow.  Cr 8.0--> 7.5--> 6.5--> 5.27.   - Continue supportive care  - Post ATN diuresis as well as augmented by lasix  - For now, discontinue scheduled lasix and reassess tomorrow. Robust output on 11/23 and sats 97% on 2.5 liters on exam with urine in foley - Discontinue scheduled K - supplement potassium once today; repeat K later today  2. Schizophrenia - therapies per primary team   3. Acute hypoxic respiratory failure - extubated; on supportive measures   4. Bilat wrist wounds/ cellulitis - Rocephin- s/p debridement in OR with plastics 95/63.    5. Metabolic acidosis - resolved and off of bicarb   6. Dispo: in ICU   Subjective:    He received lasix 80 mg IV BID yesterday and had 8.3 liters UOP over 11/23 charted.  He was extubated yesterday.  Asks if can have potassium in a pill   Review of systems:   Denies shortness of breath  Reports nausea and vomiting with K supplement yesterday     Objective:   BP 123/82 (BP Location: Left Arm)   Pulse 66   Temp 98.3 F (36.8 C) (Oral)   Resp 18   Ht 6' 0.01" (1.829 m)   Wt 98.1 kg   SpO2 92%   BMI 29.32 kg/m   Intake/Output Summary (Last 24 hours) at 02/13/2019 8756 Last data filed at 02/13/2019 0600 Gross per 24 hour  Intake 454.98 ml  Output 8450 ml  Net -7995.02 ml   Weight change: -10 kg  Physical Exam:  Gen: adult male in bed in NAD  CVS: S1S2 no rub Resp: clear to auscultation; unlabored; on 2.5 liters with sat 97% Abd: soft, nontender; obese habitus  Ext: 1+ LE edema bilaterall Neuro - alert and oriented x 3; follows commands Psych normal mood and affect  GU foley in place  Imaging: Dg Chest Port 1 View  Result Date:  02/12/2019 CLINICAL DATA:  Endotracheal tube. EXAM: PORTABLE CHEST 1 VIEW COMPARISON:  February 11, 2019. FINDINGS: Stable cardiomediastinal silhouette. Endotracheal and nasogastric tubes are unchanged in position. No pneumothorax is noted. Improved bilateral lung opacities are noted. Small left pleural effusion is noted. Bony thorax is unremarkable. IMPRESSION: Stable support apparatus. Improved bilateral lung opacities are noted. Small left pleural effusion is noted. No pneumothorax is noted. Electronically Signed   By: Marijo Conception M.D.   On: 02/12/2019 07:43   Dg Chest Port 1 View  Result Date: 02/11/2019 CLINICAL DATA:  Intubation. New onset renal failure with acute decompensation. EXAM: PORTABLE CHEST 1 VIEW COMPARISON:  Earlier today FINDINGS: New endotracheal tube with tip just below the clavicular heads. The orogastric tube at least reaches the stomach. Bilateral airspace disease with interval worsening. There is likely pleural fluid, especially on the left. Prominent cardiac size accentuated by technique. No visible pneumothorax. IMPRESSION: 1. Unremarkable hardware positioning. 2. Worsening pulmonary edema. Electronically Signed   By: Monte Fantasia M.D.   On: 02/11/2019 08:33   Dg Abd Portable 1v  Result Date: 02/11/2019 CLINICAL DATA:  Orogastric tube placement. EXAM: PORTABLE ABDOMEN - 1 VIEW COMPARISON:  None. FINDINGS: Nasogastric terminates at the body of the stomach. Left lower lobe airspace disease. No gross  free intraperitoneal air or bowel obstruction. Only the upper abdomen is imaged. IMPRESSION: Nasogastric tube terminating at the body of the stomach. Electronically Signed   By: Abigail Miyamoto M.D.   On: 02/11/2019 10:44    Labs: BMET Recent Labs  Lab 02/07/19 0415 02/08/19 0502 02/09/19 0556 02/10/19 0458 02/11/19 0513 02/11/19 0843 02/12/19 0332 02/12/19 0718 02/13/19 0226  NA 142 142 141 143 141 141 141 143 142  K 3.2* 3.3* 3.5 3.7 3.6 3.4* 3.4* 3.7 3.3*  CL  108 105 104 105 105  --   --  106 97*  CO2 _0 --   --  27 32  GLUCOSE 91 99 93 106* 109*  --   --  93 101*  BUN 30* 28* 27* 25* 24*  --   --  29* 28*  CREATININE 8.61* 8.05* 7.56* 6.56* 5.27*  --   --  5.28* 4.70*  CALCIUM 7.7* 8.1* 7.8* 8.0* 7.7*  --   --  8.2* 8.3*  PHOS  --   --   --   --   --   --   --  3.1  --    CBC Recent Labs  Lab 02/07/19 0415  02/11/19 0929 02/12/19 0332 02/12/19 0718 02/13/19 0226  WBC 7.3  --  15.5*  --  9.4 7.9  NEUTROABS 4.5  --   --   --   --   --   HGB 10.8*   < > 10.9* 11.9* 9.9* 10.6*  HCT 31.6*   < > 32.7* 35.0* 30.0* 31.7*  MCV 85.2  --  88.4  --  88.5 89.0  PLT 301  --  353  --  300 320   < > = values in this interval not displayed.    Medications:    . benztropine  1 mg Oral Daily  . chlorhexidine gluconate (MEDLINE KIT)  15 mL Mouth Rinse BID  . Chlorhexidine Gluconate Cloth  6 each Topical Daily  . enoxaparin (LOVENOX) injection  30 mg Subcutaneous Q24H  . furosemide  80 mg Intravenous BID  . haloperidol  5 mg Oral Daily  . insulin aspart  0-15 Units Subcutaneous Q4H  . isosorbide-hydrALAZINE  0.5 tablet Oral TID  . LORazepam  2 mg Oral Daily  . mouth rinse  15 mL Mouth Rinse 10 times per day  . pantoprazole sodium  40 mg Per Tube Daily  . potassium chloride  40 mEq Oral Daily     Claudia Desanctis 02/13/2019, 6:37 AM

## 2019-02-13 NOTE — Plan of Care (Signed)
TRH pickup from PCCM on 11/25.  See [facility] communication for further details.

## 2019-02-13 NOTE — Progress Notes (Signed)
NAME:  Tyrone Jenkins, MRN:  [id number], DOB:  [birth year], LOS: 2 ADMISSION DATE:  01/27/2019, CONSULTATION DATE:  02/11/19 REFERRING MD:  DR. Maylene Roes, CHIEF COMPLAINT:  Respiratory failure with hypoxia.    Brief History   38 yo M with possible vanc toxicity in setting of bilateral wrist cellulitis, developed respiratory distress overnight and was emergently intubated 11/22  History of present illness   38 yo M PMH schizophrenia who was found wandering 11/7 and noted to have bilateral wrist wounds. Initially IVC'd, development of concern for cellulitis of wrists with necrosis of L wrist tissue. Started on broad spectrum abx (vanc, clinda, zosyn). Developed acute renal failure-- concerning for vanc toxicity. Renal indices continue to gradually improve and patient has not required RRT. 11/20 patient underwent debridement and acell placement of L wrist with plastics. 11/22 overnight patient with rapidly decompensating respiratory status and overt pulmonary edema. ABG 7.456/ 36.4/ 53.5// 1.7/ 25.3/ 87.8  Transferred to ICU and intubated emergently   Past Medical History   has a past medical history of Schizophrenia ([place]).  Significant Hospital Events   11/7 presented 11/20 debridement and acell placement of L wrist wound 11/22 respiratory distress, transferred to ICU and emergently intubated   Consults:  Plastic surgery Psych Nephrology PCCM  Procedures:  11/20 L wrist debridement with acell placement by plastics 11/22 ETT >>>   Significant Diagnostic Tests:  11/22 - CXR -    Micro Data:  11/17 - SARS CoV2 - neg.   Antimicrobials:  Previously vanc, clinda, zosyn, rocephin. None as of 11/22  Interim history/subjective:  Patient states he feels better today. Denies headache, chest pain, or abdominal pain. States breathing is improved. Still unsure what bit him to cause his cellulitis. Pt had questions about why he was receiving certain medications and about what caused his heart  failure. These issues were explained to him.    Objective   Blood pressure 123/83, pulse 88, temperature 98.4 F (36.9 C), temperature source Oral, resp. rate 16, height 6' 0.01" (1.829 m), weight 98.1 kg, SpO2 94 %.    Vent Mode: PSV;CPAP FiO2 (%):  [40 %] 40 % PEEP:  [5 cmH20] 5 cmH20 Pressure Support:  [10 cmH20] 10 cmH20   Intake/Output Summary (Last 24 hours) at 02/13/2019 0739 Last data filed at 02/13/2019 0600 Gross per 24 hour  Intake 430.79 ml  Output 8250 ml  Net -7819.21 ml   Filed Weights   02/11/19 0356 02/12/19 0200 02/13/19 0500  Weight: 109.9 kg 108.1 kg 98.1 kg    Examination: General: alert and oriented. No acute distress.    HENT: moist oral mucosa. NCAT Lungs: LCTAB. No crackles or wheezes.  Cardiovascular:regular rhythm. Normal rate.  Abdomen: soft, nontender. Normal bowel sounds.   Extremities: 1+ pitting edema of the lower extremities. Improved from yesterday.   GU: foley cath in place.   Resolved Hospital Problem list     Assessment & Plan:  Acute hypoxic respiratory failure - secondary to pulmonary edema from acute kidney failure and acute HF (EF 25-30% w/ severely decreased LV function).extubated yesterday.  Improving, currently on 3L [place] - d/c lasix today and reassess in AM  - goal SpO2 > 92% -pulm hygeine - stable for transfer to floor today.   Acute renal failure - nephrology following. No hx of kidney disease. Thought to be AKI from vanc nephrotoxicity.  Cr downtrending. Massive amount of UOP (8.2L) yesterday d/t diuretics and Post ATN diuresis.  - d/c lasix today. Reassess tomorrow.  -  strict I/O - daily BMP - avoid nephrotoxic agents  Acute CHF - no hx of CHF. Echo on 11/22 showed LVEF of 25-30% with diffuse hypokinesis. Cardiology consulted.  -  Continue BIDIL TID - cardiology following.   L wrist cellulitis with necrosis - s/p Abx and debridement w/ plastics on 11/20.  - acell per plastics  Schizophrenia - chronic. Not on  medication for the 3 years prior to hospitalization. Does not appear to be having active psychotic episode currently   - supportive care.  - consider d/c psych meds today: haldol, ativan, benztropine.   Hypokalemia -  3.3 today. 2/2 diuresis.   - replete 20mEq x 3 today.  - am BMP  Best practice:  Diet: heart healthy diet.  Pain/Anxiety/Delirium protocol (if indicated): n/a VAP protocol (if indicated): n/a DVT prophylaxis: lovenox GI prophylaxis: n/a Glucose control: SSI Mobility: BR Code Status: full Family Communication: pending Disposition: ICU. Patient stable for transfer to floor today.   Labs   CBC: Recent Labs  Lab 02/07/19 0415 02/11/19 0843 02/11/19 0929 02/12/19 0332 02/12/19 0718 02/13/19 0226  WBC 7.3  --  15.5*  --  9.4 7.9  NEUTROABS 4.5  --   --   --   --   --   HGB 10.8* 12.9* 10.9* 11.9* 9.9* 10.6*  HCT 31.6* 38.0* 32.7* 35.0* 30.0* 31.7*  MCV 85.2  --  88.4  --  88.5 89.0  PLT 301  --  353  --  300 320    Basic Metabolic Panel: Recent Labs  Lab 02/08/19 0502 02/09/19 0556 02/10/19 0458 02/11/19 0513 02/11/19 0843 02/12/19 0332 02/12/19 0718 02/13/19 0226  NA 142 141 143 141 141 141 143 142  K 3.3* 3.5 3.7 3.6 3.4* 3.4* 3.7 3.3*  CL 105 104 105 105  --   --  106 97*  CO2 24 23 24 25   --   --  27 32  GLUCOSE 99 93 106* 109*  --   --  93 101*  BUN 28* 27* 25* 24*  --   --  29* 28*  CREATININE 8.05* 7.56* 6.56* 5.27*  --   --  5.28* 4.70*  CALCIUM 8.1* 7.8* 8.0* 7.7*  --   --  8.2* 8.3*  MG 1.8  --   --   --   --   --  1.7  --   PHOS  --   --   --   --   --   --  3.1  --    GFR: Estimated Creatinine Clearance: 25.6 mL/min (A) (by C-G formula based on SCr of 4.7 mg/dL (H)). Recent Labs  Lab 02/07/19 0415 02/11/19 0929 02/12/19 0718 02/13/19 0226  WBC 7.3 15.5* 9.4 7.9    Liver Function Tests: No results for input(s): AST, ALT, ALKPHOS, BILITOT, PROT, ALBUMIN in the last 168 hours. No results for input(s): LIPASE, AMYLASE in the  last 168 hours. Recent Labs  Lab 02/11/19 0929  AMMONIA 44*    ABG    Component Value Date/Time   PHART 7.563 (H) 02/12/2019 0332   PCO2ART 30.4 (L) 02/12/2019 0332   PO2ART 86.0 02/12/2019 0332   HCO3 27.4 02/12/2019 0332   TCO2 28 02/12/2019 0332   ACIDBASEDEF 3.0 (H) 02/11/2019 0843   O2SAT 98.0 02/12/2019 0332     Coagulation Profile: No results for input(s): INR, PROTIME in the last 168 hours.  Cardiac Enzymes: No results for input(s): CKTOTAL, CKMB, CKMBINDEX, TROPONINI in the last 168 hours.  HbA1C: Hgb A1c MFr Bld  Date/Time Value Ref Range Status  02/11/2019 07:59 AM 5.3 4.8 - 5.6 % Final    Comment:    (NOTE) Pre diabetes:          5.7%-6.4% Diabetes:              >6.4% Glycemic control for   <7.0% adults with diabetes     CBG: Recent Labs  Lab 02/12/19 1513 02/12/19 1911 02/12/19 2318 02/13/19 0305 02/13/19 0721  GLUCAP 87 89 103* 118* 95    Review of Systems:   Unable to attain d/t pt condition.   Past Medical History  He,  has a past medical history of Schizophrenia ([facility]).   Surgical History    Past Surgical History:  Procedure Laterality Date  . INCISION AND DRAINAGE OF WOUND Left 02/09/2019   Procedure: debridement of left wrist wound with Acell placement;  Surgeon: Allena Napoleon, MD;  Location: [facility] OR;  Service: Plastics;  Laterality: Left;  1 hour     Social History      Family History   His family history is not on file.   Allergies No Known Allergies   Home Medications  Prior to Admission medications   Not on File     Critical care time: 20

## 2019-02-14 ENCOUNTER — Encounter (HOSPITAL_COMMUNITY): Payer: Self-pay | Admitting: General Practice

## 2019-02-14 ENCOUNTER — Telehealth: Payer: Self-pay

## 2019-02-14 LAB — CBC
HCT: 30.4 % — ABNORMAL LOW (ref 39.0–52.0)
Hemoglobin: 10 g/dL — ABNORMAL LOW (ref 13.0–17.0)
MCH: 29 pg (ref 26.0–34.0)
MCHC: 32.9 g/dL (ref 30.0–36.0)
MCV: 88.1 fL (ref 80.0–100.0)
Platelets: 344 10*3/uL (ref 150–400)
RBC: 3.45 MIL/uL — ABNORMAL LOW (ref 4.22–5.81)
RDW: 13.1 % (ref 11.5–15.5)
WBC: 7.7 10*3/uL (ref 4.0–10.5)
nRBC: 0 % (ref 0.0–0.2)

## 2019-02-14 LAB — BASIC METABOLIC PANEL
Anion gap: 13 (ref 5–15)
BUN: 26 mg/dL — ABNORMAL HIGH (ref 6–20)
CO2: 31 mmol/L (ref 22–32)
Calcium: 7.9 mg/dL — ABNORMAL LOW (ref 8.9–10.3)
Chloride: 97 mmol/L — ABNORMAL LOW (ref 98–111)
Creatinine, Ser: 3.81 mg/dL — ABNORMAL HIGH (ref 0.61–1.24)
GFR calc Af Amer: 22 mL/min — ABNORMAL LOW (ref >60)
GFR calc non Af Amer: 19 mL/min — ABNORMAL LOW (ref >60)
Glucose, Bld: 102 mg/dL — ABNORMAL HIGH (ref 70–99)
Potassium: 3.2 mmol/L — ABNORMAL LOW (ref 3.5–5.1)
Sodium: 141 mmol/L (ref 135–145)

## 2019-02-14 LAB — GLUCOSE, CAPILLARY
Glucose-Capillary: 95 mg/dL (ref 70–99)
Glucose-Capillary: 110 mg/dL — ABNORMAL HIGH (ref 70–99)
Glucose-Capillary: 98 mg/dL (ref 70–99)

## 2019-02-14 MED ORDER — POTASSIUM CHLORIDE CRYS ER 20 MEQ PO TBCR
40.0000 meq | EXTENDED_RELEASE_TABLET | Freq: Once | ORAL | Status: AC
  Administered 2019-02-14: 40 meq via ORAL
  Filled 2019-02-14: qty 2

## 2019-02-14 MED ORDER — ISOSORB DINITRATE-HYDRALAZINE 20-37.5 MG PO TABS: 0.5000 | ORAL_TABLET | Freq: Two times a day (BID) | ORAL | 0 refills | Status: DC

## 2019-02-14 MED ORDER — ACETAMINOPHEN 325 MG PO TABS: 650.0000 mg | ORAL_TABLET | Freq: Four times a day (QID) | ORAL | Status: AC | PRN

## 2019-02-14 NOTE — Plan of Care (Signed)
  Problem: Education: Goal: Knowledge of General Education information will improve Description Including pain rating scale, medication(s)/side effects and non-pharmacologic comfort measures Outcome: Progressing   

## 2019-02-14 NOTE — Progress Notes (Signed)
Pt discharged home with fiance.

## 2019-02-14 NOTE — Discharge Summary (Signed)
Physician Discharge Summary  Tyrone Jenkins KDT:[id number] DOB: January 29, 1981 DOA: 01/27/2019  PCP: Patient, No Pcp Per  Admit date: 01/27/2019 Discharge date: 02/14/2019  Time spent: 35 minutes  Recommendations for Outpatient Follow-up:  PCP in 1 week with labs, bmet Plastic surgery Dr. Merry Proud in 1 week Cardiology Dr. Rosemary Holms in 1 month    Discharge Diagnoses:  Principal Problem:   Cellulitis of arm Active Problems:   Schizophrenia, unspecified (HCC)   Necrosis (HCC)   Elevated serum creatinine   Acute respiratory failure with hypoxemia (HCC)   Acute systolic heart failure [facility])   Discharge Condition: Stable Diet recommendation: Low-sodium, heart healthy  Filed Weights   02/11/19 0356 02/12/19 0200 02/13/19 0500  Weight: 109.9 kg 108.1 kg 98.1 kg    History of present illness:  38 yo M PMH schizophrenia who was found wandering 11/7 and noted to have bilateral wrist wounds. Initially IVC'd, development of concern for cellulitis of wrists with necrosis of L wrist tissue. Started on broad spectrum abx (vanc, clinda, zosyn). Developed acute renal failure-- concerning for vanc toxicity. Renal indices continue to gradually improve and patient has not required RRT. 11/20 patient underwent debridement and acell placement of L wrist with plastics. 11/22 overnight patient with rapidly decompensating respiratory status and overt pulmonary edema. ABG 7.456/ 36.4/ 53.5// 1.7/ 25.3/ 87.8 Transferred to ICU and intubated emergently. -Improved stabilized and transferred to [facility] service today on the day of discharge 11/25  Hospital Course:   Acute hypoxic respiratory failure - secondary to pulmonary edema from acute kidney failure and acute HF (EF 25-30% w/ severely decreased LV function).extubated 11/23 -Clinically improved with improvement in renal failure and post ATN diuresis, diuretics discontinued -Weaned off oxygen -He was transferred from [facility] to [facility] service today and is  ambulating in the halls without symptoms and anxious to go home -Advised importance of follow-up with PCP in 1 week with repeat labs to reassess kidney function and follow-up with cardiology in 1 to 2 months for repeat echo anticipate improvement in EF  Acute renal failure - -developed AKI from vanc nephrotoxicity.  ,  Creatinine peaked to 9.2 now trending down nicely with massive post ATN diuresis, creatinine down to 3.8 at discharge -Lasix discontinued -Followed by nephrology, at this time okay to discharge home from renal standpoint -Advised to have PCP check labs in 1 week  Acute CHF - no hx of CHF. Echo on 11/22 showed LVEF of 25-30% with diffuse hypokinesis. Cardiology consulted, this is felt to be acute in the setting of renal failure, fluid overload etc. -  Continue BIDIL TID -   Did not require diuretics at discharge, and post ATN diuresis, follow-up with Dr. Rosemary Holms in few months for repeat echo  L wrist cellulitis with necrosis - s/p Abx and debridement w/ plastics on 11/20.  - acell per plastics -Follow-up with plastic surgeon in 1 week  Schizophrenia - chronic. Not on medication for the 3 years prior to hospitalization.   -Not taking any medications for this currently - Follow-up with psychiatry  Hypokalemia  -Repleted  Procedures:  Mechanical intubation  Left wrist debridement with ACell placement by plastic surgery, 11/20  Consultations: Plastic surgery, psychiatry, nephrology, PCCM Discharge Exam: Vitals:   02/14/19 0357 02/14/19 0358  BP: (!) 132/105 (!) 131/92  Pulse: 82 75  Resp: 20   Temp: 98.2 F (36.8 C)   SpO2: 94% 92%    General: AAOx3 Cardiovascular: S1-S2, regular rate rhythm Respiratory: Clear  Discharge Instructions  Discharge Instructions    Diet - low sodium heart healthy   Complete by: As directed    Increase activity slowly   Complete by: As directed      Allergies as of 02/14/2019   No Known Allergies      Medication List    TAKE these medications   acetaminophen 325 MG tablet Commonly known as: TYLENOL Take 2 tablets (650 mg total) by mouth every 6 (six) hours as needed for mild pain or headache (or Fever >/= 101).   isosorbide-hydrALAZINE 20-37.5 MG tablet Commonly known as: BIDIL Take 0.5 tablets by mouth 2 (two) times daily.      No Known Allergies Follow-up Information    Allena NapoleonPace, Collier S, MD. Schedule an appointment as soon as possible for a visit in 5 day(s).   Specialty: Plastic Surgery Contact information: [address] [place] [address] [phone]        PCP. Schedule an appointment as soon as possible for a visit.   Why: for labs to check Bmet/kidney function       Patwardhan, Manish J, MD Follow up in 1 month(s).   Specialties: Cardiology, Radiology Why: for repeat ECHO Contact information: [address] [place] [address] [phone]            The results of significant diagnostics from this hospitalization (including imaging, microbiology, ancillary and laboratory) are listed below for reference.    Significant Diagnostic Studies: Dg Wrist Complete Left  Result Date: 01/27/2019 CLINICAL DATA:  Open wound lateral left distal forearm. EXAM: LEFT WRIST - COMPLETE 3+ VIEW COMPARISON:  None. FINDINGS: Soft tissue defect over the radial aspect of the wrist. No significant soft tissue air. Underlying bony structures are normal. IMPRESSION: No acute bony abnormality. Focal soft tissue defect over the lateral aspect of the wrist. No air in the soft tissues. Electronically Signed   By: Elberta Fortisaniel  Boyle M.D.   On: 01/27/2019 10:10   Ct Head Wo Contrast  Result Date: 01/27/2019 CLINICAL DATA:  Headache, posttraumatic.  Blunt trauma. EXAM: CT HEAD WITHOUT CONTRAST CT MAXILLOFACIAL WITHOUT CONTRAST TECHNIQUE: Multidetector CT imaging of the head and maxillofacial structures were performed using the standard protocol without  intravenous contrast. Multiplanar CT image reconstructions of the maxillofacial structures were also generated. COMPARISON:  Head CT 03/10/2018 FINDINGS: CT HEAD FINDINGS Brain: No evidence of acute intracranial hemorrhage. No demarcated cortical infarction. No evidence of intracranial mass. No midline shift or extra-axial fluid collection. Left choroid fissure cyst. Vascular: No hyperdense vessel Skull: No calvarial fracture. CT MAXILLOFACIAL FINDINGS Mild-to-moderate motion degradation. Osseous: Motion degradation somewhat limits evaluation for acute fracture. No acute fracture identified. Orbits: The globes and orbits are unremarkable. Sinuses: Right frontal sinus mucous retention cyst. Otherwise, no significant paranasal sinus disease. No mastoid effusion. Soft tissues: Unremarkable IMPRESSION: CT head: No evidence of acute intracranial abnormality. CT maxillofacial: 1. Mild-to-moderate motion degradation slightly limits evaluation for acute fracture. 2. No evidence of acute maxillofacial fracture. Electronically Signed   By: Jackey LogeKyle  Golden DO   On: 01/27/2019 09:59   Ct Forearm Left Wo Contrast  Result Date: 01/29/2019 CLINICAL DATA:  Gangrenous wound of the left forearm EXAM: CT OF THE LEFT FOREARM WITHOUT CONTRAST TECHNIQUE: Multidetector CT imaging was performed according to the standard protocol. Multiplanar CT image reconstructions were also generated. COMPARISON:  X-ray 01/27/2019 FINDINGS: Bones/Joint/Cartilage No acute fracture or dislocation. No cortical erosion or periostitis. No focal bone lesion. Joint spaces of the elbow and wrist  are maintained. No elbow joint effusion. Ligaments Suboptimally assessed by CT. Muscles and Tendons Muscular bulk is maintained without atrophy or fatty infiltration. Tendinous structures intact within the limitations of this exam. No large tenosynovial fluid collection is evident. Soft tissues There is skin defect overlying the radial aspect of the distal forearm at  the level of the distal radial metaphysis with underlying soft tissue induration (series 3, image 47). No underlying fluid collection or soft tissue gas. There is mild subcutaneous edema within the surrounding tissues. No deep fascial fluid is evident by CT. No radiopaque soft tissue foreign bodies. IMPRESSION: 1. Skin defect overlying the radial aspect of the distal forearm at the level of the distal radial metaphysis with underlying soft tissue induration compatible with known ulceration and associated cellulitis. No underlying fluid collection/abscess or soft tissue gas. 2. No acute osseous abnormality or CT evidence of osteomyelitis. Electronically Signed   By: Davina Poke M.D.   On: 01/29/2019 21:44   US Renal  Result Date: 01/31/2019 CLINICAL DATA:  Elevated creatinine. EXAM: RENAL / URINARY TRACT ULTRASOUND COMPLETE COMPARISON:  None. FINDINGS: Right Kidney: Renal measurements: 10.6 x 7.0 x 6.9 cm = volume: 264 mL . Echogenicity within normal limits. No mass or hydronephrosis visualized. Left Kidney: Renal measurements: 12.0 x 6.95.8 cm = volume: 251 mL. Echogenicity within normal limits. No mass or hydronephrosis visualized. Bladder: Appears normal for degree of bladder distention. Other: None. IMPRESSION: No acute process or explanation for elevated creatinine. Electronically Signed   By: Abigail Miyamoto M.D.   On: 01/31/2019 19:28   Dg Chest Port 1 View  Result Date: 02/12/2019 CLINICAL DATA:  Endotracheal tube. EXAM: PORTABLE CHEST 1 VIEW COMPARISON:  February 11, 2019. FINDINGS: Stable cardiomediastinal silhouette. Endotracheal and nasogastric tubes are unchanged in position. No pneumothorax is noted. Improved bilateral lung opacities are noted. Small left pleural effusion is noted. Bony thorax is unremarkable. IMPRESSION: Stable support apparatus. Improved bilateral lung opacities are noted. Small left pleural effusion is noted. No pneumothorax is noted. Electronically Signed   By: Marijo Conception M.D.   On: 02/12/2019 07:43   Dg Chest Port 1 View  Result Date: 02/11/2019 CLINICAL DATA:  Intubation. New onset renal failure with acute decompensation. EXAM: PORTABLE CHEST 1 VIEW COMPARISON:  Earlier today FINDINGS: New endotracheal tube with tip just below the clavicular heads. The orogastric tube at least reaches the stomach. Bilateral airspace disease with interval worsening. There is likely pleural fluid, especially on the left. Prominent cardiac size accentuated by technique. No visible pneumothorax. IMPRESSION: 1. Unremarkable hardware positioning. 2. Worsening pulmonary edema. Electronically Signed   By: Monte Fantasia M.D.   On: 02/11/2019 08:33   Dg Chest Port 1 View  Result Date: 02/11/2019 CLINICAL DATA:  Initial evaluation for worsening shortness of breath. EXAM: PORTABLE CHEST 1 VIEW COMPARISON:  None available. FINDINGS: Cardiomegaly.  Mediastinal silhouette within normal limits. Lungs normally inflated. Diffuse vascular congestion with interstitial prominence, compatible with moderate diffuse pulmonary interstitial edema. No definite pleural effusion. No consolidative airspace disease. No pneumothorax. No acute osseous finding. IMPRESSION: Cardiomegaly with moderate diffuse pulmonary interstitial edema. Electronically Signed   By: Jeannine Boga M.D.   On: 02/11/2019 04:47   Dg Abd Portable 1v  Result Date: 02/11/2019 CLINICAL DATA:  Orogastric tube placement. EXAM: PORTABLE ABDOMEN - 1 VIEW COMPARISON:  None. FINDINGS: Nasogastric terminates at the body of the stomach. Left lower lobe airspace disease. No gross free intraperitoneal air or bowel obstruction. Only the upper abdomen  is imaged. IMPRESSION: Nasogastric tube terminating at the body of the stomach. Electronically Signed   By: Jeronimo Greaves M.D.   On: 02/11/2019 10:44   Ct Maxillofacial Wo Contrast  Result Date: 01/27/2019 CLINICAL DATA:  Headache, posttraumatic.  Blunt trauma. EXAM: CT HEAD WITHOUT  CONTRAST CT MAXILLOFACIAL WITHOUT CONTRAST TECHNIQUE: Multidetector CT imaging of the head and maxillofacial structures were performed using the standard protocol without intravenous contrast. Multiplanar CT image reconstructions of the maxillofacial structures were also generated. COMPARISON:  Head CT 03/10/2018 FINDINGS: CT HEAD FINDINGS Brain: No evidence of acute intracranial hemorrhage. No demarcated cortical infarction. No evidence of intracranial mass. No midline shift or extra-axial fluid collection. Left choroid fissure cyst. Vascular: No hyperdense vessel Skull: No calvarial fracture. CT MAXILLOFACIAL FINDINGS Mild-to-moderate motion degradation. Osseous: Motion degradation somewhat limits evaluation for acute fracture. No acute fracture identified. Orbits: The globes and orbits are unremarkable. Sinuses: Right frontal sinus mucous retention cyst. Otherwise, no significant paranasal sinus disease. No mastoid effusion. Soft tissues: Unremarkable IMPRESSION: CT head: No evidence of acute intracranial abnormality. CT maxillofacial: 1. Mild-to-moderate motion degradation slightly limits evaluation for acute fracture. 2. No evidence of acute maxillofacial fracture. Electronically Signed   By: Jackey Loge DO   On: 01/27/2019 09:59    Microbiology: Recent Results (from the past 240 hour(s))  Surgical PCR screen     Status: None   Collection Time: 02/07/19  8:25 PM   Specimen: Nasal Mucosa; Nasal Swab  Result Value Ref Range Status   MRSA, PCR NEGATIVE NEGATIVE Final   Staphylococcus aureus NEGATIVE NEGATIVE Final    Comment: (NOTE) The Xpert SA Assay (FDA approved for NASAL specimens in patients 58 years of age and older), is one component of a comprehensive surveillance program. It is not intended to diagnose infection nor to guide or monitor treatment. Performed at [facility], [address] [place], [place] [address]      Labs: Basic Metabolic Panel: Recent Labs  Lab  02/08/19 0502  02/10/19 0458 02/11/19 6213 02/11/19 0843 02/12/19 0332 02/12/19 0718 02/13/19 0226 02/13/19 1202 02/14/19 0141  NA 142   < > 143 141 141 141 143 142  --  141  K 3.3*   < > 3.7 3.6 3.4* 3.4* 3.7 3.3* 3.5 3.2*  CL 105   < > 105 105  --   --  106 97*  --  97*  CO2 24   < > 24 25  --   --  27 32  --  31  GLUCOSE 99   < > 106* 109*  --   --  93 101*  --  102*  BUN 28*   < > 25* 24*  --   --  29* 28*  --  26*  CREATININE 8.05*   < > 6.56* 5.27*  --   --  5.28* 4.70*  --  3.81*  CALCIUM 8.1*   < > 8.0* 7.7*  --   --  8.2* 8.3*  --  7.9*  MG 1.8  --   --   --   --   --  1.7  --   --   --   PHOS  --   --   --   --   --   --  3.1  --   --   --    < > = values in this interval not displayed.   Liver Function Tests: No results for input(s): AST, ALT, ALKPHOS, BILITOT,  PROT, ALBUMIN in the last 168 hours. No results for input(s): LIPASE, AMYLASE in the last 168 hours. Recent Labs  Lab 02/11/19 0929  AMMONIA 44*   CBC: Recent Labs  Lab 02/11/19 0929 02/12/19 0332 02/12/19 0718 02/13/19 0226 02/14/19 0141  WBC 15.5*  --  9.4 7.9 7.7  HGB 10.9* 11.9* 9.9* 10.6* 10.0*  HCT 32.7* 35.0* 30.0* 31.7* 30.4*  MCV 88.4  --  88.5 89.0 88.1  PLT 353  --  300 320 344   Cardiac Enzymes: No results for input(s): CKTOTAL, CKMB, CKMBINDEX, TROPONINI in the last 168 hours. BNP: BNP (last 3 results) Recent Labs    02/11/19 0929  BNP >4,500.0*    ProBNP (last 3 results) No results for input(s): PROBNP in the last 8760 hours.  CBG: Recent Labs  Lab 02/13/19 1942 02/13/19 2335 02/14/19 0400 02/14/19 0745 02/14/19 1142  GLUCAP 124* 119* 95 110* 98       Signed:  Zannie Cove MD.  [facility] 02/14/2019, 1:21 PM

## 2019-02-14 NOTE — Progress Notes (Signed)
Patient transferred to 6N06 from medical ICU. Alert and oriented x4. Vital signs within normal limit. Dressing to left arm clean,dry and intact. Denies pain. Oriented to room and remote.

## 2019-02-14 NOTE — TOC Transition Note (Signed)
Transition of Care [facility]) - CM/SW Discharge Note   Patient Details  Name: Tyrone Jenkins MRN: [id number] Date of Birth: 1980/09/06  Transition of Care [facility]) CM/SW Contact:  Marilu Favre, RN Phone Number: 02/14/2019, 1:17 PM   Clinical Narrative:     Consult for PCP. Spoke to patient at bedside. Confirmed patient does have insurance. Explained he can call number on insurance card to be provided a full list of PCP's in network and pick from list. Also if patient has a friend or family member who recommends a PCP he can call the office directly to see if they accept insurance and are taking new patients. Patient voices understanding and states he will arrange PCP   Final next level of care: Home/Self Care Barriers to Discharge: No Barriers Identified   Patient Goals and CMS Choice Patient states their goals for this hospitalization and ongoing recovery are:: to return to home CMS Medicare.gov Compare Post Acute Care list provided to:: Patient Choice offered to / list presented to : NA  Discharge Placement                       Discharge Plan and Services In-house Referral: PCP / [facility] Discharge Planning Services: CM Consult Post Acute Care Choice: NA          DME Arranged: N/A         HH Arranged: NA          Social Determinants of Health (SDOH) Interventions     Readmission Risk Interventions No flowsheet data found.

## 2019-02-14 NOTE — Plan of Care (Signed)
Pt for discharge going home,alert and oriented, ambulatory, with  left arm dressing changed dressing, given tylenol for pain, given health teachings, next appointment, due med explained and understood, discontinued peripheral IV line, family at the bedside, given all his personal belongings, wound site dry and intact.

## 2019-02-19 ENCOUNTER — Encounter (HOSPITAL_COMMUNITY): Payer: Self-pay | Admitting: Emergency Medicine

## 2019-02-21 ENCOUNTER — Other Ambulatory Visit: Payer: Self-pay

## 2019-02-21 ENCOUNTER — Encounter: Payer: Self-pay | Admitting: Plastic Surgery

## 2019-02-21 ENCOUNTER — Ambulatory Visit: Payer: BC Managed Care – PPO | Admitting: Plastic Surgery

## 2019-02-21 VITALS — BP 153/98 | HR 80 | Temp 97.7°F | Ht 72.0 in | Wt 205.0 lb

## 2019-02-21 DIAGNOSIS — L03114 Cellulitis of left upper limb: Secondary | ICD-10-CM

## 2019-02-21 NOTE — Progress Notes (Signed)
Patient is here postop after debridement and ACell placement of the left wrist wound.  The overall thinks is doing fine has been doing well out of the hospital.  The sore back outer dressing was removed revealing healthy granulating wound underneath with the majority of the ACell incorporated.  He has normal hand function on that side.  We will plan to redress this and do wound care with K-Y jelly, Adaptic, and 4 x 4's with a soft wrap.  I will plan to see him again in 2 weeks.  All of his questions were answered.  He can shower now.  He is overall very happy with how things have gone.

## 2019-02-23 ENCOUNTER — Telehealth: Payer: Self-pay | Admitting: Plastic Surgery

## 2019-02-23 NOTE — Telephone Encounter (Signed)
Patient will need 4x4, adaptic, kerlix. He will need to purchase KY jelly from pharmacy. Daily changes, apply ky daily to adaptic (change adaptic based on soil level, typically q2-3 days - cut to size of wound), 4x4, wrap with kerlix.

## 2019-02-23 NOTE — Telephone Encounter (Signed)
Called and spoke with Tabitah with [employer] regarding the message below from [place].  She stated that the order has been taking care of. They have all the information they need.//AB/CMA

## 2019-02-23 NOTE — Telephone Encounter (Signed)
Zach from [facility] medical calling to get orders for Tyrone Jenkins's wound care supplies so he can get order sent to him. He can be reached at [phone]

## 2019-03-07 ENCOUNTER — Telehealth: Payer: Self-pay

## 2019-03-07 NOTE — Telephone Encounter (Signed)

## 2019-03-08 ENCOUNTER — Other Ambulatory Visit: Payer: Self-pay

## 2019-03-08 ENCOUNTER — Ambulatory Visit: Payer: BC Managed Care – PPO | Admitting: Plastic Surgery

## 2019-03-08 ENCOUNTER — Encounter: Payer: Self-pay | Admitting: Plastic Surgery

## 2019-03-08 VITALS — BP 152/78 | HR 82 | Temp 98.2°F | Ht 72.0 in | Wt 202.0 lb

## 2019-03-08 DIAGNOSIS — L03114 Cellulitis of left upper limb: Secondary | ICD-10-CM

## 2019-03-08 NOTE — Progress Notes (Signed)
   Referring Provider No referring provider defined for this encounter.   CC: No chief complaint on file. Left wrist wound  Tyrone Jenkins is an 38 y.o. male.  HPI: Patient presents for follow-up after debridement and ACell placement for left wrist wound.  Overall he is doing great with no complaints and feels like things are progressing nicely.  He has been changing the wound daily with K-Y jelly and a nonstick dressing and a soft wrap.  Review of Systems General: Denies fevers, chills, shortness of breath  Physical Exam Vitals with BMI 02/21/2019 01/19/2014 01/19/2014  Height 6\' 0"  - -  Weight 205 lbs - -  BMI 27.7 - -  Systolic 824 235 361  Diastolic 98 67 77  Pulse 80 78 114  Some encounter information is confidential and restricted. Go to Review Flowsheets activity to see all data.    General:  No acute distress,  Alert and oriented, Non-Toxic, Normal speech and affect The wrist wound is granulating nicely.  The overall size is about 2 to 3 cm in diameter at this point.  There is no surrounding erythema fluctuance or signs of any complication.  His hand has normal function.  Assessment/Plan His wrist wound is doing nicely.  We will continue the wound care as is being done.  I will plan to see him in another 3 weeks which I expect will be his last visit with [place].  He knows if he has any trouble in the meantime he can call us and come in sooner.  Cindra Presume 03/08/2019, 8:33 AM

## 2019-03-20 ENCOUNTER — Ambulatory Visit (INDEPENDENT_AMBULATORY_CARE_PROVIDER_SITE_OTHER): Payer: BC Managed Care – PPO

## 2019-03-20 ENCOUNTER — Other Ambulatory Visit: Payer: Self-pay

## 2019-03-20 DIAGNOSIS — I5021 Acute systolic (congestive) heart failure: Secondary | ICD-10-CM | POA: Diagnosis not present

## 2019-03-26 ENCOUNTER — Other Ambulatory Visit: Payer: Self-pay

## 2019-03-26 ENCOUNTER — Ambulatory Visit (INDEPENDENT_AMBULATORY_CARE_PROVIDER_SITE_OTHER): Payer: BC Managed Care – PPO | Admitting: Cardiology

## 2019-03-26 ENCOUNTER — Encounter: Payer: Self-pay | Admitting: Cardiology

## 2019-03-26 VITALS — BP 128/97 | HR 79 | Temp 96.5°F | Resp 16 | Ht 72.0 in | Wt 211.9 lb

## 2019-03-26 DIAGNOSIS — I5020 Unspecified systolic (congestive) heart failure: Secondary | ICD-10-CM

## 2019-03-26 MED ORDER — METOPROLOL SUCCINATE ER 25 MG PO TB24: 50.0000 mg | ORAL_TABLET | Freq: Every day | ORAL | 3 refills | Status: AC

## 2019-03-26 NOTE — Progress Notes (Signed)
Follow up visit  Subjective:   Tyrone Jenkins, male    DOB: [birth year], 39 y.o.   MRN: [id number]   HPI  39 y.o.caucasianmalewith schizophrenia, prolonged hospitalization with cellulitis (b/l wrists), HFrEF, acute kidney injury.  Patient was admitted to the hospital on 01/29/2019 after he was found wandering on the road with open wounds on his wrists. He was subsequently treated with appropriate antibiotics, including vancomycin. During hospitalization, he developed acute kidney injury, with Cr reaching up to 9.2, trended down to 3.8 on discharge with excellent urine output. On 02/11/2019, he had acute respiratory distress, necessitating intubation. Echocardiogram today showed EF 25-30%. Etiology unclear at that time. Cellulitis, subsequent renal failure may be partly responsible. No other obvious causes evid the upper back bicarb ent from history, EKG, physical exam. Workup with cardiac MRI/coronary angiography not possible at that time due to renal dysfunction. Patient diuresed over 13 L in 2 days.  He was euvolemic at the time of discharge. Improved opacities on c morning hest Xray.  Troponin patient was thought to be supply demand mismatch in the setting of acute heart failure.  Patient underwent follow-up echocardiogram on 03/20/2019. LVEF remained low at 30-35% with severe diffuse hypokinesis. Compared to previous study on 93/73/4287, RV systolic function is improved. No other significant change noted.   Since his discharge, he has had no chest pain. He has shortness of breath with more than usual physical activity,m such as walking at [employer]. He denies orthopnea, PND, leg edema, presyncope or syncope. He has not had any labs sine his last visit. He is scheduled to establish care with [facility] tomorrow.   He denies any family h/o heart failure. He denies any heavy alcohol use. He has a drink once a month or so. He denies any recreational drug use.   His hand injuries are  healing well. He is following up with plastic surgery.   He is not on any treatment for his schizophrenia at baseline.    Current Outpatient Medications on File Prior to Visit  Medication Sig Dispense Refill  . acetaminophen (TYLENOL) 325 MG tablet Take 2 tablets (650 mg total) by mouth every 6 (six) hours as needed for mild pain or headache (or Fever >/= 101).    . isosorbide-hydrALAZINE (BIDIL) 20-37.5 MG tablet Take 0.5 tablets by mouth 2 (two) times daily. 60 tablet 0  . Multiple Vitamin (MULTIVITAMIN WITH MINERALS) TABS tablet Take 1 tablet by mouth daily.     No current facility-administered medications on file prior to visit.    Cardiovascular & other pertient studies:  Echocardiogram 03/20/2019: Left ventricle cavity is normal in size. Mild concentric hypertrophy of the left ventricle. severe global hypokinesis. LVEF 30-35%. Doppler evidence of grade I (impaired) diastolic dysfunction, normal LAP.  No significant valvular abnormality.  Normal right atrial pressure.  Compared to previous study on 68/01/5725, RV systolic function is improved. No other significant change noted.   EKG 02/12/2019: Sinus tachycardia 107 bpm. Nonspecific ST-T changes  Recent labs: 02/14/2019: Glucose 102, BUN/Cr 26/3.81. EGFR 19. Na/K 141/3.2. Rest of the CMP normal H/H 10/30. MCV 88. Platelets 344 HbA1C 5.3% TG 136  Labs 2012: Chol 168, TG 225, HDL 32, LDL 91 TSH 1.2 normal  Results for Tyrone Jenkins, Tyrone Jenkins (MRN [id number]) as of 03/26/2019 08:08  Ref. Range 02/12/2019 12:28 02/12/2019 14:13  Troponin I (High Sensitivity) Latest Ref Range: <18 ng/L 289 (HH) 256 (HH)    Review of Systems  Constitution: Negative for decreased appetite, malaise/fatigue,  weight gain and weight loss.  HENT: Negative for congestion.   Eyes: Negative for visual disturbance.  Cardiovascular: Positive for dyspnea on exertion. Negative for chest pain, leg swelling, palpitations and syncope.  Respiratory: Positive  for shortness of breath. Negative for cough.   Endocrine: Negative for cold intolerance.  Hematologic/Lymphatic: Does not bruise/bleed easily.  Skin: Negative for itching and rash.  Musculoskeletal: Negative for myalgias.  Gastrointestinal: Negative for abdominal pain, nausea and vomiting.  Genitourinary: Negative for dysuria.  Neurological: Negative for dizziness and weakness.  Psychiatric/Behavioral: The patient is not nervous/anxious.   All other systems reviewed and are negative.        Vitals:   03/26/19 0907 03/26/19 0913  BP: (!) 142/101 (!) 128/97  Pulse: 66 79  Resp: 16   Temp: (!) 96.5 F (35.8 C)   SpO2: 96% 97%     Body mass index is 28.74 kg/m. Filed Weights   03/26/19 0907  Weight: 211 lb 14.4 oz (96.1 kg)     Objective:   Physical Exam  Constitutional: He is oriented to person, place, and time. He appears well-developed and well-nourished. No distress.  HENT:  Head: Normocephalic and atraumatic.  Eyes: Pupils are equal, round, and reactive to light. Conjunctivae are normal.  Neck: No JVD present.  Cardiovascular: Normal rate, regular rhythm and intact distal pulses.  No murmur heard. Pulmonary/Chest: Effort normal and breath sounds normal. He has no wheezes. He has no rales.  Abdominal: Soft. Bowel sounds are normal. There is no rebound.  Musculoskeletal:        General: No edema.     Comments: Healing injuries on bilateral wrists  Lymphadenopathy:    He has no cervical adenopathy.  Neurological: He is alert and oriented to person, place, and time. No cranial nerve deficit.  Skin: Skin is warm and dry.  Psychiatric:  Flat affect  Nursing note and vitals reviewed.         Assessment & Recommendations:   39 y.o.caucasianmalewith schizophrenia, prolonged hospitalization with cellulitis (b/l wrists), HFrEF, acute kidney injury.  HFrEF: Symptoms include exertional dyspnea NYHA class III.  Etiology remains unclear.  Will obtain echo to  rule out any reversible causes.  Recommend stress test.  Will check TSH, BNP, CBC, CMP. Continue BiDil,.  Start metoprolol succinate 25 mg daily.  Subject to resolution of acute kidney injury, will start ARNI  Defer management of other medical problems to PCP.    Nigel Mormon, MD [facility] [place] Pager: [phone] Office: [phone]

## 2019-03-28 ENCOUNTER — Other Ambulatory Visit: Payer: Self-pay

## 2019-03-28 DIAGNOSIS — I5021 Acute systolic (congestive) heart failure: Secondary | ICD-10-CM

## 2019-03-28 MED ORDER — ISOSORB DINITRATE-HYDRALAZINE 20-37.5 MG PO TABS: 0.5000 | ORAL_TABLET | Freq: Two times a day (BID) | ORAL | 1 refills | Status: DC

## 2019-03-29 ENCOUNTER — Other Ambulatory Visit: Payer: Self-pay

## 2019-03-29 ENCOUNTER — Encounter: Payer: Self-pay | Admitting: Surgical

## 2019-03-29 ENCOUNTER — Ambulatory Visit: Payer: BC Managed Care – PPO | Admitting: Surgical

## 2019-03-29 VITALS — BP 126/85 | HR 67 | Temp 97.1°F | Ht 72.0 in | Wt 213.4 lb

## 2019-03-29 DIAGNOSIS — L03114 Cellulitis of left upper limb: Secondary | ICD-10-CM | POA: Diagnosis not present

## 2019-03-29 NOTE — Progress Notes (Signed)
   Subjective:     Patient ID: Tyrone Jenkins, male    DOB: 04-25-1980, 39 y.o.   MRN: [id number]  Chief Complaint  Patient presents with  . Follow-up    3 weeks for (L) wrist wound    HPI: The patient is a 39 y.o. male here for follow-up after debridement and placement of ACell of his left wrist wound.  He is doing really well.  He reports that over the last week or so the middle part of the wound finally closed up.  The epithelial tissue at this area is still thin, but there is no open wound at this time.  He has been applying K-Y jelly daily with nonstick dressing and Coban wrap.  He has some Vicryl sutures at the edge of the wound.  He has good range of motion, does not feel as if he has a deficit with hand movement.  Review of Systems  Constitutional: Negative for chills, diaphoresis, fatigue and fever.  Respiratory: Negative.   Cardiovascular: Negative.   Skin: Positive for color change. Negative for rash and wound.   Objective:   Vital Signs BP 126/85 (BP Location: Left Arm, Patient Position: Sitting, Cuff Size: Large)   Pulse 67   Temp (!) 97.1 F (36.2 C) (Temporal)   Ht 6' (1.829 m)   Wt 213 lb 6.4 oz (96.8 kg)   SpO2 97%   BMI 28.94 kg/m  Vital Signs and Nursing Note Reviewed Physical Exam  Constitutional: He is oriented to person, place, and time and well-developed, well-nourished, and in no distress.  HENT:  Head: Normocephalic and atraumatic.  Cardiovascular: Normal rate.  Pulses:      Radial pulses are 2+ on the left side.  Pulmonary/Chest: Effort normal.  Musculoskeletal:        General: No tenderness or edema.  Neurological: He is alert and oriented to person, place, and time. Gait normal.  Skin: Skin is warm and dry. He is not diaphoretic. No erythema.     Psychiatric: Mood and affect normal.    Assessment/Plan:     ICD-10-CM   1. Cellulitis of left upper extremity  L03.114     Continue to apply K-Y jelly for 1 more week with a bandage or  nonstick pad.  After 1 week he can begin not having any bandage in place.  The entire wound has epithelialized.  Vicryl sutures were removed today.  He reports he has good range of motion of his hand and is overall doing well.  Follow-up as needed.  Call with any question concerns.   Kermit Balo [place], PA-C 03/29/2019, 11:22 AM

## 2019-04-04 ENCOUNTER — Other Ambulatory Visit: Payer: Self-pay

## 2019-04-04 DIAGNOSIS — I5021 Acute systolic (congestive) heart failure: Secondary | ICD-10-CM

## 2019-04-04 MED ORDER — ISOSORB DINITRATE-HYDRALAZINE 20-37.5 MG PO TABS: 0.5000 | ORAL_TABLET | Freq: Two times a day (BID) | ORAL | 1 refills | Status: AC

## 2019-04-09 ENCOUNTER — Other Ambulatory Visit: Payer: BC Managed Care – PPO

## 2019-04-09 ENCOUNTER — Telehealth: Payer: Self-pay | Admitting: Cardiology

## 2019-04-26 ENCOUNTER — Ambulatory Visit: Payer: BC Managed Care – PPO | Admitting: Cardiology

## 2019-04-26 NOTE — Progress Notes (Deleted)
Follow up visit  Subjective:   Tyrone Jenkins, male    DOB: 1981-03-13, 39 y.o.   MRN: [id number]   HPI  39 y.o.caucasianmalewith schizophrenia, prolonged hospitalization with cellulitis (b/l wrists), HFrEF, acute kidney injury.  Patient was admitted to the hospital on 01/29/2019 after he was found wandering on the road with open wounds on his wrists. He was subsequently treated with appropriate antibiotics, including vancomycin. During hospitalization, he developed acute kidney injury, with Cr reaching up to 9.2, trended down to 3.8 on discharge with excellent urine output. On 02/11/2019, he had acute respiratory distress, necessitating intubation. Echocardiogram today showed EF 25-30%. Etiology unclear at that time. Cellulitis, subsequent renal failure may be partly responsible. No other obvious causes evid the upper back bicarb ent from history, EKG, physical exam. Workup with cardiac MRI/coronary angiography not possible at that time due to renal dysfunction. Patient diuresed over 13 L in 2 days.  He was euvolemic at the time of discharge. Improved opacities on c morning hest Xray.  Troponin patient was thought to be supply demand mismatch in the setting of acute heart failure.  Patient underwent follow-up echocardiogram on 03/20/2019. LVEF remained low at 30-35% with severe diffuse hypokinesis. Compared to previous study on 20/25/4270, RV systolic function is improved. No other significant change noted.   Since his discharge, he has had no chest pain. He has shortness of breath with more than usual physical activity,m such as walking at [employer]. He denies orthopnea, PND, leg edema, presyncope or syncope. He has not had any labs sine his last visit. He is scheduled to establish care with [facility] tomorrow.   He denies any family h/o heart failure. He denies any heavy alcohol use. He has a drink once a month or so. He denies any recreational drug use.   His hand injuries are  healing well. He is following up with plastic surgery.   He is not on any treatment for his schizophrenia at baseline.    Current Outpatient Medications on File Prior to Visit  Medication Sig Dispense Refill  . acetaminophen (TYLENOL) 325 MG tablet Take 2 tablets (650 mg total) by mouth every 6 (six) hours as needed for mild pain or headache (or Fever >/= 101).    . isosorbide-hydrALAZINE (BIDIL) 20-37.5 MG tablet Take 0.5 tablets by mouth 2 (two) times daily. 180 tablet 1  . metoprolol succinate (TOPROL-XL) 25 MG 24 hr tablet Take 2 tablets (50 mg total) by mouth daily. Take with or immediately following a meal. 30 tablet 3  . Multiple Vitamin (MULTIVITAMIN WITH MINERALS) TABS tablet Take 1 tablet by mouth daily.     No current facility-administered medications on file prior to visit.    Cardiovascular & other pertient studies:  Echocardiogram 03/20/2019: Left ventricle cavity is normal in size. Mild concentric hypertrophy of the left ventricle. severe global hypokinesis. LVEF 30-35%. Doppler evidence of grade I (impaired) diastolic dysfunction, normal LAP.  No significant valvular abnormality.  Normal right atrial pressure.  Compared to previous study on 62/37/6283, RV systolic function is improved. No other significant change noted.   EKG 02/12/2019: Sinus tachycardia 107 bpm. Nonspecific ST-T changes  Recent labs: 02/14/2019: Glucose 102, BUN/Cr 26/3.81. EGFR 19. Na/K 141/3.2. Rest of the CMP normal H/H 10/30. MCV 88. Platelets 344 HbA1C 5.3% TG 136  Labs 2012: Chol 168, TG 225, HDL 32, LDL 91 TSH 1.2 normal  Results for Tyrone Jenkins, Tyrone Jenkins (MRN [id number]) as of 03/26/2019 08:08  Ref. Range 02/12/2019 12:28  02/12/2019 14:13  Troponin I (High Sensitivity) Latest Ref Range: <18 ng/L 289 (HH) 256 (HH)    Review of Systems  Constitution: Negative for decreased appetite, malaise/fatigue, weight gain and weight loss.  HENT: Negative for congestion.   Eyes: Negative for visual  disturbance.  Cardiovascular: Positive for dyspnea on exertion. Negative for chest pain, leg swelling, palpitations and syncope.  Respiratory: Positive for shortness of breath. Negative for cough.   Endocrine: Negative for cold intolerance.  Hematologic/Lymphatic: Does not bruise/bleed easily.  Skin: Negative for itching and rash.  Musculoskeletal: Negative for myalgias.  Gastrointestinal: Negative for abdominal pain, nausea and vomiting.  Genitourinary: Negative for dysuria.  Neurological: Negative for dizziness and weakness.  Psychiatric/Behavioral: The patient is not nervous/anxious.   All other systems reviewed and are negative.       *** There were no vitals filed for this visit.  *** There is no height or weight on file to calculate BMI. There were no vitals filed for this visit.   Objective:   Physical Exam  Constitutional: He is oriented to person, place, and time. He appears well-developed and well-nourished. No distress.  HENT:  Head: Normocephalic and atraumatic.  Eyes: Pupils are equal, round, and reactive to light. Conjunctivae are normal.  Neck: No JVD present.  Cardiovascular: Normal rate, regular rhythm and intact distal pulses.  No murmur heard. Pulmonary/Chest: Effort normal and breath sounds normal. He has no wheezes. He has no rales.  Abdominal: Soft. Bowel sounds are normal. There is no rebound.  Musculoskeletal:        General: No edema.     Comments: Healing injuries on bilateral wrists  Lymphadenopathy:    He has no cervical adenopathy.  Neurological: He is alert and oriented to person, place, and time. No cranial nerve deficit.  Skin: Skin is warm and dry.  Psychiatric:  Flat affect  Nursing note and vitals reviewed.         Assessment & Recommendations:   39 y.o.caucasianmalewith schizophrenia, prolonged hospitalization with cellulitis (b/l wrists), HFrEF, acute kidney injury.  HFrEF: Symptoms include exertional dyspnea NYHA class  III. *** Etiology remains unclear.  Will obtain echo to rule out any reversible causes.  Recommend stress test.  Will check TSH, BNP, CBC, CMP. Continue BiDil,.  Start metoprolol succinate 25 mg daily.  Subject to resolution of acute kidney injury, will start ARNI  Defer management of other medical problems to PCP.    Nigel Mormon, MD [facility] [place] Pager: [phone] Office: [phone]

## 2020-07-08 IMAGING — CT CT FOREARM*L* W/O CM
2 of 3 series · 13 of 33 positions shown, 16 images · non-contrast
Comparison: X-ray 01/27/2019

CLINICAL DATA: Gangrenous wound of the left forearm

EXAM:
CT OF THE LEFT FOREARM WITHOUT CONTRAST
TECHNIQUE: Multidetector CT imaging was performed according to the standard
protocol. Multiplanar CT image reconstructions were also generated.

[Series 3: axial st · axial · 0.27mm/px · z∈[-526,-238]mm · 10 of 172 slices shown, 13 images]
[im 14/172  soft-tissue]
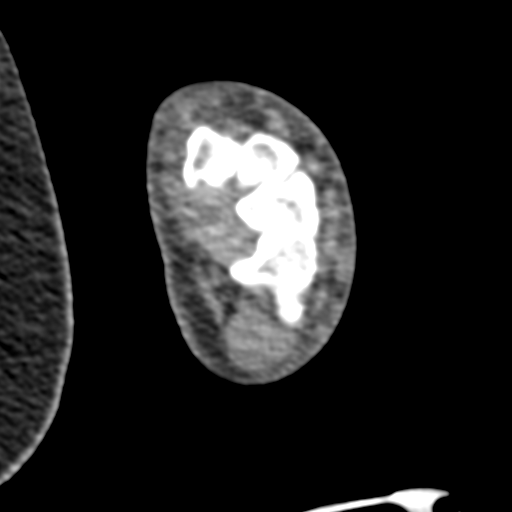
[im 14/172  bone]
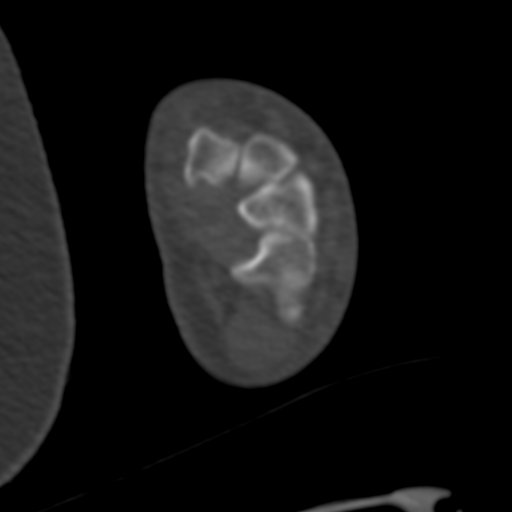
[im 27/172  bone]
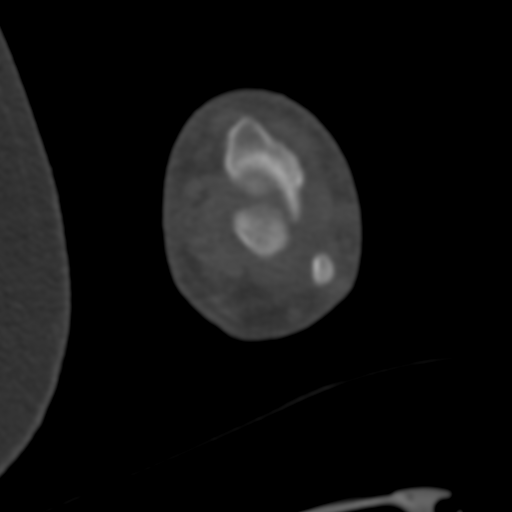
[im 53/172  bone]
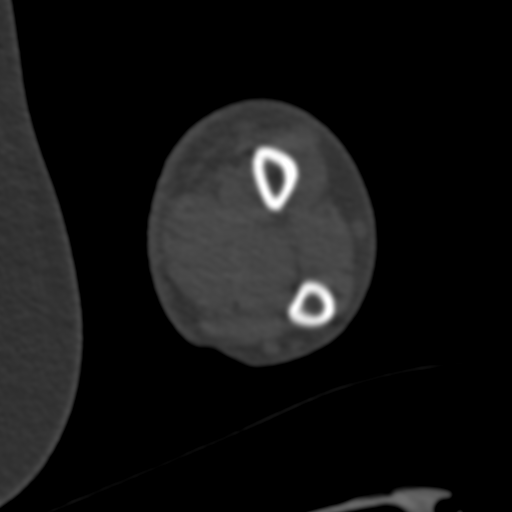
[im 66/172  bone]
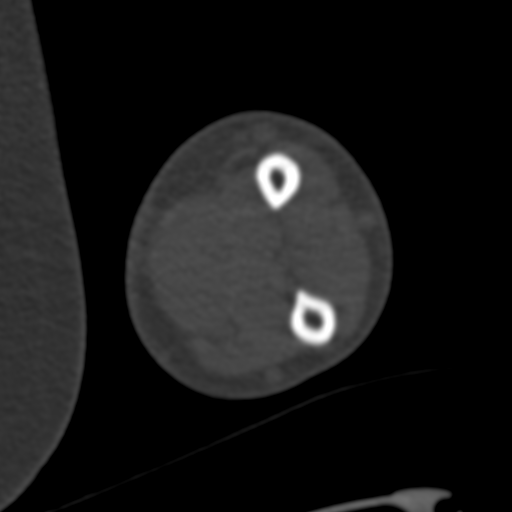
[im 79/172  soft-tissue]
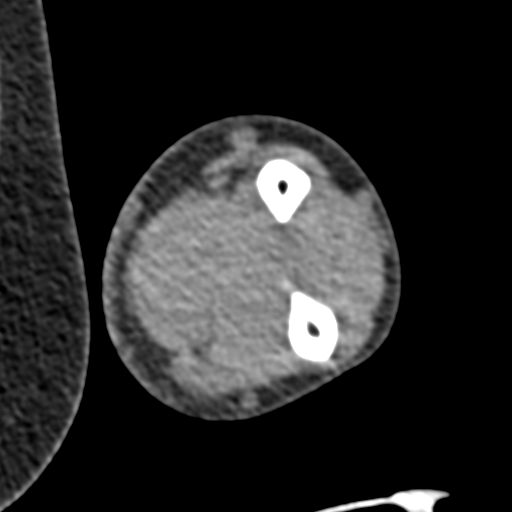
[im 79/172  bone]
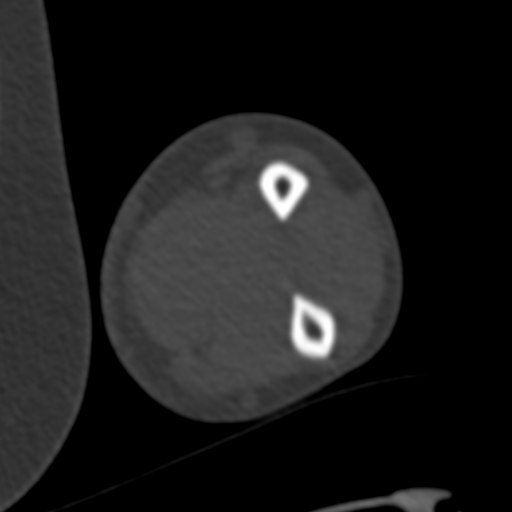
[im 93/172  bone]
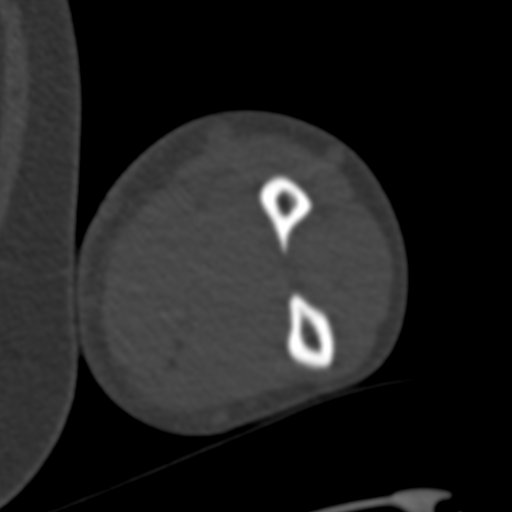
[im 106/172  bone]
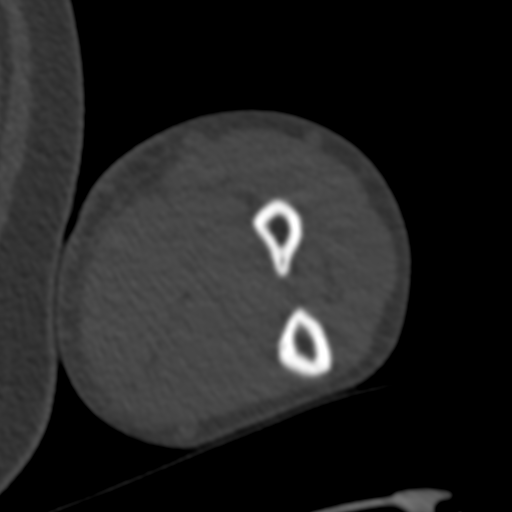
[im 132/172  bone]
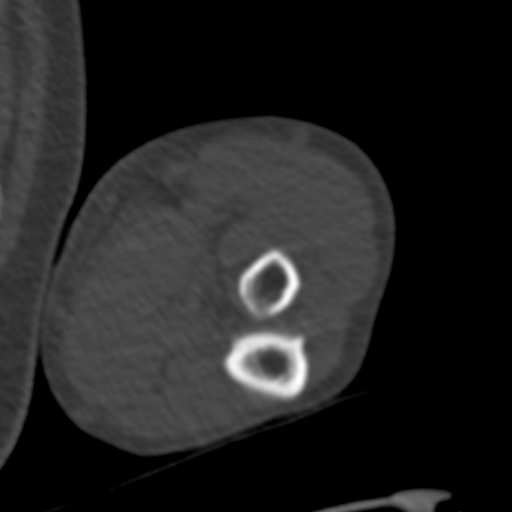
[im 145/172  soft-tissue]
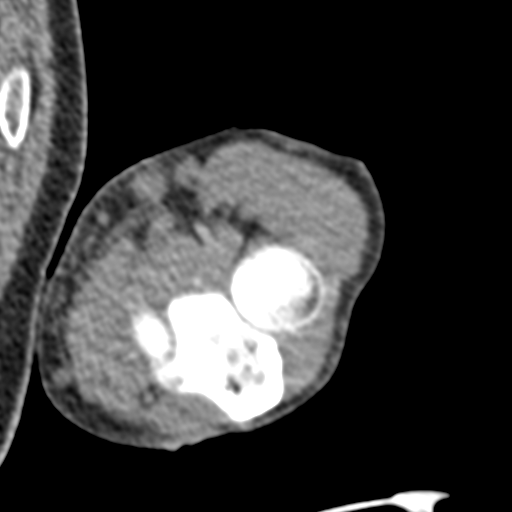
[im 145/172  bone]
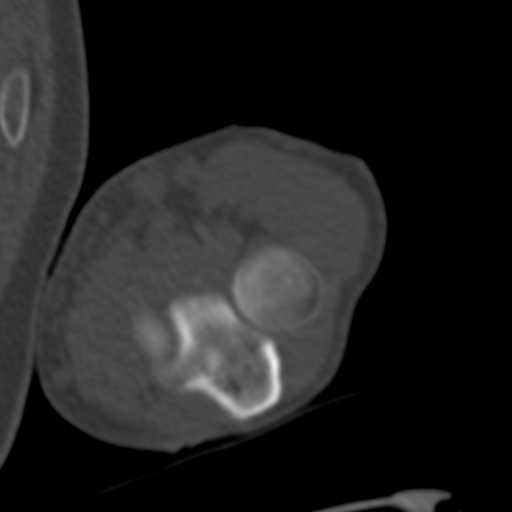
[im 158/172  bone]
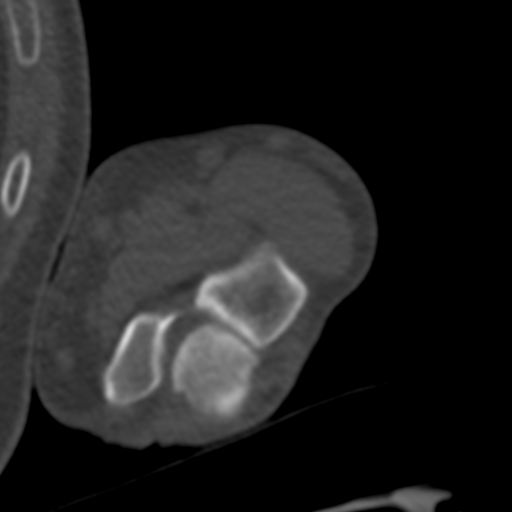

[Series 9: cor st · coronal · 0.31mm/px · 3 of 52 slices shown]
[im 11/52  bone]
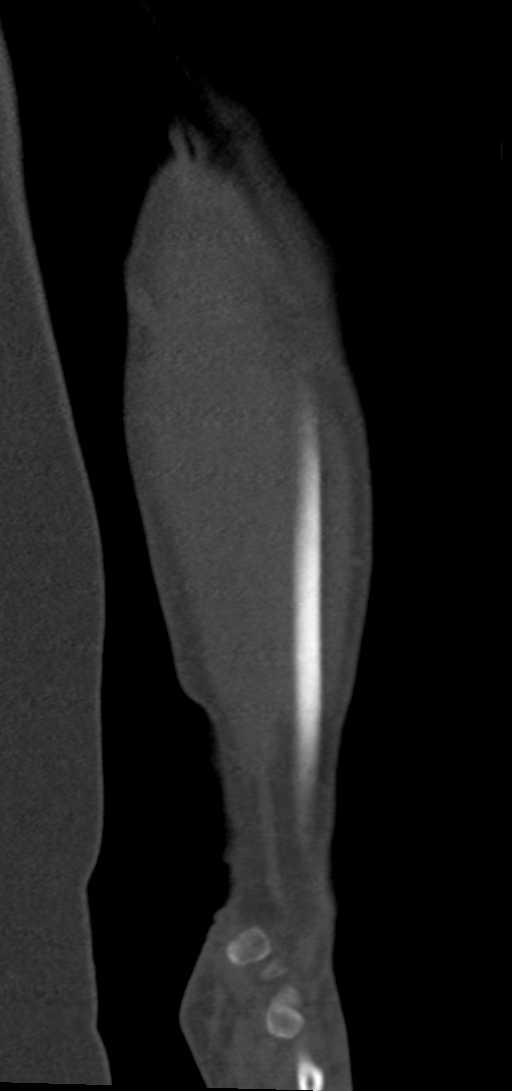
[im 21/52  bone]
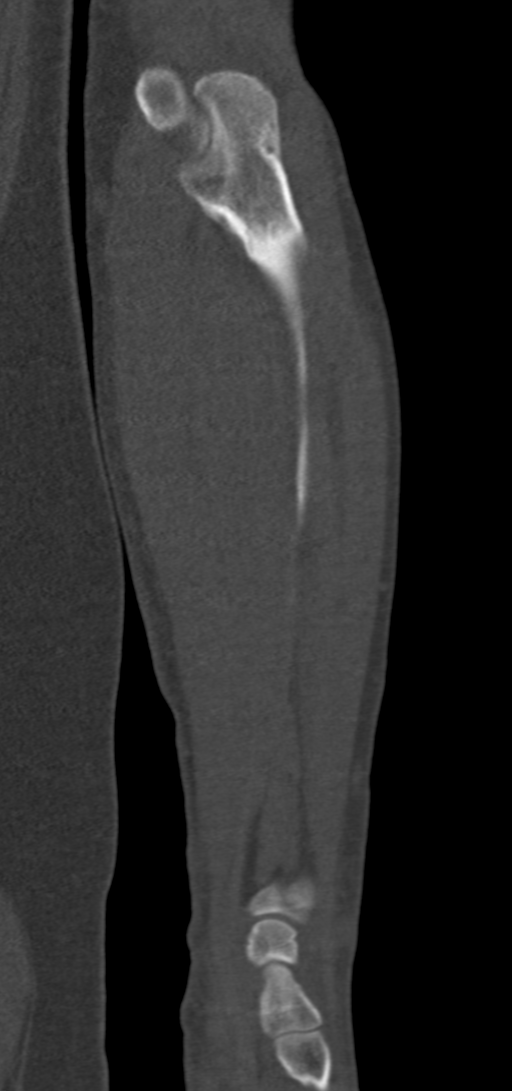
[im 31/52  bone]
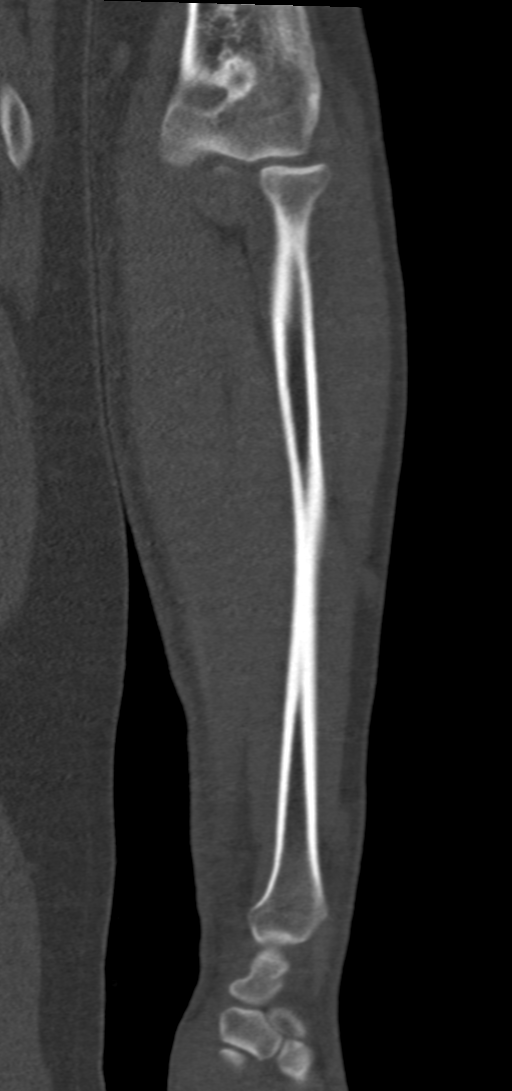

[13 of 33 positions shown; findings below may reference images not displayed]

FINDINGS: Bones/Joint/Cartilage

No acute fracture or dislocation. No cortical erosion or
periostitis. No focal bone lesion. Joint spaces of the elbow and
wrist are maintained. No elbow joint effusion.

Ligaments

Suboptimally assessed by CT.

Muscles and Tendons

Muscular bulk is maintained without atrophy or fatty infiltration.
Tendinous structures intact within the limitations of this exam. No
large tenosynovial fluid collection is evident.

Soft tissues

There is skin defect overlying the radial aspect of the distal
forearm at the level of the distal radial metaphysis with underlying
soft tissue induration (series 3, image 47). No underlying fluid
collection or soft tissue gas. There is mild subcutaneous edema
within the surrounding tissues. No deep fascial fluid is evident by
CT. No radiopaque soft tissue foreign bodies.
IMPRESSION: 1. Skin defect overlying the radial aspect of the distal forearm at
the level of the distal radial metaphysis with underlying soft
tissue induration compatible with known ulceration and associated
cellulitis. No underlying fluid collection/abscess or soft tissue
gas.
2. No acute osseous abnormality or CT evidence of osteomyelitis.

## 2020-07-21 IMAGING — DX DG CHEST 1V PORT
1 series · 1 of 1 positions shown · non-contrast
Comparison: Earlier today

CLINICAL DATA: Intubation. New onset renal failure with acute
decompensation.

EXAM:
PORTABLE CHEST 1 VIEW

[chest ap]
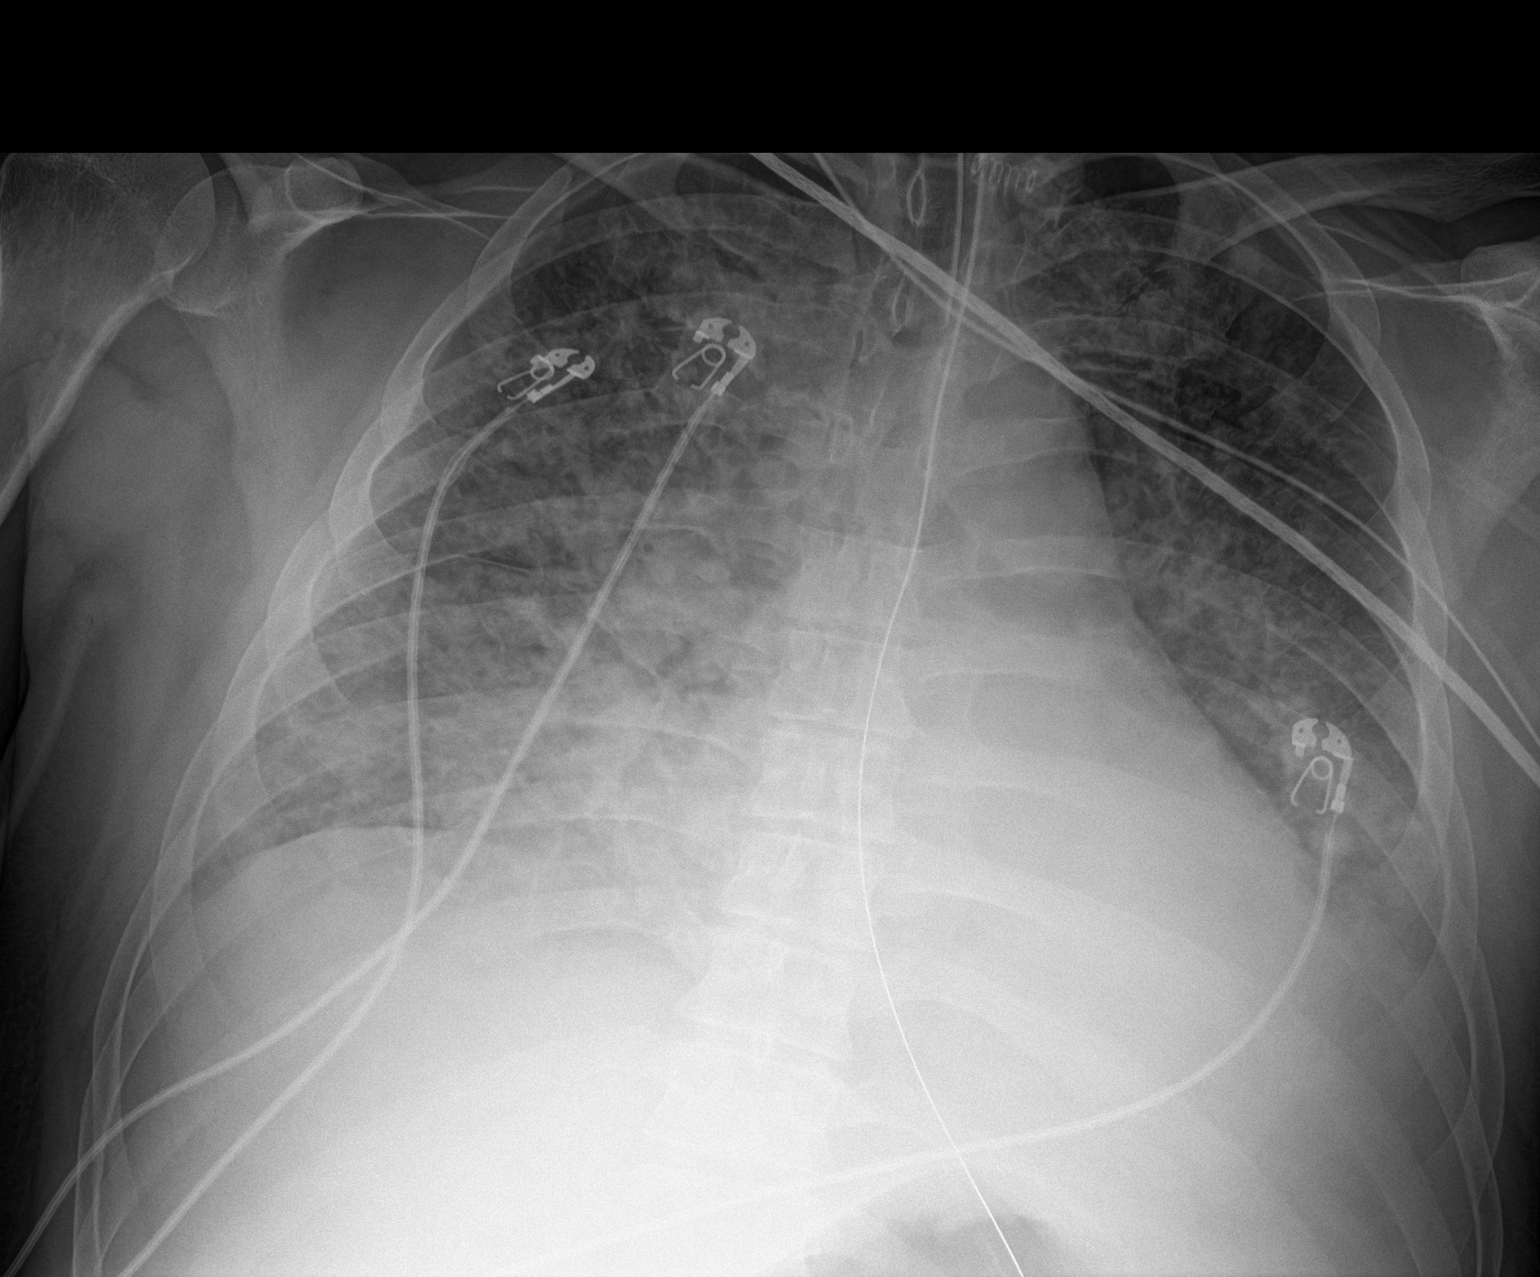

[1 of 1 positions shown; findings below may reference images not displayed]

FINDINGS: New endotracheal tube with tip just below the clavicular heads. The
orogastric tube at least reaches the stomach. Bilateral airspace
disease with interval worsening. There is likely pleural fluid,
especially on the left. Prominent cardiac size accentuated by
technique. No visible pneumothorax.
IMPRESSION: 1. Unremarkable hardware positioning.
2. Worsening pulmonary edema.

## 2020-07-21 IMAGING — DX DG ABD PORTABLE 1V
1 series · 1 of 1 positions shown · non-contrast
Comparison: None.

CLINICAL DATA: Orogastric tube placement.

EXAM:
PORTABLE ABDOMEN - 1 VIEW

[abdomen kub]
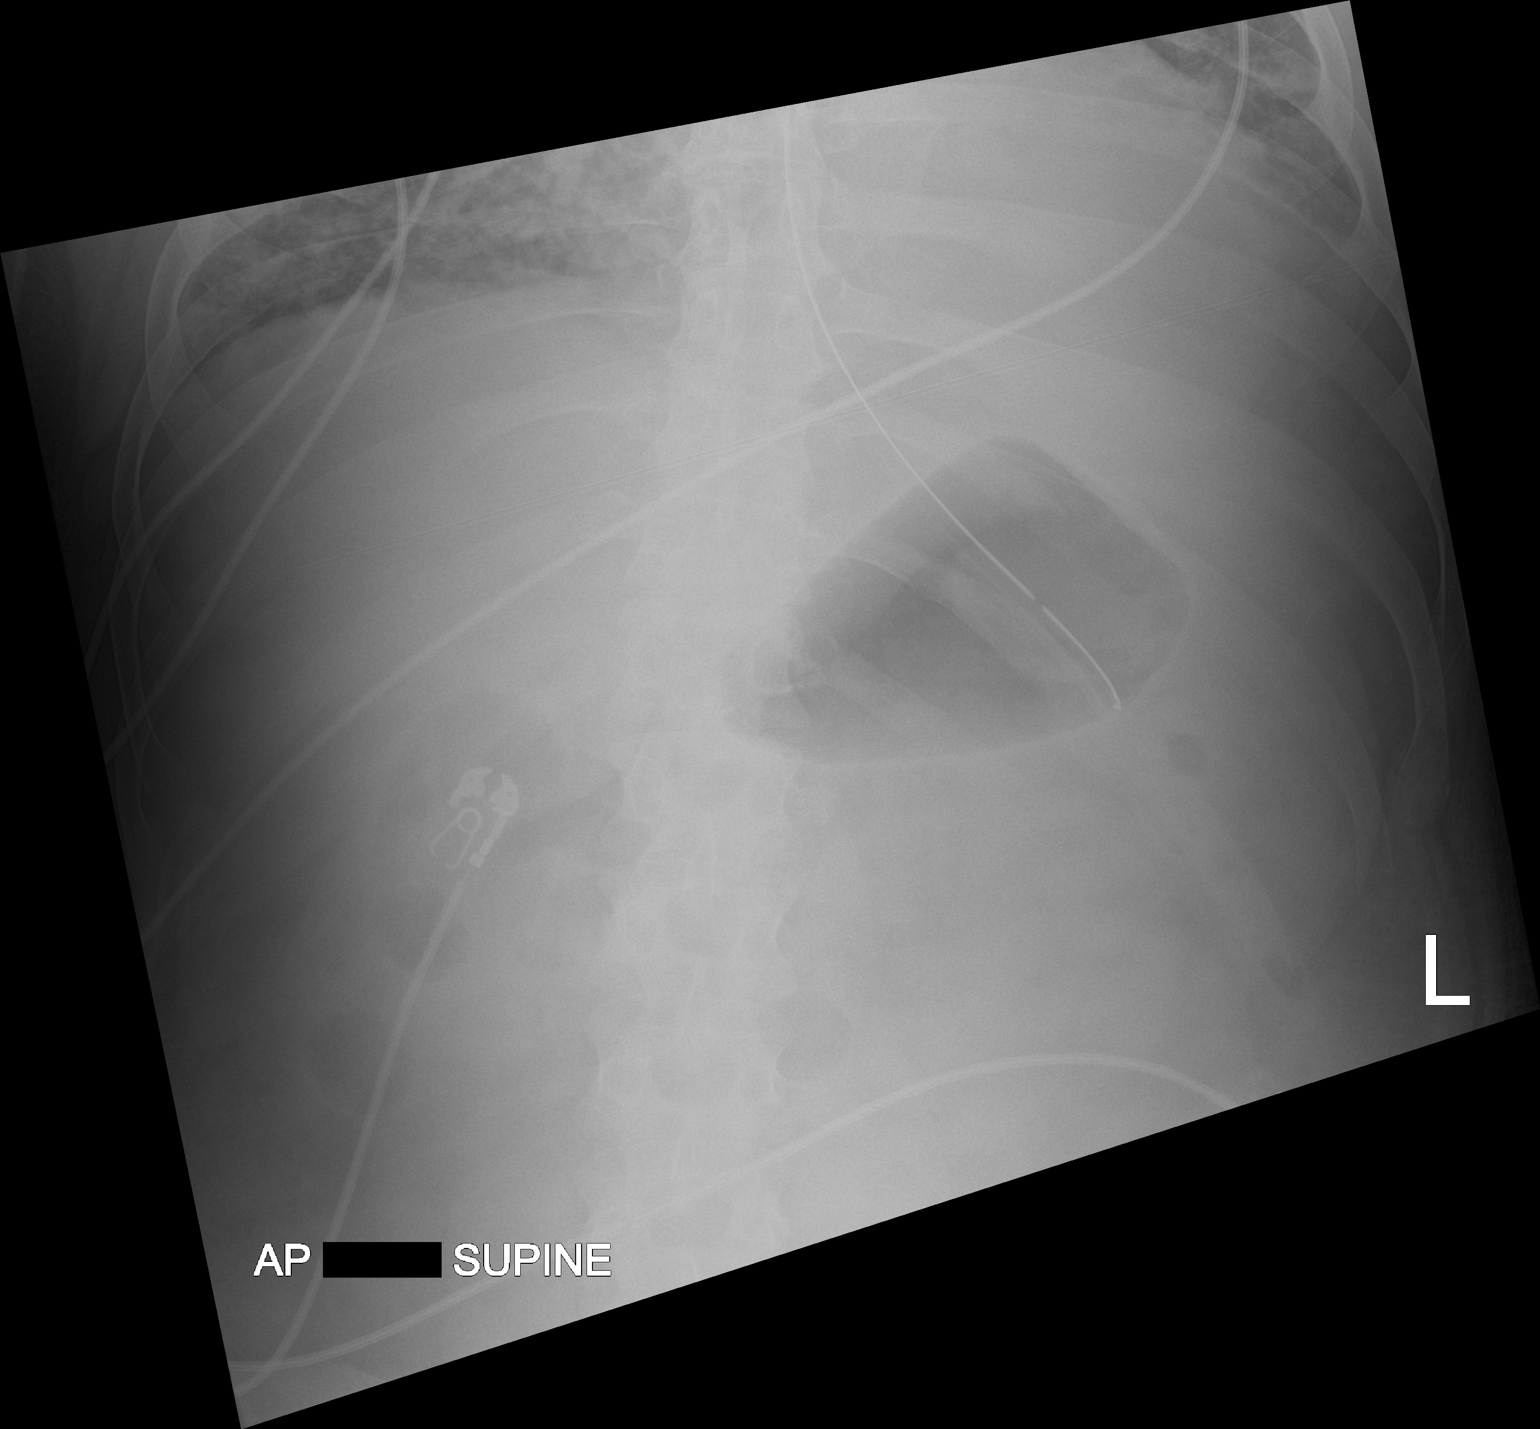

[1 of 1 positions shown; findings below may reference images not displayed]

FINDINGS: Nasogastric terminates at the body of the stomach. Left lower lobe
airspace disease. No gross free intraperitoneal air or bowel
obstruction. Only the upper abdomen is imaged.
IMPRESSION: Nasogastric tube terminating at the body of the stomach.

## 2023-01-20 ENCOUNTER — Encounter (HOSPITAL_COMMUNITY): Payer: Self-pay

## 2023-01-20 ENCOUNTER — Emergency Department (HOSPITAL_COMMUNITY): Payer: MEDICAID

## 2023-01-20 ENCOUNTER — Emergency Department (HOSPITAL_COMMUNITY)
Admission: EM | Admit: 2023-01-20 | Discharge: 2023-01-24 | Disposition: A | Discharge status: Other Acute Inpatient Hospital | Payer: MEDICAID | Attending: Emergency Medicine | Admitting: Emergency Medicine

## 2023-01-20 ENCOUNTER — Other Ambulatory Visit: Payer: Self-pay

## 2023-01-20 DIAGNOSIS — F061 Catatonic disorder due to known physiological condition: Secondary | ICD-10-CM | POA: Diagnosis not present

## 2023-01-20 DIAGNOSIS — Z87891 Personal history of nicotine dependence: Secondary | ICD-10-CM | POA: Insufficient documentation

## 2023-01-20 DIAGNOSIS — F202 Catatonic schizophrenia: Secondary | ICD-10-CM | POA: Diagnosis not present

## 2023-01-20 DIAGNOSIS — Z20822 Contact with and (suspected) exposure to covid-19: Secondary | ICD-10-CM | POA: Diagnosis not present

## 2023-01-20 DIAGNOSIS — F209 Schizophrenia, unspecified: Secondary | ICD-10-CM | POA: Diagnosis present

## 2023-01-20 DIAGNOSIS — R4182 Altered mental status, unspecified: Secondary | ICD-10-CM | POA: Diagnosis present

## 2023-01-20 HISTORY — DX: Catatonic disorder due to known physiological condition: F06.1

## 2023-01-20 LAB — CBG MONITORING, ED: Glucose-Capillary: 96 mg/dL (ref 70–99)

## 2023-01-20 LAB — CBC WITH DIFFERENTIAL/PLATELET
Abs Immature Granulocytes: 0.03 10*3/uL (ref 0.00–0.07)
Basophils Absolute: 0.1 10*3/uL (ref 0.0–0.1)
Basophils Relative: 1 %
Eosinophils Absolute: 0 10*3/uL (ref 0.0–0.5)
Eosinophils Relative: 0 %
HCT: 50.6 % (ref 39.0–52.0)
Hemoglobin: 16.6 g/dL (ref 13.0–17.0)
Immature Granulocytes: 0 %
Lymphocytes Relative: 22 %
Lymphs Abs: 2.3 10*3/uL (ref 0.7–4.0)
MCH: 30.1 pg (ref 26.0–34.0)
MCHC: 32.8 g/dL (ref 30.0–36.0)
MCV: 91.8 fL (ref 80.0–100.0)
Monocytes Absolute: 1.2 10*3/uL — ABNORMAL HIGH (ref 0.1–1.0)
Monocytes Relative: 11 %
Neutro Abs: 6.9 10*3/uL (ref 1.7–7.7)
Neutrophils Relative %: 66 %
Platelets: 329 10*3/uL (ref 150–400)
RBC: 5.51 MIL/uL (ref 4.22–5.81)
RDW: 13.9 % (ref 11.5–15.5)
WBC: 10.5 10*3/uL (ref 4.0–10.5)
nRBC: 0 % (ref 0.0–0.2)

## 2023-01-20 LAB — COMPREHENSIVE METABOLIC PANEL
ALT: 22 U/L (ref 0–44)
AST: 39 U/L (ref 15–41)
Albumin: 5.2 g/dL — ABNORMAL HIGH (ref 3.5–5.0)
Alkaline Phosphatase: 55 U/L (ref 38–126)
Anion gap: 14 (ref 5–15)
BUN: 39 mg/dL — ABNORMAL HIGH (ref 6–20)
CO2: 23 mmol/L (ref 22–32)
Calcium: 9.4 mg/dL (ref 8.9–10.3)
Chloride: 101 mmol/L (ref 98–111)
Creatinine, Ser: 1.1 mg/dL (ref 0.61–1.24)
GFR, Estimated: >60 mL/min (ref >60)
Glucose, Bld: 107 mg/dL — ABNORMAL HIGH (ref 70–99)
Potassium: 4 mmol/L (ref 3.5–5.1)
Sodium: 138 mmol/L (ref 135–145)
Total Bilirubin: 4.7 mg/dL — ABNORMAL HIGH (ref 0.3–1.2)
Total Protein: 8.2 g/dL — ABNORMAL HIGH (ref 6.5–8.1)

## 2023-01-20 LAB — ETHANOL: Alcohol, Ethyl (B): <10 mg/dL (ref <10)

## 2023-01-20 LAB — ACETAMINOPHEN LEVEL: Acetaminophen (Tylenol), Serum: <10 ug/mL — ABNORMAL LOW (ref 10–30)

## 2023-01-20 LAB — SALICYLATE LEVEL: Salicylate Lvl: <7 mg/dL — ABNORMAL LOW (ref 7.0–30.0)

## 2023-01-20 MED ORDER — OLANZAPINE 5 MG PO TBDP: 5.0000 mg | ORAL_TABLET | Freq: Three times a day (TID) | ORAL | Status: DC | PRN

## 2023-01-20 MED ORDER — LORAZEPAM 1 MG PO TABS: 1.0000 mg | ORAL_TABLET | ORAL | Status: DC | PRN

## 2023-01-20 MED ORDER — ZIPRASIDONE MESYLATE 20 MG IM SOLR: 20.0000 mg | INTRAMUSCULAR | Status: DC | PRN

## 2023-01-20 MED ORDER — ARIPIPRAZOLE 10 MG PO TABS
10.0000 mg | ORAL_TABLET | Freq: Every day | ORAL | Status: DC
  Administered 2023-01-21 – 2023-01-23 (×3): 10 mg via ORAL
  Filled 2023-01-20 (×3): qty 1

## 2023-01-20 MED ORDER — MIRTAZAPINE 7.5 MG PO TABS
7.5000 mg | ORAL_TABLET | Freq: Every day | ORAL | Status: DC
  Administered 2023-01-21 – 2023-01-23 (×3): 7.5 mg via ORAL
  Filled 2023-01-20 (×3): qty 1

## 2023-01-20 MED ORDER — LORAZEPAM 2 MG/ML IJ SOLN
1.0000 mg | Freq: Three times a day (TID) | INTRAMUSCULAR | Status: DC
  Administered 2023-01-21 (×2): 1 mg via INTRAVENOUS
  Filled 2023-01-20 (×2): qty 1

## 2023-01-20 MED ORDER — LORAZEPAM 2 MG/ML PO CONC
1.0000 mg | Freq: Three times a day (TID) | ORAL | Status: DC
Start: 2023-01-20 — End: 2023-01-20

## 2023-01-20 MED ORDER — CLONAZEPAM 0.5 MG PO TABS: 1.0000 mg | ORAL_TABLET | Freq: Two times a day (BID) | ORAL | Status: DC

## 2023-01-20 NOTE — ED Notes (Signed)
Pt dressed up to [facility] [address] scrubs, belongings secured to locker 31

## 2023-01-20 NOTE — Consult Note (Signed)
BH ED ASSESSMENT    Reason for Consult:  Catatonia Referring Physician:  Jodi Geralds, PA-C  Patient Identification: Tyrone Jenkins MRN:  [id number] ED Chief Complaint: Catatonia  Diagnosis:  Principal Problem:   Catatonia Active Problems:   Schizophrenia, unspecified [facility])   ED Assessment Time Calculation: Start Time: 1700 Stop Time: 1740 Total Time in Minutes (Assessment Completion): 40   Subjective:  Tyrone Jenkins is a 41 y.o. male patient admitted with a history of severe depression with psychotic features and catatonia, who presents via EMS after family found him standing in the hallway and not moving for 2 hours.    HPI: Tyrone Jenkins, 42 y.o., male patient seen face to face by this provider, consulted with Dr. Lucianne Muss; and chart reviewed on 01/20/23.  On evaluation Tyrone Jenkins is laying in the hospital gurney, on his back, not responding to any questions or commands.  Patient is laying in the bed with his eyes closed, and bilateral face balled up, patient left foot is moving slowly up and down.  Patient's mother and father are at patient's bedside, she states that she and her husband found him standing in his hallway in his apartment and not moving for about 2 hours.  Patient is not responding to touch, any questions and does not participate in history.  Patient's parents report that he lives alone, states that the last time they heard him speak was on Monday 01/17/2023, his mother states the last day she saw him functional, moving around talking "normal "was on Saturday, 01/15/2023, they state they do not know what happened on Sunday with him as they do not see him on Sunday.  Patient's mother states that patient was in what appeared a catatonic state about 14 years ago when this first started, states at that time he just did not speak, and was severely depressed and stressed as he was going through a divorce.  She states currently that patient had been doing well for 3  years, not needing medications, up until March 2024 when he began to have catatonic states, states this is his third catatonic state since March 2024.  She states then in April he was sent to [facility] due to catatonia, they prescribed him hydroxyzine 50 mg p.o. 3 times daily as needed, Remeron 15 mg p.o. at bedtime, and trazodone 50 mg p.o. at bedtime, states these medications were too strong, per mother states that he was "walking around like a zombie."They report that patient does not use any drugs, alcohol, or nicotine.  States he has been seeing a psychiatric Clinical cytogeneticist at [facility] recovery services in [place], [place], where he was referred to by [facility].  Parents state that Excell Seltzer, NP tried to assess patient while he was in a catatonic state, and would not change his medications, so patient stopped seeing her. They state that he was last admitted to an inpatient psychiatric facility last month September 2024 to a facility in [facility], due to catatonia, they state that he has been without his new medications which include clonazepam 1 mg twice daily, Abilify 10 mg p.o. at bedtime, and Remeron 7.5 mg p.o. at bedtime for about a week.  His mother states that these medications seem to help patient, and then because he missed a week of medications this is why he fell back into his catatonic state.  She states patient has not been getting a lot of sleep, due to anxiety, and the last time  that he ate was this morning they attempted to feed him a biscuit, and he ate half of it.  Per Chart Review: patient has multiple psychiatric hospitalizations. There is no known family history of mental health issues or suicide. Patient cannot answer if he has had a history of trauma or abuse. He lives alone and goes to friends for help and support. He is work in an [employer], trade [employer] and has some college education. He denies legal issues, access to firearms, or [employer]   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, Bizarre behavior, Non-traumatic rhabdomyolysis   Risk to Self or Others: Risk to Self:  Yes  Risk to Others:  No  Prior Inpatient Therapy:  Yes  Prior Outpatient Therapy: Yes   [place] Scale:  Flowsheet Row ED from 01/20/2023 in [facility] Emergency Department at [facility]  C-SSRS RISK CATEGORY No Risk       AIMS:  , , ,  ,   ASAM:    Substance Abuse:     Past Medical History:  Past Medical History:  Diagnosis Date   Catatonia    Cellulitis 01/2019   both wrist   Schizophrenia [facility])     Past Surgical History:  Procedure Laterality Date   INCISION AND DRAINAGE OF WOUND Left 02/09/2019   Procedure: debridement of left wrist wound with Acell placement;  Surgeon: Allena Napoleon, MD;  Location: [facility] OR;  Service: Plastics;  Laterality: Left;  1 hour   Family History: History reviewed. No pertinent family history. Family Psychiatric  History: There is no known family history of mental health issues or suicide.    Social History:  Social History   Substance and Sexual Activity  Alcohol Use Yes   Comment: social     Social History   Substance and Sexual Activity  Drug Use Never    Social History   Socioeconomic History   Marital status: Single    Spouse name: Not on file   Number of children: 2   Years of education: Not on file   Highest education level: Not on file  Occupational History   Not on file  Tobacco Use   Smoking status: Former   Smokeless tobacco: Never  Vaping Use   Vaping status: Never Used  Substance and Sexual Activity   Alcohol use: Yes    Comment: social   Drug use: Never   Sexual activity: Not on file  Other Topics Concern   Not on file  Social History Narrative   ** Merged History Encounter **       Social Determinants of Health   Financial Resource Strain: Not on file  Food  Insecurity: Patient Unable To Answer (07/16/2022)   Received from [facility]   Hunger Vital Sign    Worried About Running Out of Food in the Last Year: Patient unable to answer    Ran Out of Food in the Last Year: Patient unable to answer  Transportation Needs: Patient Unable To Answer (07/16/2022)   Received from [facility] - Transportation    Lack of Transportation (Medical): Patient unable to answer    Lack of Transportation (Non-Medical): Patient unable to answer  Physical Activity: Not on file  Stress: Not on file  Social Connections: Unknown (08/04/2021)   Received from [facility]   Social Network    Social Network: Not on file       Allergies:  Allergies  Allergen Reactions   Haloperidol Other (See Comments)    Shaking    Labs:  Results for orders placed or performed during the hospital encounter of 01/20/23 (from the past 48 hour(s))  Comprehensive metabolic panel     Status: Abnormal   Collection Time: 01/20/23  2:21 PM  Result Value Ref Range   Sodium 138 135 - 145 mmol/L   Potassium 4.0 3.5 - 5.1 mmol/L   Chloride 101 98 - 111 mmol/L   CO2 23 22 - 32 mmol/L   Glucose, Bld 107 (H) 70 - 99 mg/dL    Comment: Glucose reference range applies only to samples taken after fasting for at least 8 hours.   BUN 39 (H) 6 - 20 mg/dL   Creatinine, Ser 1.91 0.61 - 1.24 mg/dL   Calcium 9.4 8.9 - 47.8 mg/dL   Total Protein 8.2 (H) 6.5 - 8.1 g/dL   Albumin 5.2 (H) 3.5 - 5.0 g/dL   AST 39 15 - 41 U/L   ALT 22 0 - 44 U/L   Alkaline Phosphatase 55 38 - 126 U/L   Total Bilirubin 4.7 (H) 0.3 - 1.2 mg/dL   GFR, Estimated >29 >56 mL/min    Comment: (NOTE) Calculated using the CKD-EPI Creatinine Equation (2021)    Anion gap 14 5 - 15    Comment: Performed at [facility], [address], [place], [place] [address]  Ethanol     Status: None   Collection Time: 01/20/23  2:21 PM  Result Value Ref Range   Alcohol, Ethyl (B) <10 <10 mg/dL     Comment: (NOTE) Lowest detectable limit for serum alcohol is 10 mg/dL.  For medical purposes only. Performed at [facility], [address] [place], [place] [address]   CBC with Diff     Status: Abnormal   Collection Time: 01/20/23  2:21 PM  Result Value Ref Range   WBC 10.5 4.0 - 10.5 K/uL   RBC 5.51 4.22 - 5.81 MIL/uL   Hemoglobin 16.6 13.0 - 17.0 g/dL   HCT 69.6 29.5 - 28.4 %   MCV 91.8 80.0 - 100.0 fL   MCH 30.1 26.0 - 34.0 pg   MCHC 32.8 30.0 - 36.0 g/dL   RDW 13.2 44.0 - 10.2 %   Platelets 329 150 - 400 K/uL   nRBC 0.0 0.0 - 0.2 %   Neutrophils Relative % 66 %   Neutro Abs 6.9 1.7 - 7.7 K/uL   Lymphocytes Relative 22 %   Lymphs Abs 2.3 0.7 - 4.0 K/uL   Monocytes Relative 11 %   Monocytes Absolute 1.2 (H) 0.1 - 1.0 K/uL   Eosinophils Relative 0 %   Eosinophils Absolute 0.0 0.0 - 0.5 K/uL   Basophils Relative 1 %   Basophils Absolute 0.1 0.0 - 0.1 K/uL   Immature Granulocytes 0 %   Abs Immature Granulocytes 0.03 0.00 - 0.07 K/uL    Comment: Performed at [facility], [address] [place], [place] [address]  Acetaminophen level     Status: Abnormal   Collection Time: 01/20/23  2:21 PM  Result Value Ref Range   Acetaminophen (Tylenol), Serum <10 (L) 10 - 30 ug/mL    Comment: (NOTE) Therapeutic concentrations vary significantly. A range of 10-30 ug/mL  may be an effective concentration for many patients. However, some  are best treated at concentrations outside of this range. Acetaminophen concentrations >150 ug/mL at 4 hours after ingestion  and >  50 ug/mL at 12 hours after ingestion are often associated with  toxic reactions.  Performed at [facility], [address], [place], [place] [address]   Salicylate level     Status: Abnormal   Collection Time: 01/20/23  2:21 PM  Result Value Ref Range   Salicylate Lvl <7.0 (L) 7.0 - 30.0 mg/dL    Comment: Performed at [facility], [address], [place], [place] [address]    Current Facility-Administered Medications  Medication Dose Route Frequency Provider Last Rate Last Admin   LORazepam (ATIVAN) 2 MG/ML concentrated solution 1 mg  1 mg Oral TID Motley-Mangrum, Ezra Sites, PMHNP       Current Outpatient Medications  Medication Sig Dispense Refill   ARIPiprazole (ABILIFY) 10 MG tablet Take 10 mg by mouth at bedtime.     clonazePAM (KLONOPIN) 1 MG tablet Take 1 mg by mouth 2 (two) times daily.     mirtazapine (REMERON) 7.5 MG tablet Take 7.5 mg by mouth at bedtime.     ZYRTEC ALLERGY 10 MG tablet Take 10 mg by mouth daily as needed for allergies or rhinitis.     acetaminophen (TYLENOL) 325 MG tablet Take 2 tablets (650 mg total) by mouth every 6 (six) hours as needed for mild pain or headache (or Fever >/= 101).     isosorbide-hydrALAZINE (BIDIL) 20-37.5 MG tablet Take 0.5 tablets by mouth 2 (two) times daily. (Patient not taking: Reported on 01/20/2023) 180 tablet 1   metoprolol succinate (TOPROL-XL) 25 MG 24 hr tablet Take 2 tablets (50 mg total) by mouth daily. Take with or immediately following a meal. (Patient not taking: Reported on 01/20/2023) 30 tablet 3    Musculoskeletal: Strength & Muscle Tone: within normal limits Gait & Station: normal Patient leans: N/A   Psychiatric Specialty Exam: Presentation  General Appearance:  Neat  Eye Contact: Other (comment) (patient eyes closed, patient apeared to be catatonic)  Speech: Other (comment); Blocked (patient apeared to be catatonic)  Speech Volume: Other (comment)  Handedness: -- (patient apeared to be catatonic)   Mood and Affect  Mood: -- (patient apeared to be catatonic)  Affect: -- (patient apeared to be catatonic)   Thought Process  Thought Processes: Other (comment) (patient apeared to be catatonic)  Descriptions of Associations:-- (patient apeared to be catatonic)  Orientation:No data recorded Thought Content:Other (comment) (unable  to assess, patient apeared to be catatonic)  History of Schizophrenia/Schizoaffective disorder:No data recorded Duration of Psychotic Symptoms:No data recorded Hallucinations:Hallucinations: Other (comment) (unable to assess, patient apeared to be catatonic)  Ideas of Reference:None (unable to assess, patient apeared to be catatonic)  Suicidal Thoughts:Suicidal Thoughts: -- (unable to assess, patient apeared to be catatonic)  Homicidal Thoughts:Homicidal Thoughts: -- (unable to assess, patient apeared to be catatonic)   Sensorium  Memory: Other (comment) (unable to assess, patient apeared to be catatonic)  Judgment: Other (comment) (unable to assess, patient apeared to be catatonic)  Insight: Other (comment) (unable to assess, patient apeared to be catatonic)   Executive Functions  Concentration: Other (comment) (unable to assess, patient apeared to be catatonic)  Attention Span: Other (comment)  Recall: Other (comment) (unable to assess, patient apeared to be catatonic)  Fund of Knowledge: Other (comment)  Language: Other (comment) (unable to assess, patient apeared to be catatonic)   Psychomotor Activity  Psychomotor Activity: Psychomotor Activity: Other (comment); Decreased; Psychomotor Retardation   Assets  Assets: Desire for Improvement; Housing; Social Support    Sleep  Sleep: Sleep: -- (  unable to assess, patient apeared to be catatonic)   Physical Exam: Physical Exam Vitals and nursing note reviewed. Exam conducted with a chaperone present.  Neurological:     Mental Status: He is alert.  Psychiatric:     Comments: Patient not responding to commands, questions or touch.     Review of Systems  Psychiatric/Behavioral:  Positive for depression.    Blood pressure (!) 151/82, pulse 100, temperature 99.1 F (37.3 C), temperature source Oral, resp. rate 18, height 6' (1.829 m), weight 96 kg, SpO2 95%. Body mass index is 28.7 kg/m.   Medical  Decision Making: Patient case review and discussed with Dr. Lucianne Muss. Patient needs inpatient psychiatric admission for stabilization and treatment. Patient was nonverbal throughout psychiatric evaluation.  When attempting to assess and compare yesterday's progression, patient was unable to participate and remained nonverbal.  Due to patient's change in her behavior, responsiveness, and psychiatric presentation catatonia screening was performed in which patient rated at 11 on catatonia Screening instrument. Will start patient on Ativan 1 mg IM TID for catatonia symptoms.     Alona Bene, PMHNP 01/20/2023 6:02 PM

## 2023-01-20 NOTE — ED Notes (Signed)
Carley Hammed from [facility] called and ask for updates

## 2023-01-20 NOTE — ED Provider Notes (Addendum)
Patient with history of catatonia and bipolar disorder with psychotic features, recently required psychiatric admission for his catatonia on 11/24/22.  Presenting with catatonic like symptoms, similar to previous episode per family.  Awaiting medical clearance and TTS consult. Physical Exam  BP (!) 151/82 (BP Location: Left Arm)   Pulse 100   Temp 99.1 F (37.3 C) (Oral)   Resp 18   Ht 6' (1.829 m)   Wt 96 kg   SpO2 95%   BMI 28.70 kg/m   Physical Exam Vitals and nursing note reviewed.  Constitutional:      Appearance: Normal appearance.     Comments: Being upon my examination, reacting slightly to shaking him.  Slowed movements.  Not talking.  HENT:     Head: Atraumatic.  Cardiovascular:     Rate and Rhythm: Normal rate and regular rhythm.  Pulmonary:     Effort: Pulmonary effort is normal.  Abdominal:     General: Bowel sounds are normal.     Comments: Abdomen is soft and nontender to palpation.  No distention.  Negative Murphy sign.  Neurological:     General: No focal deficit present.  Psychiatric:        Mood and Affect: Mood normal.        Behavior: Behavior normal.     Procedures  Procedures  ED Course / MDM    Medical Decision Making Amount and/or Complexity of Data Reviewed Labs: ordered. Radiology: ordered.  Risk Prescription drug management.   Patient overall well-appearing, no acute distress.  Vital signs remained stable.  He was awaiting medical clearance for TTS consult.  Labs overall looked reassuring, however, did have an elevated bilirubin at 4.7.  Upon reviewing previous labs, appears he has an elevated bilirubin at baseline, but this is a little bit more elevated than normal.  Right upper quadrant ultrasound was obtained which showed no evidence of cholelithiasis or cholecystitis.    Patient cleared for TTS consult at this time.   Addendum 7:18 PM.  TTS evaluated patient, recommended psychiatric admission.  Home medications ordered.       Arabella Merles, PA-C 01/20/23 1821    Arabella Merles, PA-C 01/20/23 1919    Arby Barrette, MD 01/20/23 2155

## 2023-01-20 NOTE — ED Triage Notes (Addendum)
Patient BIB [facility] from home. History of catatonia. Family called out saying patient was standing in the hallway not moving for 2 hours. Family tried to get patient to eat/sit down. Patient not answering to any questions. Family said last time patient acted this way ativan helped him.   EMS 20G left hand

## 2023-01-20 NOTE — Progress Notes (Signed)
LCSW Progress Note  [id number]   Tyrone Jenkins  01/20/2023  8:34 PM    Inpatient Behavioral Health Placement  Pt meets inpatient criteria per Alona Bene, PMHNP. There are no available beds within [facility] per [facility] [employer]. Referral was sent to the following facilities;   Destination  Service Provider Address Phone [facility]  [address] [place], [place] [address] [phone]  [facility]  [address] [place] [address] [phone]  [facility] [address]  [address] [place] [address] [phone]  [facility]  [address] [place], [place] [address] [phone]  [facility]  [address], [place] [address] [phone]  [facility]  [address] [place], [place] [address] [phone]  [facility]  [address] [place] [address] [phone]  [facility]  [address] [place] [address] [phone]  [facility]  [address] [place] [address] [phone]  [facility]  [address] [place] [address] [phone]  [facility]  [address] [place] [address] [phone]  [facility]  [address], [place] [address] [phone]  [facility]  [address] [place] [address] [phone]  [facility]  [place] [phone]  [facility] EFAX  [address] [place], [place] [phone]  [facility]  [address], [place] [address] [phone]  [facility]  [address], [place] [address] [phone]  [facility]  [address], [place] [address] [phone]  [facility]  [address], [place] [address] [phone]  [facility]  [address] [place] [address] [phone]  [facility]  [address] [place] [address] [phone]  [facility]patient [facility]  [place] [phone]  [facility]  [address],  [place] [address] [phone]  [facility] Patient Placement  [facility], [place] [phone]  [facility]  [address] [place] [address] [phone]    Situation ongoing,  CSW will follow up.    Maryjean Ka, MSW, [facility] 01/20/2023 8:34 PM

## 2023-01-20 NOTE — ED Notes (Signed)
PT coming from the ED, pt is catatonic wont respond to speech , but respond to pain stimuli.

## 2023-01-20 NOTE — ED Provider Notes (Signed)
[facility]MENT AT [facility] Provider Note   CSN: [id number] Arrival date & time: 01/20/23  1256     History  Chief Complaint  Patient presents with   Altered Mental Status    Tyrone Jenkins is a 42 y.o. male.  Tyrone Jenkins is a 42 y.o. male with a history of severe depression with psychotic features and catatonia, who presents via EMS after family found him standing in the hallway and not moving for 2 hours.  Family tried to get patient to sit down and eat and patient would not follow commands or answer any questions.  EMS reports that patient has not responded to their questions or spoken during transport.  Patient responds to touch, but will not respond to any questions or participate in history.  Chart review reveals that patient was admitted at [facility] last month for similar symptoms.  At that time he had a negative head CT and reassuring laboratory workup.  The history is provided by the patient, the EMS personnel and medical records.  Altered Mental Status      Home Medications Prior to Admission medications   Medication Sig Start Date End Date Taking? Authorizing Provider  acetaminophen (TYLENOL) 325 MG tablet Take 2 tablets (650 mg total) by mouth every 6 (six) hours as needed for mild pain or headache (or Fever >/= 101). 02/14/19   Zannie Cove, MD  isosorbide-hydrALAZINE (BIDIL) 20-37.5 MG tablet Take 0.5 tablets by mouth 2 (two) times daily. 04/04/19   Patwardhan, Anabel Bene, MD  metoprolol succinate (TOPROL-XL) 25 MG 24 hr tablet Take 2 tablets (50 mg total) by mouth daily. Take with or immediately following a meal. 03/26/19 06/24/19  Patwardhan, Anabel Bene, MD  Multiple Vitamin (MULTIVITAMIN WITH MINERALS) TABS tablet Take 1 tablet by mouth daily.    [provider]      Allergies    Haldol [haloperidol]    Review of Systems   Review of Systems  Unable to perform ROS: Psychiatric disorder    Physical Exam Updated Vital  Signs BP (!) 142/101 (BP Location: Right Arm)   Pulse (!) 101   Temp 98.5 F (36.9 C) (Oral)   Resp 16   Ht 6' (1.829 m)   Wt 96 kg   SpO2 96%   BMI 28.70 kg/m  Physical Exam Vitals and nursing note reviewed.  Constitutional:      General: He is not in acute distress.    Appearance: Normal appearance. He is well-developed. He is not diaphoretic.  HENT:     Head: Normocephalic and atraumatic.  Eyes:     General:        Right eye: No discharge.        Left eye: No discharge.  Cardiovascular:     Rate and Rhythm: Normal rate and regular rhythm.     Pulses: Normal pulses.     Heart sounds: Normal heart sounds.  Pulmonary:     Effort: Pulmonary effort is normal. No respiratory distress.     Breath sounds: Normal breath sounds. No wheezing or rales.     Comments: Respirations equal and unlabored, patient able to speak in full sentences, lungs clear to auscultation bilaterally  Abdominal:     General: Bowel sounds are normal. There is no distension.     Palpations: Abdomen is soft. There is no mass.     Tenderness: There is no abdominal tenderness. There is no guarding.     Comments: Abdomen soft, nondistended,  nontender to palpation in all quadrants without guarding or peritoneal signs  Musculoskeletal:        General: No deformity.     Cervical back: Neck supple.  Skin:    General: Skin is warm and dry.     Capillary Refill: Capillary refill takes less than 2 seconds.  Neurological:     Comments: Patient moving all extremities spontaneously, sitting still with eyes closed, will flinch when hand is placed in front of his face but will not open his eyes, answer questions or follow any commands.  Psychiatric:        Attention and Perception: He is inattentive.        Mood and Affect: Affect is flat.        Speech: He is noncommunicative.        Behavior: Behavior is withdrawn.     ED Results / Procedures / Treatments   Labs (all labs ordered are listed, but only abnormal  results are displayed) Labs Reviewed  COMPREHENSIVE METABOLIC PANEL  ETHANOL  RAPID URINE DRUG SCREEN, HOSP PERFORMED  CBC WITH DIFFERENTIAL/PLATELET  ACETAMINOPHEN LEVEL  SALICYLATE LEVEL    EKG EKG Interpretation Date/Time:  Thursday January 20 2023 13:56:53 EDT Ventricular Rate:  99 PR Interval:  134 QRS Duration:  80 QT Interval:  340 QTC Calculation: 436 R Axis:   60  Text Interpretation: Normal sinus rhythm Normal ECG When compared with ECG of 07-Jan-2014 17:38, PREVIOUS ECG IS PRESENT Confirmed by Gwyneth Sprout ([id number]) on 01/20/2023 2:03:18 PM  Radiology No results found.  Procedures Procedures    Medications Ordered in ED Medications - No data to display  ED Course/ Medical Decision Making/ A&P                                 Medical Decision Making Amount and/or Complexity of Data Reviewed Labs: ordered.   42 year old male with history of severe depression and catatonia presents with catatonic behavior, sitting in wheelchair with eyes closed will not move, respond or follow commands.  Does withdraw to painful stimuli and flinch when hand movement from the face.  Medical clearance labs collected and in process at shift change.  TTS consult placed.  Care signed out to PA Sherilyn Banker who will follow-up on pending lab work to ensure patient is medically cleared.        Final Clinical Impression(s) / ED Diagnoses Final diagnoses:  None    Rx / DC Orders ED Discharge Orders     None         Legrand Rams 01/20/23 1557    Gwyneth Sprout, MD 01/21/23 [phone]

## 2023-01-21 LAB — RAPID URINE DRUG SCREEN, HOSP PERFORMED
Amphetamines: NOT DETECTED
Barbiturates: NOT DETECTED
Benzodiazepines: NOT DETECTED
Cocaine: NOT DETECTED
Opiates: NOT DETECTED
Tetrahydrocannabinol: NOT DETECTED

## 2023-01-21 LAB — SARS CORONAVIRUS 2 BY RT PCR: SARS Coronavirus 2 by RT PCR: NEGATIVE

## 2023-01-21 LAB — CK: Total CK: 488 U/L — ABNORMAL HIGH (ref 49–397)

## 2023-01-21 LAB — CBG MONITORING, ED: Glucose-Capillary: 90 mg/dL (ref 70–99)

## 2023-01-21 MED ORDER — LORAZEPAM 2 MG/ML IJ SOLN
2.0000 mg | INTRAMUSCULAR | Status: AC
  Administered 2023-01-21: 2 mg via INTRAVENOUS
  Filled 2023-01-21: qty 1

## 2023-01-21 MED ORDER — LORAZEPAM 2 MG/ML IJ SOLN
2.0000 mg | Freq: Three times a day (TID) | INTRAMUSCULAR | Status: DC
  Administered 2023-01-21: 2 mg via INTRAVENOUS
  Filled 2023-01-21: qty 1

## 2023-01-21 NOTE — Progress Notes (Signed)
LCSW Progress Note  [id number]   AUDWIN SEMPER  01/21/2023  11:08 AM  Description:   Inpatient Psychiatric Referral  Patient was recommended inpatient per  Alona Bene, PMHNP There are no available beds at [facility], per [facility] Patient was referred to the following out of network facilities: Destination  Service Provider Address Phone Fax  [facility]  [address] [place], [place] [address] [phone]  [facility]  [address] [place] [address] [phone]  [facility] [address]  [address], [place] [address] [phone]  [facility]  [address] [place], [place] [address] [phone]  [facility]  [address], [place] [address] [phone]  [facility]  [address] [place], [place] [address] [phone]  [facility]  [address] [place] [address] [phone]  [facility]  [address] [place] [address] [phone]  [facility]  [address], [place] [address] [phone]  [facility]  [address], [place] [address] [phone]  [facility]  [address] [place] [address] [phone]  [facility]  [address] [place] [address] [phone]  [facility]  [address], [place] [address] [phone]  [facility]  [place] [phone]  [facility] EFAX  [address] [place], [place] [phone]  [facility]  [address] [place] [address] [phone]  [facility]  [address], [place] [address] [phone]  [facility]  [address], [place] [address] [phone]  [facility]  [address], [place] [address] [phone]  [facility]  [address] [place] [address] [phone]  [facility]  [address] [place] [address] [phone]  [facility]  [place] [phone]  [facility]  [address] [place] [address] [phone]  [facility] Patient Placement  [facility], [place] [phone]  [facility]  [address] [place] [address] [phone]      Situation ongoing, CSW to continue following and update chart as more information becomes available.      Cathie Beams, MSW, LCSW  01/21/2023 11:08 AM

## 2023-01-21 NOTE — Progress Notes (Addendum)
Inpatient Behavioral Health Placement  Addendum: Per Apple with Mikey Bussing pt is under review. Nursing to follow up and assist with intake questions. Nursing notified -Geofrey Borreros,RN.  Pt has been denied by [facility] per at this time per [facility] due to catatonia.  12:26am This CSW receievd a phone call from [facility] with Intake advising that pt is under review PENDING: UDS, [place], COVID, and IVC. CSW advised that pt is currently VOL. Intake informed that pt would need to be IVC due to cataonia. CSW will advise care team and assist with PENDING items to be faxed to [facility] at [phone].   Care Team notified:Geofrey Corky Mull Hendra,LCSW  Maryjean Ka, MSW, [facility] 01/21/2023 12:37 AM

## 2023-01-21 NOTE — ED Notes (Signed)
Patient remains catatonic at this time. Although will move arms slights and respond to pain.

## 2023-01-21 NOTE — ED Provider Notes (Signed)
Emergency Medicine Observation Re-evaluation Note  Tyrone Jenkins is a 42 y.o. male, seen on rounds today.  Pt initially presented to the ED for complaints of Altered Mental Status Currently, the patient is asleep. Comes in for catatonia. It appears that in the middle night, they had done bladder scan that revealed 800 cc of urine. In and out cath done.  Physical Exam  BP 136/68 (BP Location: Right Arm)   Pulse 89   Temp 97.7 F (36.5 C) (Oral)   Resp 18   Ht 6' (1.829 m)   Wt 96 kg   SpO2 96%   BMI 28.70 kg/m  Physical Exam General: No acute distress Cardiac: Regular rate Lungs: No respiratory distress Psych: Currently calm  ED Course / MDM  EKG:EKG Interpretation Date/Time:  Thursday January 20 2023 13:56:53 EDT Ventricular Rate:  99 PR Interval:  134 QRS Duration:  80 QT Interval:  340 QTC Calculation: 436 R Axis:   60  Text Interpretation: Normal sinus rhythm Normal ECG When compared with ECG of 07-Jan-2014 17:38, PREVIOUS ECG IS PRESENT Confirmed by Gwyneth Sprout ([id number]) on 01/20/2023 2:03:18 PM  I have reviewed the labs performed to date as well as medications administered while in observation.  Recent changes in the last 24 hours include -patient brought in for catatonia.  It appears that he is having some urinary retention.  We will continue to monitor urine output.  This request has been communicated directly with psychiatry nurse today.  Plan  Current plan is for psych assessment.    Derwood Kaplan, MD 01/21/23 [phone]

## 2023-01-21 NOTE — Progress Notes (Signed)
01/21/2023  1827  When speaking to patient before drawing labs patient was able to blink eye a few times but never opened his eyes. When touching patient arm he gently laid his arm by his side.

## 2023-01-21 NOTE — Progress Notes (Cosign Needed Addendum)
[facility] Psych ED Progress Note  01/21/2023 4:15 PM Tyrone Jenkins  MRN:  [id number]    Subjective: Patient seen and reassessed today. Patient continues to be nonverbal throughout psychiatric evaluation. Patient continues to be unable to participate in assessment and remains nonverbal. Patient is laying in bed on his back, eyes closed. His appearance is appropriate for environment. Her eye contact is good. Patient has been given 2 total doses of Ativan 1 mg IV since admission, and patient has not made much improvement.   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, Bizarre behavior, Non-traumatic rhabdomyolysis   [place] Scale:  Flowsheet Row ED from [facility]20/2023 in [facility] Department at [facility]  C-SSRS RISK CATEGORY No Risk       Past Medical History:  Past Medical History:  Diagnosis Date   Catatonia    Cellulitis 01/2019   both wrist   Schizophrenia [facility])     Past Surgical History:  Procedure Laterality Date   INCISION AND DRAINAGE OF WOUND Left 02/09/2019   Procedure: debridement of left wrist wound with Acell placement;  Surgeon: Allena Napoleon, MD;  Location: [facility] OR;  Service: Plastics;  Laterality: Left;  1 hour   Family History: History reviewed. No pertinent family history.  Social History:  Social History   Substance and Sexual Activity  Alcohol Use Yes   Comment: social     Social History   Substance and Sexual Activity  Drug Use Never    Social History   Socioeconomic History   Marital status: Single    Spouse name: Not on file   Number of children: 2   Years of education: Not on file   Highest education  level: Not on file  Occupational History   Not on file  Tobacco Use   Smoking status: Former   Smokeless tobacco: Never  Vaping Use   Vaping status: Never Used  Substance and Sexual Activity   Alcohol use: Yes    Comment: social   Drug use: Never   Sexual activity: Not on file  Other Topics Concern   Not on file  Social History Narrative   ** Merged History Encounter **       Social Determinants of Health   Financial Resource Strain: Not on file  Food Insecurity: Patient Unable To Answer (07/16/2022)   Received from [facility]   Hunger Vital Sign    Worried About Running Out of Food in the Last Year: Patient unable to answer    Ran Out of Food in the Last Year: Patient unable to answer  Transportation Needs: Patient Unable To Answer (07/16/2022)   [facility]    Lack of Transportation (Medical): Patient unable to answer    Lack of Transportation (Non-Medical): Patient unable to answer  Physical Activity: Not on file  Stress: Not on file  Social Connections: Unknown (08/04/2021)   Received from [facility]   Social Network    Social Network: Not on file    Sleep: Poor  Appetite:  Poor  Current Medications: Current Facility-Administered Medications  Medication Dose Route Frequency Provider Last Rate Last Admin   ARIPiprazole (ABILIFY) tablet 10 mg  10 mg Oral  QHS Arabella Merles, PA-C       LORazepam (ATIVAN) injection 2 mg  2 mg Intravenous TID Motley-Mangrum, Marquasha Brutus A, PMHNP   2 mg at 01/21/23 1443   OLANZapine zydis (ZYPREXA) disintegrating tablet 5 mg  5 mg Oral Q8H PRN Motley-Mangrum, Glennys Schorsch A, PMHNP       And   LORazepam (ATIVAN) tablet 1 mg  1 mg Oral PRN Motley-Mangrum, Liahna Brickner A, PMHNP       And   ziprasidone (GEODON) injection 20 mg  20 mg Intramuscular PRN Motley-Mangrum, Matthieu Loftus A, PMHNP       mirtazapine (REMERON) tablet 7.5 mg  7.5 mg Oral QHS Arabella Merles, PA-C       Current Outpatient Medications   Medication Sig Dispense Refill   ARIPiprazole (ABILIFY) 10 MG tablet Take 10 mg by mouth at bedtime.     clonazePAM (KLONOPIN) 1 MG tablet Take 1 mg by mouth 2 (two) times daily.     mirtazapine (REMERON) 7.5 MG tablet Take 7.5 mg by mouth at bedtime.     ZYRTEC ALLERGY 10 MG tablet Take 10 mg by mouth daily as needed for allergies or rhinitis.     acetaminophen (TYLENOL) 325 MG tablet Take 2 tablets (650 mg total) by mouth every 6 (six) hours as needed for mild pain or headache (or Fever >/= 101).     isosorbide-hydrALAZINE (BIDIL) 20-37.5 MG tablet Take 0.5 tablets by mouth 2 (two) times daily. (Patient not taking: Reported on 01/20/2023) 180 tablet 1   metoprolol succinate (TOPROL-XL) 25 MG 24 hr tablet Take 2 tablets (50 mg total) by mouth daily. Take with or immediately following a meal. (Patient not taking: Reported on 01/20/2023) 30 tablet 3    Lab Results:  Results for orders placed or performed during the hospital encounter of 01/20/23 (from the past 48 hour(s))  Comprehensive metabolic panel     Status: Abnormal   Collection Time: 01/20/23  2:21 PM  Result Value Ref Range   Sodium 138 135 - 145 mmol/L   Potassium 4.0 3.5 - 5.1 mmol/L   Chloride 101 98 - 111 mmol/L   CO2 23 22 - 32 mmol/L   Glucose, Bld 107 (H) 70 - 99 mg/dL    Comment: Glucose reference range applies only to samples taken after fasting for at least 8 hours.   BUN 39 (H) 6 - 20 mg/dL   Creatinine, Ser 8.29 0.61 - 1.24 mg/dL   Calcium 9.4 8.9 - 56.2 mg/dL   Total Protein 8.2 (H) 6.5 - 8.1 g/dL   Albumin 5.2 (H) 3.5 - 5.0 g/dL   AST 39 15 - 41 U/L   ALT 22 0 - 44 U/L   Alkaline Phosphatase 55 38 - 126 U/L   Total Bilirubin 4.7 (H) 0.3 - 1.2 mg/dL   GFR, Estimated >13 >08 mL/min    Comment: (NOTE) Calculated using the CKD-EPI Creatinine Equation (2021)    Anion gap 14 5 - 15    Comment: Performed at [facility], [address] [place], [place] [address]  Ethanol     Status: None    Collection Time: 01/20/23  2:21 PM  Result Value Ref Range   Alcohol, Ethyl (B) <10 <10 mg/dL    Comment: (NOTE) Lowest detectable limit for serum alcohol is 10 mg/dL.  For medical purposes only. Performed at [facility], [address], [place], [place] [address]   CBC with Diff     Status: Abnormal  Collection Time: 01/20/23  2:21 PM  Result Value Ref Range   WBC 10.5 4.0 - 10.5 K/uL   RBC 5.51 4.22 - 5.81 MIL/uL   Hemoglobin 16.6 13.0 - 17.0 g/dL   HCT 54.0 98.1 - 19.1 %   MCV 91.8 80.0 - 100.0 fL   MCH 30.1 26.0 - 34.0 pg   MCHC 32.8 30.0 - 36.0 g/dL   RDW 47.8 29.5 - 62.1 %   Platelets 329 150 - 400 K/uL   nRBC 0.0 0.0 - 0.2 %   Neutrophils Relative % 66 %   Neutro Abs 6.9 1.7 - 7.7 K/uL   Lymphocytes Relative 22 %   Lymphs Abs 2.3 0.7 - 4.0 K/uL   Monocytes Relative 11 %   Monocytes Absolute 1.2 (H) 0.1 - 1.0 K/uL   Eosinophils Relative 0 %   Eosinophils Absolute 0.0 0.0 - 0.5 K/uL   Basophils Relative 1 %   Basophils Absolute 0.1 0.0 - 0.1 K/uL   Immature Granulocytes 0 %   Abs Immature Granulocytes 0.03 0.00 - 0.07 K/uL    Comment: Performed at [facility], [address], [place], [place] [address]  Acetaminophen level     Status: Abnormal   Collection Time: 01/20/23  2:21 PM  Result Value Ref Range   Acetaminophen (Tylenol), Serum <10 (L) 10 - 30 ug/mL    Comment: (NOTE) Therapeutic concentrations vary significantly. A range of 10-30 ug/mL  may be an effective concentration for many patients. However, some  are best treated at concentrations outside of this range. Acetaminophen concentrations >150 ug/mL at 4 hours after ingestion  and >50 ug/mL at 12 hours after ingestion are often associated with  toxic reactions.  Performed at [facility], [address], [place], [place] [address]   Salicylate level     Status: Abnormal   Collection Time: 01/20/23  2:21 PM  Result Value Ref Range    Salicylate Lvl <7.0 (L) 7.0 - 30.0 mg/dL    Comment: Performed at [facility], [address], [place], [place] [address]  CBG monitoring, ED     Status: None   Collection Time: 01/20/23 10:38 PM  Result Value Ref Range   Glucose-Capillary 96 70 - 99 mg/dL    Comment: Glucose reference range applies only to samples taken after fasting for at least 8 hours.  Urine rapid drug screen (hosp performed)     Status: None   Collection Time: 01/21/23  3:00 AM  Result Value Ref Range   Opiates NONE DETECTED NONE DETECTED   Cocaine NONE DETECTED NONE DETECTED   Benzodiazepines NONE DETECTED NONE DETECTED   Amphetamines NONE DETECTED NONE DETECTED   Tetrahydrocannabinol NONE DETECTED NONE DETECTED   Barbiturates NONE DETECTED NONE DETECTED    Comment: (NOTE) DRUG SCREEN FOR MEDICAL PURPOSES ONLY.  IF CONFIRMATION IS NEEDED FOR ANY PURPOSE, NOTIFY LAB WITHIN 5 DAYS.  LOWEST DETECTABLE LIMITS FOR URINE DRUG SCREEN Drug Class                     Cutoff (ng/mL) Amphetamine and metabolites    1000 Barbiturate and metabolites    200 Benzodiazepine                 200 Opiates and metabolites        300 Cocaine and metabolites        300 THC  50 Performed at [facility], [address], [place], [place] [address]     Blood Alcohol level:  Lab Results  Component Value Date   [facility] <10 01/20/2023   ETH <10 01/27/2019    Physical Findings:  CIWA:    COWS:     Musculoskeletal:  Patient observed resting in bed.  Psychiatric Specialty Exam:  Presentation  General Appearance:  Neat  Eye Contact: None  Speech: Blocked  Speech Volume: -- (patient appears to continue to be in a catatonic state)  Handedness: -- (patient apeared to be catatonic)   Mood and Affect  Mood: Euthymic (patient appears to continue to be in a catatonic state)  Affect: Other (comment) (patient appears to continue to be in a catatonic  state)   Thought Process  Thought Processes: Other (comment) (patient appears to continue to be in a catatonic state)  Descriptions of Associations:-- (patient appears to continue to be in a catatonic state)  Orientation:No data recorded Thought Content:Other (comment) (patient appears to continue to be in a catatonic state)  History of Schizophrenia/Schizoaffective disorder:No data recorded Duration of Psychotic Symptoms:No data recorded Hallucinations:Hallucinations: -- (patient appears to continue to be in a catatonic state)  Ideas of Reference:None (unable to assess, patient apeared to be catatonic)  Suicidal Thoughts:Suicidal Thoughts: -- (patient appears to continue to be in a catatonic state)  Homicidal Thoughts:Homicidal Thoughts: -- (patient appears to continue to be in a catatonic state)   Sensorium  Memory: Other (comment) (patient appears to continue to be in a catatonic state)  Judgment: Other (comment) (patient appears to continue to be in a catatonic state)  Insight: Other (comment) (patient appears to continue to be in a catatonic state)   Executive Functions  Concentration: Other (comment) (patient appears to continue to be in a catatonic state)  Attention Span: Other (comment) (patient appears to continue to be in a catatonic state)  Recall: Other (comment) (patient appears to continue to be in a catatonic state)  Fund of Knowledge: Other (comment)  Language: Other (comment) (patient appears to continue to be in a catatonic state)   Psychomotor Activity  Psychomotor Activity: Psychomotor Activity: Decreased; Psychomotor Retardation   Assets  Assets: Communication Skills; Social Support; Desire for Improvement   Sleep  Sleep: Sleep: -- (unable to assess, patient apeared to be catatonic) Number of Hours of Sleep: 0 (patient appears to continue to be in a catatonic state)    Physical Exam: Physical Exam Vitals and nursing note  reviewed. Exam conducted with a chaperone present.  Neurological:     Mental Status: He is alert.  Psychiatric:     Comments: Patient appears to be in a catatonic state.     ROS Blood pressure 121/81, pulse 92, temperature 98 F (36.7 C), temperature source Oral, resp. rate 18, height 6' (1.829 m), weight 96 kg, SpO2 95%. Body mass index is 28.7 kg/m.   Medical Decision Making: Continue to recommend inpatient psychiatric admission, once patient catatonia has improved. Increased patient Ativan from 1 mg IV TID to 2 mg TID, due to catatonia.  @ 1727 Will give Ativan 2 mg IV Once. Will check patient CK, patient has a hx of Non-traumatic rhabdomyolysis   Mitsuru Dault MOTLEY-MANGRUM, PMHNP 01/21/2023, 4:15 PM

## 2023-01-21 NOTE — ED Notes (Signed)
This Rn was able to spoke with apple from [place] facility, still waiting for approval.

## 2023-01-21 NOTE — Progress Notes (Signed)
Patient has woke up and nodded appropriately to questions. He has drank multiple cups of different fluids (sprite, water, orange juice). He has not urinated this shift. Bladder scanned patient and 576 ml shown. Notified Dr. Particia Nearing. Will encourage fluids but if unable to urinate will I&O cath patient per order. Will continue to monitor.   2305 - With male NT, patient stood and urinated after I removed condom cath. Will continue to monitor urine output.

## 2023-01-21 NOTE — ED Notes (Signed)
This RN did bladder scan the pt and 821 of urine is retaining on the bladder, straight cath done and able to drain 800 ml of urine

## 2023-01-22 MED ORDER — LORAZEPAM 2 MG/ML IJ SOLN
2.0000 mg | Freq: Two times a day (BID) | INTRAMUSCULAR | Status: DC
  Administered 2023-01-22 – 2023-01-23 (×4): 2 mg via INTRAVENOUS
  Filled 2023-01-22 (×4): qty 1

## 2023-01-22 NOTE — Progress Notes (Signed)
01/22/2023  1330  Called Sheriff [phone] To add patient to list to be transported to [facility].

## 2023-01-22 NOTE — Progress Notes (Signed)
Inpatient Behavioral Health Placement  This CSW spoke with [facility] Intake Ms. Jerolyn Center who informed that provider is wiling to accept pt within the voluntary status PENDING Negative COVID. Intake requested that the negative COVID be sent via secure email:  [email or web address]   This CSW sent the negative COVID results via EPIC and secure email. 1st shift CSW to follow up with [facility] Intake.  Maryjean Ka, MSW, LCSWA 01/22/2023 1:20 AM

## 2023-01-22 NOTE — ED Notes (Signed)
Pt woke up and went to the bathroom independently.

## 2023-01-22 NOTE — ED Notes (Signed)
Per Shanda Bumps, RN at [facility], patient has been accepted to this facility. She also stated that patient needed to be IVC'd for transport d/t to patient still being in a catatonic state. Accepting physician is Dr. Newton Pigg. Notified Dr. Eudelia Bunch to complete IVC for patient.

## 2023-01-22 NOTE — Progress Notes (Signed)
[facility] ED Progress Note  01/22/2023 4:13 PM Tyrone Jenkins  MRN:  [id number]   Subjective: Patient seen and reassessed today. Patient continues to be nonverbal throughout psychiatric evaluation. Patient did grimace when this provider turned on the light and called his name. He did not speak or open his eyes. Patient also stretched during this assessment and wiped his mouth.  Patient is laying in bed on his back, eyes closed. His appearance is appropriate for environment.  Patient will be transported to [facility] on 01/23/2023.  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, Bizarre behavior, Non-traumatic rhabdomyolysis   [place] Scale:  Flowsheet Row ED from 01/20/2023 in [facility] Emergency Department at [facility]  C-SSRS RISK CATEGORY No Risk       Past Medical History:  Past Medical History:  Diagnosis Date   Catatonia    Cellulitis 01/2019   both wrist   Schizophrenia [facility])     Past Surgical History:  Procedure Laterality Date   INCISION AND DRAINAGE OF WOUND Left 02/09/2019   Procedure: debridement of left wrist wound with Acell placement;  Surgeon: Allena Napoleon, MD;  Location: [facility] OR;  Service: Plastics;  Laterality: Left;  1 hour   Family History: History reviewed. No pertinent family history.  Social History:  Social History   Substance and Sexual Activity  Alcohol Use Yes   Comment: social     Social History   Substance and Sexual Activity  Drug Use Never    Social History   Socioeconomic History   Marital status: Single    Spouse name: Not on file   Number of children: 2   Years of education: Not on  file   Highest education level: Not on file  Occupational History   Not on file  Tobacco Use   Smoking status: Former   Smokeless tobacco: Never  Vaping Use   Vaping status: Never Used  Substance and Sexual Activity   Alcohol use: Yes    Comment: social   Drug use: Never   Sexual activity: Not on file  Other Topics Concern   Not on file  Social History Narrative   ** Merged History Encounter **       Social Determinants of Health   Financial Resource Strain: Not on file  Food Insecurity: Patient Unable To Answer (07/16/2022)   Received from [facility]   Hunger Vital Sign    Worried About Running Out of Food in the Last Year: Patient unable to answer    Ran Out of Food in the Last Year: Patient unable to answer  Transportation Needs: Patient Unable To Answer (07/16/2022)   Received from [facility] - Transportation    Lack of Transportation (Medical): Patient unable to answer    Lack of Transportation (Non-Medical): Patient unable to answer  Physical Activity: Not on file  Stress: Not on file  Social Connections: Unknown (08/04/2021)   Received from [facility]   Social Network    Social Network: Not on file    Sleep: Fair  Appetite:  Poor  Current Medications: Current Facility-Administered Medications  Medication Dose Route Frequency Provider Last Rate Last Admin   ARIPiprazole (ABILIFY)  tablet 10 mg  10 mg Oral QHS Arabella Merles, PA-C   10 mg at 01/21/23 2157   LORazepam (ATIVAN) injection 2 mg  2 mg Intravenous BID Motley-Mangrum, Deolinda Frid A, PMHNP   2 mg at 01/22/23 1100   mirtazapine (REMERON) tablet 7.5 mg  7.5 mg Oral QHS Arabella Merles, PA-C   7.5 mg at 01/21/23 2157   OLANZapine zydis (ZYPREXA) disintegrating tablet 5 mg  5 mg Oral Q8H PRN Motley-Mangrum, Imran Nuon A, PMHNP       And   ziprasidone (GEODON) injection 20 mg  20 mg Intramuscular PRN Motley-Mangrum, Ezra Sites, PMHNP       Current Outpatient Medications  Medication Sig  Dispense Refill   ARIPiprazole (ABILIFY) 10 MG tablet Take 10 mg by mouth at bedtime.     clonazePAM (KLONOPIN) 1 MG tablet Take 1 mg by mouth 2 (two) times daily.     mirtazapine (REMERON) 7.5 MG tablet Take 7.5 mg by mouth at bedtime.     ZYRTEC ALLERGY 10 MG tablet Take 10 mg by mouth daily as needed for allergies or rhinitis.     acetaminophen (TYLENOL) 325 MG tablet Take 2 tablets (650 mg total) by mouth every 6 (six) hours as needed for mild pain or headache (or Fever >/= 101).     isosorbide-hydrALAZINE (BIDIL) 20-37.5 MG tablet Take 0.5 tablets by mouth 2 (two) times daily. (Patient not taking: Reported on 01/20/2023) 180 tablet 1   metoprolol succinate (TOPROL-XL) 25 MG 24 hr tablet Take 2 tablets (50 mg total) by mouth daily. Take with or immediately following a meal. (Patient not taking: Reported on 01/20/2023) 30 tablet 3    Lab Results:  Results for orders placed or performed during the hospital encounter of 01/20/23 (from the past 48 hour(s))  CBG monitoring, ED     Status: None   Collection Time: 01/20/23 10:38 PM  Result Value Ref Range   Glucose-Capillary 96 70 - 99 mg/dL    Comment: Glucose reference range applies only to samples taken after fasting for at least 8 hours.  Urine rapid drug screen (hosp performed)     Status: None   Collection Time: 01/21/23  3:00 AM  Result Value Ref Range   Opiates NONE DETECTED NONE DETECTED   Cocaine NONE DETECTED NONE DETECTED   Benzodiazepines NONE DETECTED NONE DETECTED   Amphetamines NONE DETECTED NONE DETECTED   Tetrahydrocannabinol NONE DETECTED NONE DETECTED   Barbiturates NONE DETECTED NONE DETECTED    Comment: (NOTE) DRUG SCREEN FOR MEDICAL PURPOSES ONLY.  IF CONFIRMATION IS NEEDED FOR ANY PURPOSE, NOTIFY LAB WITHIN 5 DAYS.  LOWEST DETECTABLE LIMITS FOR URINE DRUG SCREEN Drug Class                     Cutoff (ng/mL) Amphetamine and metabolites    1000 Barbiturate and metabolites    200 Benzodiazepine                  200 Opiates and metabolites        300 Cocaine and metabolites        300 THC                            50 Performed at [facility], [address] [place], [place] [address]   CK     Status: Abnormal   Collection Time: 01/21/23  6:00 PM  Result Value Ref Range  Total CK 488 (H) 49 - 397 U/L    Comment: Performed at [facility], [address] [place], [place] [address]  SARS Coronavirus 2 by RT PCR (hospital order, performed in [facility] hospital lab) *cepheid single result test* Anterior Nasal Swab     Status: None   Collection Time: 01/21/23  8:58 PM   Specimen: Anterior Nasal Swab  Result Value Ref Range   SARS Coronavirus 2 by RT PCR NEGATIVE NEGATIVE    Comment: (NOTE) SARS-CoV-2 target nucleic acids are NOT DETECTED.  The SARS-CoV-2 RNA is generally detectable in upper and lower respiratory specimens during the acute phase of infection. The lowest concentration of SARS-CoV-2 viral copies this assay can detect is 250 copies / mL. A negative result does not preclude SARS-CoV-2 infection and should not be used as the sole basis for treatment or other patient management decisions.  A negative result may occur with improper specimen collection / handling, submission of specimen other than nasopharyngeal swab, presence of viral mutation(s) within the areas targeted by this assay, and inadequate number of viral copies (<250 copies / mL). A negative result must be combined with clinical observations, patient history, and epidemiological information.  Fact Sheet for Patients:   RoadLapTop.co.za  Fact Sheet for Healthcare Providers: http://kim-miller.com/  This test is not yet approved or  cleared by the [place] FDA and has been authorized for detection and/or diagnosis of SARS-CoV-2 by FDA under an Emergency Use Authorization (EUA).  This EUA will remain in effect (meaning this test  can be used) for the duration of the COVID-19 declaration under Section 564(b)(1) of the Act, 21 U.S.C. section 360bbb-3(b)(1), unless the authorization is terminated or revoked sooner.  Performed at [facility], [address], [place], [place] [address]   CBG monitoring, ED     Status: None   Collection Time: 01/21/23  9:53 PM  Result Value Ref Range   Glucose-Capillary 90 70 - 99 mg/dL    Comment: Glucose reference range applies only to samples taken after fasting for at least 8 hours.    Blood Alcohol level:  Lab Results  Component Value Date   [facility] <10 01/20/2023   ETH <10 01/27/2019    Physical Findings:  CIWA:    COWS:     Musculoskeletal:  Patient observed resting in his room.   Psychiatric Specialty Exam:  Presentation  General Appearance:  Fairly Groomed  Eye Contact: None  Speech: Blocked  Speech Volume: Other (comment) (patient would not speak)  Handedness: -- (patient apeared to be catatonic)   Mood and Affect  Mood: -- (patient would not engage)  Affect: Appropriate   Thought Process  Thought Processes: Other (comment) (patient would not engage)  Descriptions of Associations:-- (patient would not engage)  Orientation:Other (comment) (patient would not engage)  Thought Content:Other (comment) (patient would not engage)  History of Schizophrenia/Schizoaffective disorder:No data recorded Duration of Psychotic Symptoms:No data recorded Hallucinations:Hallucinations: Other (comment) (patient would not engage)  Ideas of Reference:Other (comment) (patient would not engage)  Suicidal Thoughts:Suicidal Thoughts: -- (patient would not engage)  Homicidal Thoughts:Homicidal Thoughts: -- (patient would not engage)   Sensorium  Memory: Other (comment) (patient would not engage)  Judgment: Other (comment) (patient would not engage)  Insight: Other (comment) (patient would not engage)   Executive Functions   Concentration: Other (comment) (patient would not engage)  Attention Span: Other (comment) (patient would not engage)  Recall: Other (comment) (patient would not engage)  Fund of  Knowledge: Other (comment) (patient would not engage)  Language: Other (comment) (patient would not engage)   Psychomotor Activity  Psychomotor Activity: Psychomotor Activity: Normal   Assets  Assets: Social Support; Housing; Physical Health   Sleep  Sleep: Sleep: Fair Number of Hours of Sleep: 0 (patient appears to continue to be in a catatonic state)    Physical Exam: Physical Exam Vitals and nursing note reviewed. Exam conducted with a chaperone present.  Neurological:     Mental Status: He is alert.  Psychiatric:        Attention and Perception: He is inattentive.        Mood and Affect: Mood normal.        Speech: He is noncommunicative.        Behavior: Behavior is uncooperative.     Comments: Unable to assess thought content or judgement due to catatonia     Review of Systems  Psychiatric/Behavioral:         Unable to perform    Blood pressure 109/68, pulse 68, temperature 98.6 F (37 C), temperature source Axillary, resp. rate 18, height 6' (1.829 m), weight 96 kg, SpO2 99%. Body mass index is 28.7 kg/m.   Medical Decision Making: Patient case review and discussed with Dr. Jannifer Franklin. Patient needs inpatient psychiatric admission for stabilization and treatment. Patient was nonverbal throughout psychiatric evaluation. Due to patient's change in his behavior, responsiveness, and psychiatric presentation catatonia screening was performed in which patient rated at 9 (on 01/20/23) on catatonia Screening instrument (BFCS). This improved after several doses of ativan, down to a 7.  Patient will be transported to [facility] on 01/23/2023.  Audine Mangione MOTLEY-MANGRUM, PMHNP 01/22/2023, 4:13 PM

## 2023-01-22 NOTE — Progress Notes (Signed)
01/22/2023  1455 Called [facility] [phone] Spoke with Helmut Muster to let her know that patient would not be transported until tomorrow by [facility]. Per Helmut Muster that would be fine just call back tomorrow to give update.

## 2023-01-22 NOTE — Progress Notes (Signed)
01/22/2023  1502  Called back to the [facility] to update the discharge location is now [facility].

## 2023-01-23 DIAGNOSIS — F061 Catatonic disorder due to known physiological condition: Secondary | ICD-10-CM

## 2023-01-23 NOTE — ED Notes (Addendum)
Parents at bedside visiting patient.

## 2023-01-23 NOTE — ED Notes (Signed)
Patient ambulatory to and from restroom without deficit. Patient compliant with medications. Patient nods head "yes/no" patient remains non verbal, but compliant and able to follow commands. Resting in bed. No needs at this time.

## 2023-01-23 NOTE — ED Notes (Signed)
Per report from [facility], patient is unable to be transported via [facility] until tomorrow. Stated that facility stated that they are unable to hold bed for patient.

## 2023-01-23 NOTE — Progress Notes (Signed)
[facility] Psych ED Progress Note  01/23/2023 6:20 PM Tyrone Jenkins  MRN:  [id number]   Subjective:   Principal Problem: Catatonia Diagnosis:  Principal Problem:   Catatonia Active Problems:   Schizophrenia, unspecified [facility])   ED Assessment Time Calculation: Start Time: 1000 Stop Time: 1015 Total Time in Minutes (Assessment Completion): 15   Past Psychiatric History: Patient seen and reassessed today. Patient continues to be nonverbal throughout psychiatric evaluation. Today patient, was moving in bed, wiping his face, and holding arm up, while RN gave his medications in his IV. He did not speak or open his eyes. Patient opened his eyes during this assessment. His appearance is appropriate for environment.  Patient will be transported to [facility] on 01/24/2023.  Per RN note during the night patient woke up and went to the bathroom independently, "This RN asked Pt if he was ok and needing anything and Pt actually responded to this RN by asking "What time is it"? This RN made him aware that it was 0142 in the morning and he replied back saying "It's 1:42", and this RN replied "yes" and he rolled over to his left side to go back to sleep."  [place] Scale:  Flowsheet Row ED from 01/20/2023 in [facility] Emergency Department at [facility]  C-SSRS RISK CATEGORY No Risk       Past Medical History:  Past Medical History:  Diagnosis Date   Catatonia    Cellulitis 01/2019   both wrist   Schizophrenia [facility])     Past Surgical History:  Procedure Laterality Date   INCISION AND DRAINAGE OF WOUND Left 02/09/2019   Procedure: debridement of left wrist wound with Acell placement;  Surgeon: Allena Napoleon, MD;  Location: [facility] OR;  Service: Plastics;  Laterality: Left;  1 hour   Family History: History reviewed. No pertinent family history.   Social History:  Social History   Substance and Sexual Activity  Alcohol Use Yes   Comment: social     Social History    Substance and Sexual Activity  Drug Use Never    Social History   Socioeconomic History   Marital status: Single    Spouse name: Not on file   Number of children: 2   Years of education: Not on file   Highest education level: Not on file  Occupational History   Not on file  Tobacco Use   Smoking status: Former   Smokeless tobacco: Never  Vaping Use   Vaping status: Never Used  Substance and Sexual Activity   Alcohol use: Yes    Comment: social   Drug use: Never   Sexual activity: Not on file  Other Topics Concern   Not on file  Social History Narrative   ** Merged History Encounter **       Social Determinants of Health   Financial Resource Strain: Not on file  Food Insecurity: Patient Unable To Answer (07/16/2022)   Received from [facility]   Hunger Vital Sign    Worried About Running Out of Food in the Last Year: Patient unable to answer    Ran Out of Food in the Last Year: Patient unable to answer  Transportation Needs: Patient Unable To Answer (07/16/2022)   Received from [facility] - Transportation    Lack of Transportation (Medical): Patient unable to answer    Lack of Transportation (Non-Medical): Patient unable to answer  Physical Activity: Not on file  Stress: Not on file  Social Connections: Unknown (08/04/2021)   Received from [facility]   Social Network    Social Network: Not on file    Sleep: Fair  Appetite:  Fair  Current Medications: Current Facility-Administered Medications  Medication Dose Route Frequency Provider Last Rate Last Admin   ARIPiprazole (ABILIFY) tablet 10 mg  10 mg Oral QHS Arabella Merles, PA-C   10 mg at 01/22/23 2129   LORazepam (ATIVAN) injection 2 mg  2 mg Intravenous BID Jenkins, Orlean Holtrop A, PMHNP   2 mg at 01/23/23 1017   mirtazapine (REMERON) tablet 7.5 mg  7.5 mg Oral QHS Arabella Merles, PA-C   7.5 mg at 01/22/23 2129   OLANZapine zydis (ZYPREXA) disintegrating tablet 5 mg  5 mg Oral Q8H  PRN Jenkins, Azarian Starace A, PMHNP       And   ziprasidone (GEODON) injection 20 mg  20 mg Intramuscular PRN Jenkins, Shena Vinluan A, PMHNP       Current Outpatient Medications  Medication Sig Dispense Refill   ARIPiprazole (ABILIFY) 10 MG tablet Take 10 mg by mouth at bedtime.     clonazePAM (KLONOPIN) 1 MG tablet Take 1 mg by mouth 2 (two) times daily.     mirtazapine (REMERON) 7.5 MG tablet Take 7.5 mg by mouth at bedtime.     ZYRTEC ALLERGY 10 MG tablet Take 10 mg by mouth daily as needed for allergies or rhinitis.     acetaminophen (TYLENOL) 325 MG tablet Take 2 tablets (650 mg total) by mouth every 6 (six) hours as needed for mild pain or headache (or Fever >/= 101).     isosorbide-hydrALAZINE (BIDIL) 20-37.5 MG tablet Take 0.5 tablets by mouth 2 (two) times daily. (Patient not taking: Reported on 01/20/2023) 180 tablet 1   metoprolol succinate (TOPROL-XL) 25 MG 24 hr tablet Take 2 tablets (50 mg total) by mouth daily. Take with or immediately following a meal. (Patient not taking: Reported on 01/20/2023) 30 tablet 3    Lab Results:  Results for orders placed or performed during the hospital encounter of 01/20/23 (from the past 48 hour(s))  SARS Coronavirus 2 by RT PCR (hospital order, performed in [facility] hospital lab) *cepheid single result test* Anterior Nasal Swab     Status: None   Collection Time: 01/21/23  8:58 PM   Specimen: Anterior Nasal Swab  Result Value Ref Range   SARS Coronavirus 2 by RT PCR NEGATIVE NEGATIVE    Comment: (NOTE) SARS-CoV-2 target nucleic acids are NOT DETECTED.  The SARS-CoV-2 RNA is generally detectable in upper and lower respiratory specimens during the acute phase of infection. The lowest concentration of SARS-CoV-2 viral copies this assay can detect is 250 copies / mL. A negative result does not preclude SARS-CoV-2 infection and should not be used as the sole basis for treatment or other patient management decisions.  A negative result may  occur with improper specimen collection / handling, submission of specimen other than nasopharyngeal swab, presence of viral mutation(s) within the areas targeted by this assay, and inadequate number of viral copies (<250 copies / mL). A negative result must be combined with clinical observations, patient history, and epidemiological information.  Fact Sheet for Patients:   RoadLapTop.co.za  Fact Sheet for Healthcare Providers: http://kim-miller.com/  This test is not yet approved or  cleared by the [place] FDA and has been authorized for detection and/or diagnosis of SARS-CoV-2 by FDA under an Emergency Use Authorization (EUA).  This EUA will remain in effect (meaning this test can be  used) for the duration of the COVID-19 declaration under Section 564(b)(1) of the Act, 21 U.S.C. section 360bbb-3(b)(1), unless the authorization is terminated or revoked sooner.  Performed at [facility], [address] [place], [place] [address]   CBG monitoring, ED     Status: None   Collection Time: 01/21/23  9:53 PM  Result Value Ref Range   Glucose-Capillary 90 70 - 99 mg/dL    Comment: Glucose reference range applies only to samples taken after fasting for at least 8 hours.    Blood Alcohol level:  Lab Results  Component Value Date   ETH <10 01/20/2023   ETH <10 01/27/2019    Physical Findings:  CIWA:    COWS:     Musculoskeletal: Strength & Muscle Tone: within normal limits Gait & Station: normal Patient leans: N/A  Psychiatric Specialty Exam:  Presentation  General Appearance:  Neat  Eye Contact: Minimal  Speech: Blocked  Speech Volume: Other (comment) (patient did not speak)  Handedness: -- (patient apeared to be catatonic)   Mood and Affect  Mood: Euthymic  Affect: Appropriate   Thought Process  Thought Processes: Other (comment) (patient did not engage)  Descriptions of  Associations:-- (patient did not engage)  Orientation:Other (comment)  Thought Content:Other (comment) (patient did not engage)  History of Schizophrenia/Schizoaffective disorder:No data recorded Duration of Psychotic Symptoms:No data recorded Hallucinations:Hallucinations: Other (comment) (patient did not engage)  Ideas of Reference:Other (comment)  Suicidal Thoughts:Suicidal Thoughts: -- (patient did not engage)  Homicidal Thoughts:Homicidal Thoughts: -- (patient would not engage)   Sensorium  Memory: Other (comment) (patient did not engage)  Judgment: Other (comment)  Insight: Other (comment) (patient did not engage)   Executive Functions  Concentration: Poor  Attention Span: Poor  Recall: Other (comment) (patient did not engage)  Fund of Knowledge: Other (comment)  Language: Other (comment) (patient did not engage)   Psychomotor Activity  Psychomotor Activity: Psychomotor Activity: Normal; Decreased   Assets  Assets: Social Support; Health and safety inspector; Housing   Sleep  Sleep: Sleep: Fair    Physical Exam: Physical Exam Vitals and nursing note reviewed. Exam conducted with a chaperone present.  Neurological:     Mental Status: He is alert.  Psychiatric:        Attention and Perception: Attention normal.        Mood and Affect: Mood normal.        Speech: He is noncommunicative.        Behavior: Behavior is withdrawn.        Cognition and Memory: Cognition is impaired.        Judgment: Judgment is inappropriate.    Review of Systems  Constitutional: Negative.    Blood pressure 117/76, pulse 81, temperature 98.3 F (36.8 C), temperature source Oral, resp. rate 20, height 6' (1.829 m), weight 96 kg, SpO2 97%. Body mass index is 28.7 kg/m.   Medical Decision Making: Patient continues to require inpatient psychiatric admission. Patient will be transferred to [facility] on 01/24/2023.    Tyrone Jenkins,  PMHNP 01/23/2023, 6:20 PM

## 2023-01-23 NOTE — ED Notes (Addendum)
Upon rounding Pt noted to be awake, This RN asked Pt if he was ok and needing anything and Pt actually responded to this RN by asking "What time is it"? This RN made him aware that it was 0142 in the morning and he replied back saying "It's 1:42", and this RN replied "yes" and he rolled over to his left side to go back to sleep. No further needs at this time. Call bell remains in reach.

## 2023-01-23 NOTE — ED Provider Notes (Signed)
Emergency Medicine Observation Re-evaluation Note  Tyrone Jenkins is a 42 y.o. male, seen on rounds today.  Pt initially presented to the ED for complaints of Altered Mental Status Currently, the patient is resting comfortably asleep.  Physical Exam  BP 121/81 (BP Location: Right Arm)   Pulse 66   Temp (!) 97.5 F (36.4 C) (Oral)   Resp 16   Ht 6' (1.829 m)   Wt 96 kg   SpO2 98%   BMI 28.70 kg/m  Physical Exam General: Resting without agitation Cardiac: Not tachycardic on last vital signs Lungs: Symmetric rise and fall of chest wall patient is asleep without acute respiratory distress Psych: No agitation at this time  ED Course / MDM  EKG:EKG Interpretation Date/Time:  Thursday January 20 2023 13:56:53 EDT Ventricular Rate:  99 PR Interval:  134 QRS Duration:  80 QT Interval:  340 QTC Calculation: 436 R Axis:   60  Text Interpretation: Normal sinus rhythm Normal ECG When compared with ECG of 07-Jan-2014 17:38, PREVIOUS ECG IS PRESENT Confirmed by Gwyneth Sprout ([id number]) on 01/20/2023 2:03:18 PM  I have reviewed the labs performed to date as well as medications administered while in observation.  Recent changes in the last 24 hours include none reported by nursing.  Plan  Current plan is for awaiting placement.    Lesly Pontarelli, Canary Brim, MD 01/23/23 [phone]

## 2023-01-23 NOTE — ED Notes (Signed)
Patient alert and oriented at this time. Ambulating in room independently. Patient able to verbalize needs at this time, and verbalize concerns about plan of care and potential discharge. Shared information with patient about the process of being here and patient verbalizes understanding. No needs voiced at this time.

## 2023-01-24 MED ORDER — LORAZEPAM 1 MG PO TABS
2.0000 mg | ORAL_TABLET | Freq: Two times a day (BID) | ORAL | Status: DC
  Administered 2023-01-24: 2 mg via ORAL
  Filled 2023-01-24: qty 2

## 2023-01-24 NOTE — Progress Notes (Signed)
Pt has been accepted to [facility] on 01/24/23 Bed assignment: Unit 200 can arrive after 8 pm   Pt meets inpatient criteria per: Dahlia Byes NP  Attending Physician will ZO:XWRU [place] MD  Report can be called to: [phone]  Pt can arrive after 8 PM today   Care Team Notified: [facility]: Dahlia Byes NP, Lum Babe RN, Rona Ravens RN   [place] Drayke Grabel LCSW-A   01/24/2023 2:26 PM

## 2023-01-24 NOTE — ED Notes (Signed)
Patient off unit to [facility] per provider. Patient alert, calm. Cooperative, no s/s of distress. Patient discharge information given to [facility] for facility. Patient ambulatory off unit, escorted and transported by  [employer]

## 2023-01-24 NOTE — ED Provider Notes (Signed)
Emergency Medicine Observation Re-evaluation Note  Tyrone Jenkins is a 42 y.o. male, seen on rounds today.  Pt initially presented to the ED for complaints of Altered Mental Status Currently, the patient is resting.  Physical Exam  BP 104/71 (BP Location: Right Arm)   Pulse 80   Temp 98.7 F (37.1 C)   Resp 15   Ht 6' (1.829 m)   Wt 96 kg   SpO2 97%   BMI 28.70 kg/m  Physical Exam General: NAD   ED Course / MDM  EKG:EKG Interpretation Date/Time:  Thursday January 20 2023 13:56:53 EDT Ventricular Rate:  99 PR Interval:  134 QRS Duration:  80 QT Interval:  340 QTC Calculation: 436 R Axis:   60  Text Interpretation: Normal sinus rhythm Normal ECG When compared with ECG of 07-Jan-2014 17:38, PREVIOUS ECG IS PRESENT Confirmed by Gwyneth Sprout ([id number]) on 01/20/2023 2:03:18 PM  I have reviewed the labs performed to date as well as medications administered while in observation.  Recent changes in the last 24 hours include no acute events reported.  Plan  Current plan is for likely placement 11/4.    Wynetta Fines, MD 01/24/23 [phone]

## 2023-01-24 NOTE — ED Notes (Signed)
Patient has been resting in bed, breaths equal and unlabored.  Medication compliant this AM. Patient ate breakfast.  No suicidal or homicidal ideation noted.

## 2023-01-24 NOTE — Progress Notes (Signed)
LCSW Progress Note  [id number]   Tyrone Jenkins  01/24/2023  1:43 PM  Description:   Inpatient Psychiatric Referral  Patient was recommended inpatient per Dahlia Byes NP There are no available beds at [facility], per [facility] Rona Ravens RN. Patient was referred to the following out of network facilities:   Destination  Service Provider Address Phone Fax  [facility]  [address] [place], [place] [address] [phone]  [facility]  [address] [place] [address] [phone]  [facility] [address]  [address], [place] [address] [phone]  [facility]  [address] [place], [place] [address] [phone]  [facility]  [address] [place] [address] [phone]  [facility]  [address] [place], [place] [address] [phone]  [facility]  [address] [place] [address] [phone]  [facility]  [address] [place] [address] [phone]  [facility]  [address], [place] [address] [phone]  [facility]  [address] [place] [address] [phone]  [facility]  [address] [place] [address] [phone]  [facility]  [address] [place] [address] [phone]  [facility]  [address], [place] [address] [phone]  [facility]  [place] [phone]  [facility] EFAX  [address] [place], [place] [phone]  [facility]  [address], [place] [address] [phone]   [facility]  [address], [place] [address] [phone]  [facility]  [address] [place] [address] [phone]  [facility]  [address], [place] [address] [phone]  [facility]  [address] [place] [address] [phone]  [facility]  [address] [place] [address] [phone]  [facility]  [place] [phone]  [facility]  [address] [place] [address] [phone]  [facility] Patient Placement  [facility], [place] [phone]  [facility]  [address] [place] [address] [place] [address] [phone]      Situation ongoing, CSW to continue following and update chart as more information becomes available.      [name] LCSW-A   01/24/2023 1:43 PM

## 2023-01-24 NOTE — ED Notes (Addendum)
Called [facility] this morning and spoke to [place], Charity fundraiser. She stated that they still had beds but a new referral packet would need to be sent over via fax. I contacted social worker to inform them.     Information needs to be faxed to [phone] to the att. Of [facility]

## 2023-02-09 ENCOUNTER — Encounter (HOSPITAL_COMMUNITY): Payer: Self-pay | Admitting: Physician Assistant

## 2023-02-09 ENCOUNTER — Ambulatory Visit (HOSPITAL_COMMUNITY): Payer: MEDICAID | Admitting: Physician Assistant

## 2023-02-09 VITALS — BP 122/88 | HR 76 | Temp 98.1°F | Ht 72.0 in | Wt 212.6 lb

## 2023-02-09 DIAGNOSIS — F209 Schizophrenia, unspecified: Secondary | ICD-10-CM

## 2023-02-09 DIAGNOSIS — F061 Catatonic disorder due to known physiological condition: Secondary | ICD-10-CM

## 2023-02-09 MED ORDER — LORAZEPAM 0.5 MG PO TABS: 0.5000 mg | ORAL_TABLET | Freq: Two times a day (BID) | ORAL | 0 refills | Status: AC

## 2023-02-09 MED ORDER — ARIPIPRAZOLE 10 MG PO TABS: 10.0000 mg | ORAL_TABLET | Freq: Every day | ORAL | 0 refills | Status: AC

## 2023-02-09 NOTE — Progress Notes (Signed)
Psychiatric Initial Adult Assessment   Patient Identification: Tyrone Jenkins MRN:  [id number] Date of Evaluation:  02/09/2023 Referral Source: Walk-in Chief Complaint:   Chief Complaint  Patient presents with   Medication Refill   Other    Walk-in   Visit Diagnosis:    ICD-10-CM   1. Schizophrenia, unspecified type (HCC)  F20.9 ARIPiprazole (ABILIFY) 10 MG tablet    2. Catatonia  F06.1 LORazepam (ATIVAN) 0.5 MG tablet     History of Present Illness:    Tyrone Jenkins is a 42 year old male with a past psychiatric history significant for schizophrenia (unspecified type), major depressive disorder, and catatonia who presents to [facility] as a walk-in and for medication refill.  Patient reports that he was recently assessed at [facility] ED.  Per chart review, patient presented to [name] ED via EMS after his family found him standing in the hallway and not moving for 2 hours.  When his family found him, patient was unable to follow commands or answer any questions.  When EMS arrived, patient was also not able to respond to the questions or speak during transport.  It was determined that patient recently had a similar episode that required psychiatric admission for his catatonia on 9//2024.  While admitted to [facility], patient was given Ativan via IV.  Patient was subsequently transferred over to [facility] after being accepted.  Patient reports that he was transferred over to [facility] after being assessed at [facility] ED.  He reports that he spent roughly 8 days at [facility].  He reports that he was discharged from [facility] on the following psychiatric medications: Abilify 10 mg daily and lorazepam 0.5 mg 2 times daily.  Following discharge from [facility], patient was referred to [facility] for outpatient psychiatry.  He reports that he was able to get a follow-up appointment with [facility] scheduled for  02/21/2023; however, patient was only able to receive a 2-week supply of his medication.  Patient reports that he is about to run out at this time.  Patient went over the events that had him hospitalized.  He reports that he was found in a catatonic state by his family members and was subsequently transferred over to [facility] ED.  Patient reports that he has a past history of catatonia.  Patient reports that he has been diagnosed with the following psychiatric diagnoses: Schizophrenia and major depressive disorder.  He reports that he was diagnosed with schizophrenia roughly 10 years ago, but is entirely unsure of the timeline.  Patient reports that he has been on other psychiatric medications in the past but is only able to remember Prozac.  Patient denies depression stating that he is excited about new developments and opportunities coming his way.  Patient endorses anxiety and rates his anxiety as 6 out of 10.  Patient states that he tries to do a lot of self care to avoid his anxiety.  Triggers to his anxiety include stress.  Patient denies any stressors at this time.  Patient denies recent instances of panic attacks but states that he has had panic attacks in the past.  Patient endorses a past history of suicide attempt stating that he once tried to slit his wrists.  A PHQ-9 screen was performed with the patient scoring a 9.  A GAD-7 screen was also performed with the patient scoring a 7.  Patient is alert and oriented x 4, calm, cooperative, and fully engaged  in conversation during the encounter.  Patient endorses okay mood.  Patient denies suicidal or homicidal ideations.  He further denies auditory or visual hallucinations and does not appear to be responding to internal/external stimuli.  Patient denies paranoia or delusional thoughts.  Patient endorses good sleep and receives on average 8 to 9 hours of sleep per night.  Patient endorses good appetite and eats on average 3 meals per day.  Patient  denies alcohol consumption.  Patient denies tobacco use but states that he used to be a former smoker.  Patient denies illicit drug use.  Associated Signs/Symptoms: Depression Symptoms:  depressed mood, anhedonia, psychomotor agitation, psychomotor retardation, difficulty concentrating, hopelessness, impaired memory, anxiety, (Hypo) Manic Symptoms:  Distractibility, Flight of Ideas, Anxiety Symptoms:  Excessive Worry, Social Anxiety, Psychotic Symptoms:   Patient denies PTSD Symptoms: Negative  Past Psychiatric History:  Patient has a past psychiatric history significant for schizophrenia (unspecified type) and catatonia.  Patient was recently hospitalized at [facility].  Prior to being transferred to that facility, patient was assessed at [facility] ED for catatonia.  Patient endorses a past history of suicide attempt stating that he once tried to slit his wrist.  Patient reports that he attempted several years ago.  Patient denies a past history of homicide attempt  Previous Psychotropic Medications: Yes , prior to being placed on Abilify and lorazepam, patient had been managed on Prozac.  The patient is unsure of any of the other antidepressants or antipsychotics he had been on in the past.  Substance Abuse History in the last 12 months:  No.  Consequences of Substance Abuse: Negative  Past Medical History:  Past Medical History:  Diagnosis Date   Catatonia    Cellulitis 01/2019   both wrist   Schizophrenia [facility])     Past Surgical History:  Procedure Laterality Date   INCISION AND DRAINAGE OF WOUND Left 02/09/2019   Procedure: debridement of left wrist wound with Acell placement;  Surgeon: Allena Napoleon, MD;  Location: [facility] OR;  Service: Plastics;  Laterality: Left;  1 hour    Family Psychiatric History:  Patient is unsure of family history of psychiatric illness  Family history of suicide attempt: Patient denies Family history of  homicide attempt: Patient denies Family history of substance abuse: Patient is unsure  Family History: History reviewed. No pertinent family history.  Social History:   Social History   Socioeconomic History   Marital status: Single    Spouse name: Not on file   Number of children: 2   Years of education: Not on file   Highest education level: Not on file  Occupational History   Not on file  Tobacco Use   Smoking status: Former   Smokeless tobacco: Never  Vaping Use   Vaping status: Never Used  Substance and Sexual Activity   Alcohol use: Yes    Comment: social   Drug use: Never   Sexual activity: Not on file  Other Topics Concern   Not on file  Social History Narrative   ** Merged History Encounter **       Social Determinants of Health   Financial Resource Strain: Not on file  Food Insecurity: Patient Unable To Answer (07/16/2022)   Received from [facility]   Hunger Vital Sign    Worried About Running Out of Food in the Last Year: Patient unable to answer    Ran Out of Food in the Last Year: Patient unable to answer  Transportation Needs: Patient Unable To Answer (07/16/2022)   Received from [facility]    Lack of Transportation (Medical): Patient unable to answer    Lack of Transportation (Non-Medical): Patient unable to answer  Physical Activity: Not on file  Stress: Not on file  Social Connections: Unknown (08/04/2021)   Received from [facility]   Social Network    Social Network: Not on file    Additional Social History:  Patient endorses social support.  Patient reports that he has 2 children of his own (18 and 13 years of age).  Patient endorses having.  Patient is currently unemployed but states that he has an interview for a job tomorrow.  Patient denies a past history of military experience.  Patient denies a past history of prison or jail time.  Patient has completed some college courses.  Patient denies access to  weapons at this time.  Allergies:   Allergies  Allergen Reactions   Haloperidol Other (See Comments)    Shaking    Metabolic Disorder Labs: Lab Results  Component Value Date   HGBA1C 5.3 02/11/2019   MPG 105.41 02/11/2019   No results found for: "PROLACTIN" Lab Results  Component Value Date   CHOL  06/18/2010    168 (NOTE) ATP III Classification:      < 200        mg/dL        Desirable     782 - 239     mg/dL        Borderline High     >= 240        mg/dL        High    TRIG 956 02/12/2019   HDL 32 (L) 06/18/2010   CHOLHDL 5.3 06/18/2010   VLDL 45 (H) 06/18/2010   LDLCALC  06/18/2010    91 (NOTE)  Total Cholesterol/HDL Ratio:CHD Risk                       Coronary Heart Disease Risk Table                                       Men       Women         1/2 Average Risk              3.4        3.3             Average Risk              5.0         4.4          2X Average Risk              9.6        7.1          3X Average Risk             23.4       11.0 Use the calculated Patient Ratio above and the CHD Risk table  to determine the patient's CHD Risk. ATP III Classification (LDL):      < 100         mg/dL         Optimal     213 - 129     mg/dL  Near or Above Optimal     130 - 159     mg/dL         Borderline High     160 - 189     mg/dL         High      > 161        mg/dL         Very High    Lab Results  Component Value Date   TSH 1.270 06/18/2010    Therapeutic Level Labs: No results found for: "LITHIUM" No results found for: "CBMZ" No results found for: "VALPROATE"  Current Medications: Current Outpatient Medications  Medication Sig Dispense Refill   ARIPiprazole (ABILIFY) 10 MG tablet Take 1 tablet (10 mg total) by mouth at bedtime. 14 tablet 0   [START ON 02/15/2023] LORazepam (ATIVAN) 0.5 MG tablet Take 1 tablet (0.5 mg total) by mouth 2 (two) times daily. 28 tablet 0   acetaminophen (TYLENOL) 325 MG tablet Take 2 tablets (650 mg total) by mouth every 6  (six) hours as needed for mild pain or headache (or Fever >/= 101).     isosorbide-hydrALAZINE (BIDIL) 20-37.5 MG tablet Take 0.5 tablets by mouth 2 (two) times daily. (Patient not taking: Reported on 01/20/2023) 180 tablet 1   metoprolol succinate (TOPROL-XL) 25 MG 24 hr tablet Take 2 tablets (50 mg total) by mouth daily. Take with or immediately following a meal. (Patient not taking: Reported on 01/20/2023) 30 tablet 3   mirtazapine (REMERON) 7.5 MG tablet Take 7.5 mg by mouth at bedtime.     ZYRTEC ALLERGY 10 MG tablet Take 10 mg by mouth daily as needed for allergies or rhinitis.     No current facility-administered medications for this visit.    Musculoskeletal: Strength & Muscle Tone: within normal limits Gait & Station: normal Patient leans: N/A  Psychiatric Specialty Exam: Review of Systems  Psychiatric/Behavioral:  Negative for decreased concentration, dysphoric mood, hallucinations, self-injury, sleep disturbance and suicidal ideas. The patient is nervous/anxious. The patient is not hyperactive.     Blood pressure 122/88, pulse 76, temperature 98.1 F (36.7 C), temperature source Oral, height 6' (1.829 m), weight 212 lb 9.6 oz (96.4 kg), SpO2 98%.Body mass index is 28.83 kg/m.  General Appearance: Casual  Eye Contact:  Good  Speech:  Clear and Coherent and Normal Rate  Volume:  Normal  Mood:  Anxious  Affect:  Appropriate  Thought Process:  Coherent, Goal Directed, and Descriptions of Associations: Intact  Orientation:  Full (Time, Place, and Person)  Thought Content:  WDL  Suicidal Thoughts:  No  Homicidal Thoughts:  No  Memory:  Immediate;   Good Recent;   Good Remote;   Good  Judgement:  Good  Insight:  Good  Psychomotor Activity:  Restlessness  Concentration:  Concentration: Good and Attention Span: Good  Recall:  Good  Fund of Knowledge:Good  Language: Good  Akathisia:  No  Handed:  Right  AIMS (if indicated):  not done  Assets:  Communication  Skills Desire for Improvement Housing Intimacy Social Support Transportation  ADL's:  Intact  Cognition: WNL  Sleep:  Good   Screenings: GAD-7    Flowsheet Row Office Visit from 02/09/2023 in [facility]  Total GAD-7 Score 7      PHQ2-9    Flowsheet Row Office Visit from 02/09/2023 in [facility]  PHQ-2 Total Score 4  PHQ-9 Total Score 9  Flowsheet Row Office Visit from 02/09/2023 in [facility] ED from 01/20/2023 in [facility] Emergency Department at [facility]  C-SSRS RISK CATEGORY Moderate Risk No Risk       Assessment and Plan:   Tyrone Jenkins is a 42 year old male with a past psychiatric history significant for schizophrenia (unspecified type), major depressive disorder, and catatonia who presents to [facility] as a walk-in and for medication refill.  Patient was recently assessed at [facility] and transferred over to [facility] due to altered mental status and being in a catatonic state.  Patient was discharged from the facility after spending 8 days and was referred over to [facility] for outpatient psychiatry.  Patient states that he was given an appointment for 02/21/2023; however, he states that he was only given a 2-week supply of his medications and he is about to run out.  Patient was discharged from [facility] on the following psychiatric medications: Abilify 10 mg daily and lorazepam 0.5 mg 2 times daily.  Patient is requesting refills on his medications to last him until his next appointment with [facility].  Provider to bridge patient until his next appointment scheduled for 02/21/2023.  Patient's medications to be e-prescribed to pharmacy of choice.  Patient does not appear to be in acute distress during the assessment.  He endorses good mood and denies overt depressive symptoms.  Patient endorses some  anxiety but states that this is manageable at this time.  Patient does not appear to be responding to internal/external stimuli and denies auditory or visual hallucinations.  Collaboration of Care: Medication Management AEB provider managing patient's psychiatric medications and Psychiatrist AEB patient has a follow-up appointment with [facility] for his mental health.  Patient/Guardian was advised Release of Information must be obtained prior to any record release in order to collaborate their care with an outside provider. Patient/Guardian was advised if they have not already done so to contact the registration department to sign all necessary forms in order for [place] to release information regarding their care.   Consent: Patient/Guardian gives verbal consent for treatment and assignment of benefits for services provided during this visit. Patient/Guardian expressed understanding and agreed to proceed.   1. Schizophrenia, unspecified type (HCC)  - ARIPiprazole (ABILIFY) 10 MG tablet; Take 1 tablet (10 mg total) by mouth at bedtime.  Dispense: 14 tablet; Refill: 0  2. Catatonia  - LORazepam (ATIVAN) 0.5 MG tablet; Take 1 tablet (0.5 mg total) by mouth 2 (two) times daily.  Dispense: 28 tablet; Refill: 0  Patient to follow up with [facility] on December 2nd Provider spent a total of 41 minutes with the patient/reviewing patient's chart  Meta Hatchet, PA 11/20/20245:50 PM
